# Patient Record
Sex: Male | Born: 1970 | Race: White | Hispanic: No | Marital: Married | Smoking: Former smoker
Health system: Southern US, Community
[De-identification: ages and names within clinical notes are randomized; demographics above are authoritative.]

## PROBLEM LIST (undated history)

## (undated) DIAGNOSIS — G4730 Sleep apnea, unspecified: Secondary | ICD-10-CM

## (undated) DIAGNOSIS — D689 Coagulation defect, unspecified: Secondary | ICD-10-CM

## (undated) DIAGNOSIS — Z8371 Family history of colonic polyps: Secondary | ICD-10-CM

## (undated) DIAGNOSIS — Z8619 Personal history of other infectious and parasitic diseases: Secondary | ICD-10-CM

## (undated) DIAGNOSIS — E669 Obesity, unspecified: Secondary | ICD-10-CM

## (undated) DIAGNOSIS — I82409 Acute embolism and thrombosis of unspecified deep veins of unspecified lower extremity: Secondary | ICD-10-CM

## (undated) DIAGNOSIS — G5600 Carpal tunnel syndrome, unspecified upper limb: Secondary | ICD-10-CM

## (undated) DIAGNOSIS — Z83719 Family history of colon polyps, unspecified: Secondary | ICD-10-CM

## (undated) DIAGNOSIS — K219 Gastro-esophageal reflux disease without esophagitis: Secondary | ICD-10-CM

## (undated) DIAGNOSIS — E785 Hyperlipidemia, unspecified: Secondary | ICD-10-CM

## (undated) DIAGNOSIS — K760 Fatty (change of) liver, not elsewhere classified: Secondary | ICD-10-CM

## (undated) HISTORY — DX: Sleep apnea, unspecified: G47.30

## (undated) HISTORY — DX: Family history of colon polyps, unspecified: Z83.719

## (undated) HISTORY — DX: Family history of colonic polyps: Z83.71

## (undated) HISTORY — PX: COLONOSCOPY: SHX174

## (undated) HISTORY — DX: Coagulation defect, unspecified: D68.9

## (undated) HISTORY — DX: Hyperlipidemia, unspecified: E78.5

## (undated) HISTORY — DX: Personal history of other infectious and parasitic diseases: Z86.19

## (undated) HISTORY — DX: Fatty (change of) liver, not elsewhere classified: K76.0

## (undated) HISTORY — PX: PILONIDAL CYST EXCISION: SHX744

## (undated) HISTORY — PX: LUMBAR DISC SURGERY: SHX700

## (undated) HISTORY — DX: Acute embolism and thrombosis of unspecified deep veins of unspecified lower extremity: I82.409

## (undated) HISTORY — DX: Obesity, unspecified: E66.9

## (undated) HISTORY — DX: Gastro-esophageal reflux disease without esophagitis: K21.9

## (undated) HISTORY — DX: Carpal tunnel syndrome, unspecified upper limb: G56.00

---

## 2005-02-06 ENCOUNTER — Ambulatory Visit: Payer: Self-pay | Admitting: Internal Medicine

## 2005-02-27 ENCOUNTER — Ambulatory Visit: Payer: Self-pay | Admitting: Internal Medicine

## 2006-01-23 ENCOUNTER — Encounter: Admission: RE | Admit: 2006-01-23 | Discharge: 2006-01-23 | Payer: Self-pay | Admitting: Family Medicine

## 2007-06-27 ENCOUNTER — Encounter: Admission: RE | Admit: 2007-06-27 | Discharge: 2007-06-27 | Payer: Self-pay | Admitting: Family Medicine

## 2008-03-11 IMAGING — CR DG LUMBAR SPINE COMPLETE 4+V
5 series · 5 of 5 positions shown · non-contrast
Comparison: None
COMPARISON: None

CLINICAL DATA: Mid to lower back pain for 2 months. No injury. Worse with
eating.

THORACIC SPINE - 4 VIEW
CLINICAL DATA: Same as above
LUMBAR SPINE - 5 VIEW

[t l-spine a.p.]
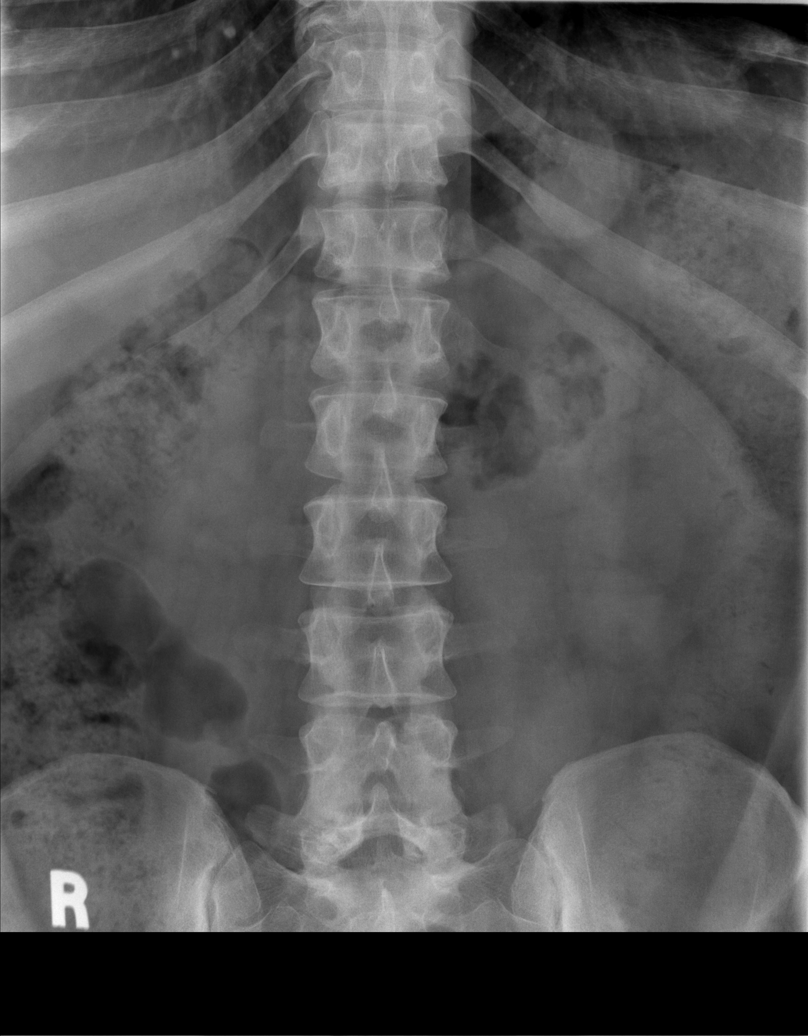

[t l-spine oblique exposure (1 of 2)]
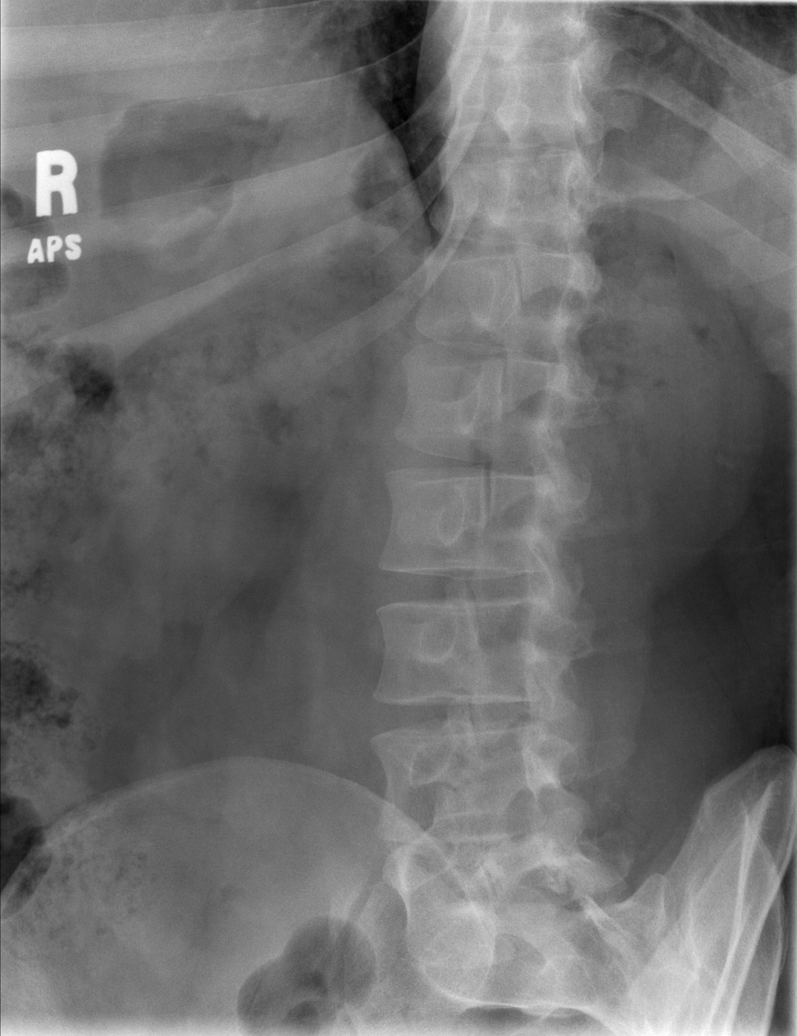

[t l-spine oblique exposure (2 of 2)]
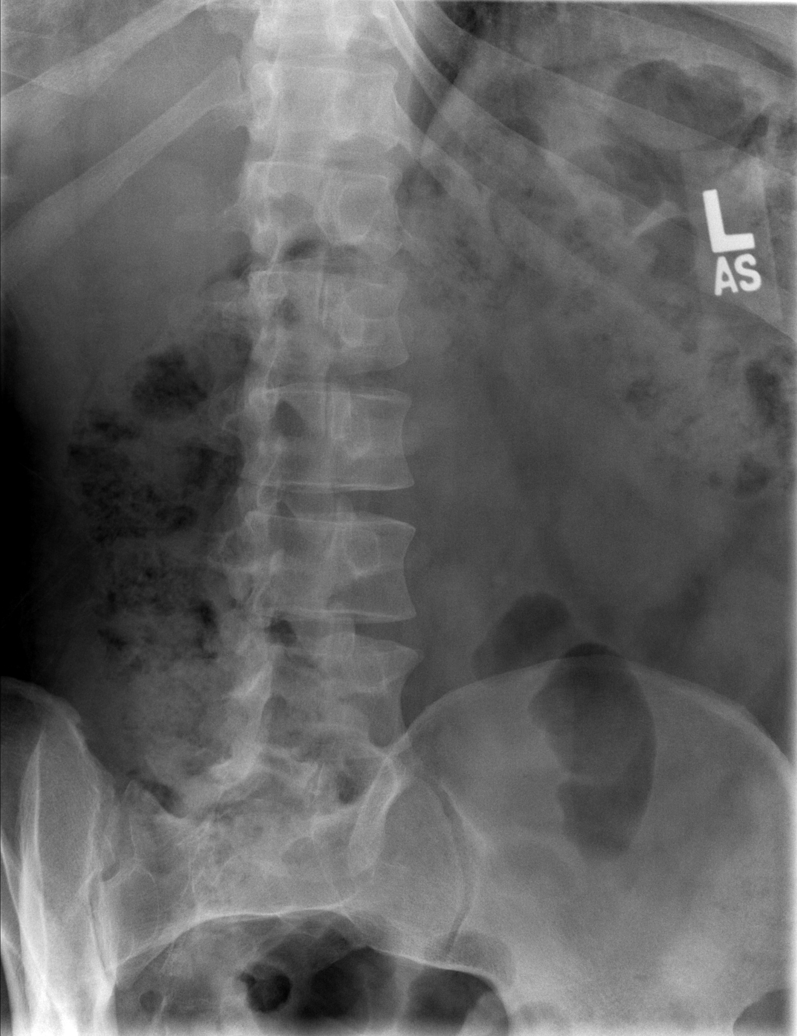

[t l-spine lat]
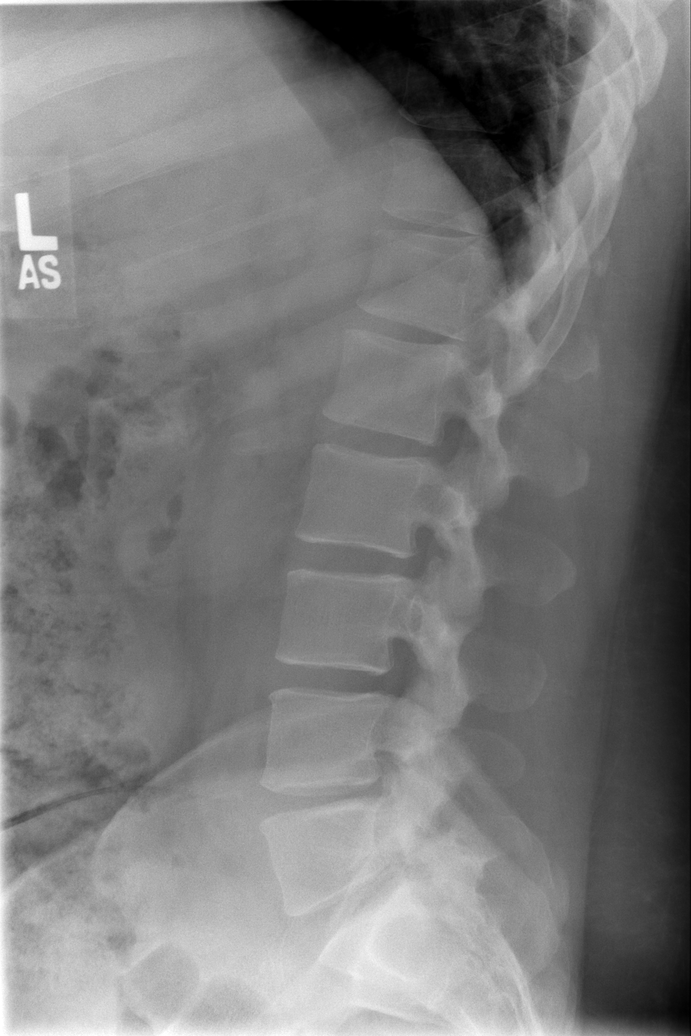

[t l-spine l5-s1 spot *]
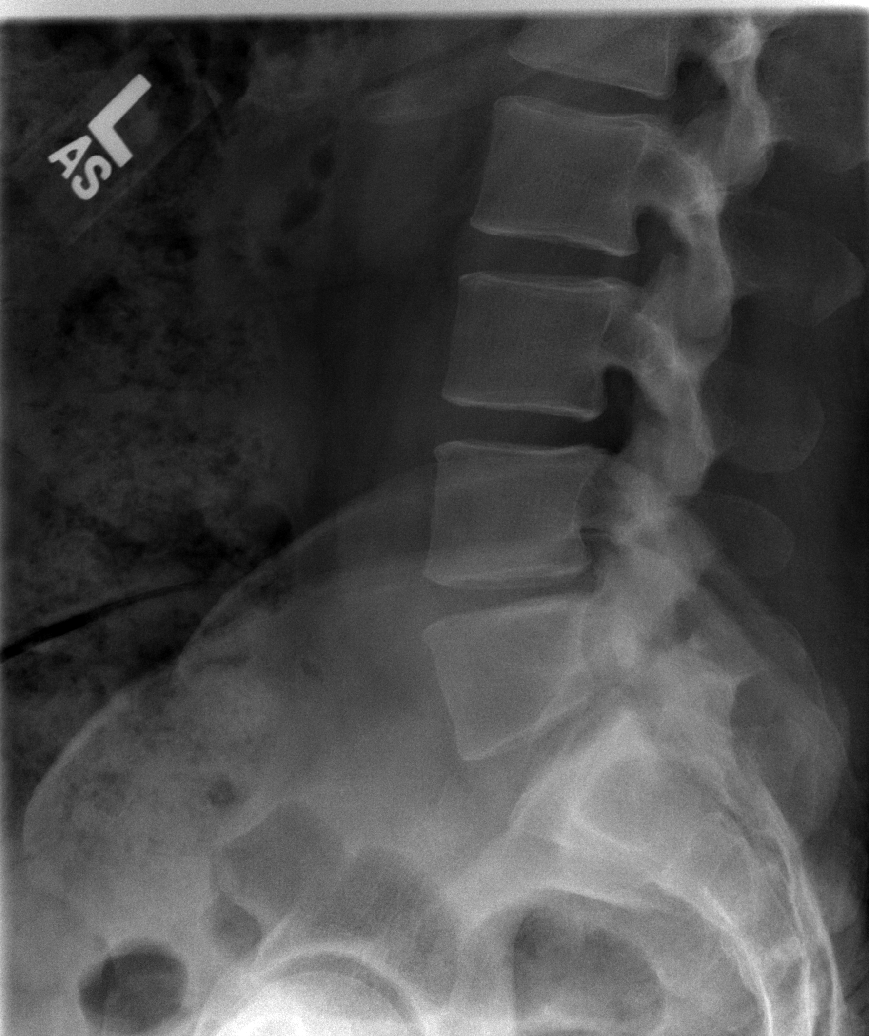

[5 of 5 positions shown; findings below may reference images not displayed]

FINDINGS: Minimal S-shaped spinal curvature. Paraspinous contours . prominent
costovertebral junctions at T9 and T10 on the right likely relate to
degenerative change.

Minimally limited evaluation of the upper thoracic spine due to patient size. No
gross compression deformity through approximately the T5 level. Maintenance of
vertebral body height from T5 through T12. Mild upper lumbar degenerative disc
disease.

IMPRESSION

Mild degenerative disc disease without acute finding. Please note this is part
of a multistudy report.  Please see remainder of report.
FINDINGS: Six nonrib-bearing lumbar type vertebral bodies. Labeled T12 through
L5 for purposes of this study. Grade 1 anterolisthesis of L5 and S1. Maintenance
of vertebral body height and alignment. Degenerative disc disease at both L4-L5
and L5-S1 is mild.

IMPRESSION

1. Degenerative disc disease and grade 1 L5-S1 anterolisthesis without acute
osseous finding.
Please note this is part of a multistudy report.  Please see remainder of
report.

## 2008-10-04 ENCOUNTER — Encounter: Admission: RE | Admit: 2008-10-04 | Discharge: 2008-10-04 | Payer: Self-pay | Admitting: Gastroenterology

## 2010-03-24 ENCOUNTER — Encounter (INDEPENDENT_AMBULATORY_CARE_PROVIDER_SITE_OTHER): Payer: Self-pay | Admitting: *Deleted

## 2010-07-12 ENCOUNTER — Encounter: Admission: RE | Admit: 2010-07-12 | Discharge: 2010-07-12 | Payer: Self-pay | Admitting: Family Medicine

## 2010-11-03 ENCOUNTER — Encounter (INDEPENDENT_AMBULATORY_CARE_PROVIDER_SITE_OTHER): Payer: Self-pay

## 2010-11-07 ENCOUNTER — Ambulatory Visit
Admission: RE | Admit: 2010-11-07 | Discharge: 2010-11-07 | Payer: Self-pay | Source: Home / Self Care | Attending: Internal Medicine | Admitting: Internal Medicine

## 2010-11-07 ENCOUNTER — Encounter (INDEPENDENT_AMBULATORY_CARE_PROVIDER_SITE_OTHER): Payer: Self-pay

## 2010-11-21 NOTE — Letter (Signed)
Summary: Colonoscopy Letter  [facility]  [address] [place], [place] [address]   Phone: [phone]  Fax: [phone]      March 24, 2010 MRN: [id number]   Jerry Eaton [address] [place], [place]  [address]   Dear Jerry Eaton,   According to your medical record, it is time for you to schedule a Colonoscopy. The American Cancer Society recommends this procedure as a method to detect early colon cancer. Patients with a family history of colon cancer, or a personal history of colon polyps or inflammatory bowel disease are at increased risk.  This letter has been generated based on the recommendations made at the time of your procedure. If you feel that in your particular situation this may no longer apply, please contact our office.  Please call our office at [phone] to schedule this appointment or to update your records at your earliest convenience.  Thank you for cooperating with [place] to provide you with the very best care possible.   Sincerely,  Wilhemina Bonito. Marina Goodell, M.D.  [facility] [phone]

## 2010-11-23 NOTE — Miscellaneous (Signed)
Summary: Lec previsit  Clinical Lists Changes  Medications: Added new medication of MOVIPREP 100 GM  SOLR (PEG-KCL-NACL-NASULF-NA ASC-C) As per prep instructions. - Signed Rx of MOVIPREP 100 GM  SOLR (PEG-KCL-NACL-NASULF-NA ASC-C) As per prep instructions.;  #1 x 0;  Signed;  Entered by: Ulis Rias RN;  Authorized by: Hilarie Fredrickson MD;  Method used: Electronically to [id number] [address] [place], [place], [place]  [address], Ph: [phone], Fax: [phone] Observations: Added new observation of NKA: T (11/07/2010 8:39)    Prescriptions: MOVIPREP 100 GM  SOLR (PEG-KCL-NACL-NASULF-NA ASC-C) As per prep instructions.  #1 x 0   Entered by:   Ulis Rias RN   Authorized by:   Hilarie Fredrickson MD   Signed by:   Ulis Rias RN on 11/07/2010   Method used:   Electronically to        [facility] [phone]* (retail)       [address] [place], [place]  [address]       Ph: [phone]       Fax: [phone]   RxID:   [id number]

## 2010-11-23 NOTE — Letter (Signed)
Summary: Diabetic Instructions  [facility]  [address] [place], [place] [address]   Phone: [phone]  Fax: [phone]    Jerry Eaton [birth year] MRN: [id number]   _  _   ORAL DIABETIC MEDICATION INSTRUCTIONS  The day before your procedure:   Take your diabetic pill as you do normally  The day of your procedure:   Do not take your diabetic pill    We will check your blood sugar levels during the admission process and again in Recovery before discharging you home  ________________________________________________________________________  _  _   INSULIN (LONG ACTING) MEDICATION INSTRUCTIONS (Lantus, NPH, 70/30, Humulin, Novolin-N)   The day before your procedure:   Take  your regular evening dose    The day of your procedure:   Do not take your morning dose    _  _   INSULIN (SHORT ACTING) MEDICATION INSTRUCTIONS (Regular, Humulog, Novolog)   The day before your procedure:   Do not take your evening dose   The day of your procedure:   Do not take your morning dose   _  _   INSULIN PUMP MEDICATION INSTRUCTIONS  We will contact the physician managing your diabetic care for written dosage instructions for the day before your procedure and the day of your procedure.  Once we have received the instructions, we will contact you.

## 2010-11-23 NOTE — Letter (Signed)
Summary: [facility] Instructions  [facility]  [address] [place], [place] [address]   Phone: [phone]  Fax: [phone]       Jerry Eaton    June 13, 1971    MRN: [id number]        Procedure Day Dorna Bloom:  Farrell Ours  11/24/10     Arrival Time:  9:00AM     Procedure Time:  10:00AM     [facility] of Procedure:                    [facility]                      PREPARATION FOR COLONOSCOPY WITH MOVIPREP   Starting 5 days prior to your procedure 11/19/10 do not eat nuts, seeds, popcorn, corn, beans, peas,  salads, or any raw vegetables.  Do not take any fiber supplements (e.g. Metamucil, Citrucel, and Benefiber).  THE DAY BEFORE YOUR PROCEDURE         DATE: 2/212  DAY: THURSDAY  1.  Drink clear liquids the entire day-NO SOLID FOOD  2.  Do not drink anything colored red or purple.  Avoid juices with pulp.  No orange juice.  3.  Drink at least 64 oz. (8 glasses) of fluid/clear liquids during the day to prevent dehydration and help the prep work efficiently.  CLEAR LIQUIDS INCLUDE: Water Jello Ice Popsicles Tea (sugar ok, no milk/cream) Powdered fruit flavored drinks Coffee (sugar ok, no milk/cream) Gatorade Juice: apple, white grape, white cranberry  Lemonade Clear bullion, consomm, broth Carbonated beverages (any kind) Strained chicken noodle soup Hard Candy                             4.  In the morning, mix first dose of MoviPrep solution:    Empty 1 Pouch A and 1 Pouch B into the disposable container    Add lukewarm drinking water to the top line of the container. Mix to dissolve    Refrigerate (mixed solution should be used within 24 hrs)  5.  Begin drinking the prep at 5:00 p.m. The MoviPrep container is divided by 4 marks.   Every 15 minutes drink the solution down to the next mark (approximately 8 oz) until the full liter is complete.   6.  Follow completed prep with 16 oz of clear liquid of your choice (Nothing red  or purple).  Continue to drink clear liquids until bedtime.  7.  Before going to bed, mix second dose of MoviPrep solution:    Empty 1 Pouch A and 1 Pouch B into the disposable container    Add lukewarm drinking water to the top line of the container. Mix to dissolve    Refrigerate  THE DAY OF YOUR PROCEDURE      DATE: 11/24/10   DAY: FRIDAY  Beginning at 5:00AM (5 hours before procedure):         1. Every 15 minutes, drink the solution down to the next mark (approx 8 oz) until the full liter is complete.  2. Follow completed prep with 16 oz. of clear liquid of your choice.    3. You may drink clear liquids until 8:00AM (2 HOURS BEFORE PROCEDURE).   MEDICATION INSTRUCTIONS  Unless otherwise instructed, you should take regular prescription medications with a small sip of water   as early as possible the morning of  your procedure.         OTHER INSTRUCTIONS  You will need a responsible adult at least 38 years of age to accompany you and drive you home.   This person must remain in the waiting room during your procedure.  Wear loose fitting clothing that is easily removed.  Leave jewelry and other valuables at home.  However, you may wish to bring a book to read or  an iPod/MP3 player to listen to music as you wait for your procedure to start.  Remove all body piercing jewelry and leave at home.  Total time from sign-in until discharge is approximately 2-3 hours.  You should go home directly after your procedure and rest.  You can resume normal activities the  day after your procedure.  The day of your procedure you should not:   Drive   Make legal decisions   Operate machinery   Drink alcohol   Return to work  You will receive specific instructions about eating, activities and medications before you leave.    The above instructions have been reviewed and explained to me by   Ulis Rias RN  November 07, 2010 8:57 AM     I fully understand and can  verbalize these instructions _____________________________ Date _________

## 2010-11-24 ENCOUNTER — Other Ambulatory Visit (AMBULATORY_SURGERY_CENTER): Payer: BC Managed Care – PPO | Admitting: Internal Medicine

## 2010-11-24 ENCOUNTER — Ambulatory Visit: Admit: 2010-11-24 | Payer: Self-pay | Admitting: Internal Medicine

## 2010-11-24 ENCOUNTER — Other Ambulatory Visit: Payer: Self-pay | Admitting: Internal Medicine

## 2010-11-24 DIAGNOSIS — Z86010 Personal history of colonic polyps: Secondary | ICD-10-CM

## 2010-11-24 DIAGNOSIS — D126 Benign neoplasm of colon, unspecified: Secondary | ICD-10-CM

## 2010-11-24 DIAGNOSIS — Z1211 Encounter for screening for malignant neoplasm of colon: Secondary | ICD-10-CM

## 2010-11-24 DIAGNOSIS — K648 Other hemorrhoids: Secondary | ICD-10-CM

## 2010-11-24 LAB — GLUCOSE, CAPILLARY: Glucose-Capillary: 164 mg/dL — ABNORMAL HIGH (ref 70–99)

## 2010-11-28 ENCOUNTER — Encounter: Payer: Self-pay | Admitting: Internal Medicine

## 2010-12-07 NOTE — Letter (Signed)
Summary: Patient Notice- Polyp Results  [facility]  [address] [place], [place] [address]   Phone: [phone]  Fax: [phone]        November 28, 2010 MRN: [id number]    Jerry Eaton [address] [place], [place]  [address]    Dear [name],  I am pleased to inform you that the colon polyp(s) removed during your recent colonoscopy was (were) found to be benign (no cancer detected) upon pathologic examination.  I recommend you have a repeat colonoscopy examination in 5 years to look for recurrent polyps, as having colon polyps increases your risk for having recurrent polyps or even colon cancer in the future.  Should you develop new or worsening symptoms of abdominal pain, bowel habit changes or bleeding from the rectum or bowels, please schedule an evaluation with either your primary care physician or with me.  Additional information/recommendations:  __ No further action with gastroenterology is needed at this time. Please      follow-up with your primary care physician for your other healthcare      needs.    Please call us if you are having persistent problems or have questions about your condition that have not been fully answered at this time.  Sincerely,  Hilarie Fredrickson MD  This letter has been electronically signed by your physician.  Appended Document: Patient Notice- Polyp Results LETTER MAILED

## 2010-12-07 NOTE — Procedures (Signed)
Summary: Colonoscopy   Colonoscopy  Procedure date:  11/24/2010  Findings:      Location:  [facility].   COLONOSCOPY PROCEDURE REPORT  PATIENT:  Jerry Eaton, Jerry Eaton  MR#:  [id number] BIRTHDATE:   1970-11-07, 39 yrs. old   GENDER:   male ENDOSCOPIST:   Wilhemina Bonito. Eda Keys, MD REF. BY: Surveillance Program Recall, PROCEDURE DATE:  11/24/2010 PROCEDURE:  Colonoscopy with snare polypectomy x 3 ASA CLASS:   Class II INDICATIONS: history of pre-cancerous (adenomatous) colon polyps, surveillance and high-risk screening ; INDEX EXAM 02-2005 W/ SMALL TA'S MEDICATIONS:    Fentanyl 75 mcg IV, Versed 9 mg IV, Benadryl 25 mg IV  DESCRIPTION OF PROCEDURE:   After the risks benefits and alternatives of the procedure were thoroughly explained, informed consent was obtained.  Digital rectal exam was performed and revealed no abnormalities.   The LB CF-[id number] endoscope was introduced through the anus and advanced to the cecum, which was identified by both the appendix and ileocecal valve, without limitations.Time to cecum = 2:33 min.  The quality of the prep was excellent, using MoviPrep.  The instrument was then slowly withdrawn (time = 12:17 min) as the colon was fully examined. <<PROCEDUREIMAGES>>          <<OLD IMAGES>>  FINDINGS:  Three polyps (2mm, 5mm, and 7mm) were found in the descending colon. Polyps were snared without cautery. Retrieval was successful.  Otherwise normal colonoscopy without other polyps, masses, vascular ectasias, or inflammatory changes.   Retroflexed views in the rectum revealed small internal hemorrhoids.    The scope was then withdrawn from the patient and the procedure completed.  COMPLICATIONS:   None ENDOSCOPIC IMPRESSION:  1) Three polyps in the descending colon - removed  2) Otherwise nl colonoscopy   3) Internal hemorrhoids RECOMMENDATIONS:  1) Follow up colonoscopy in 3 years if all polyps adenomatous; otherwise 5  years  _______________________________ Wilhemina Bonito. Eda Keys, MD  CC: The Patient; Carilyn Goodpasture, [facility]     Appended Document: Colonoscopy recall     Procedures Next Due Date:    Colonoscopy: 11/2015

## 2011-03-14 ENCOUNTER — Other Ambulatory Visit: Payer: Self-pay | Admitting: Family Medicine

## 2011-03-14 DIAGNOSIS — K760 Fatty (change of) liver, not elsewhere classified: Secondary | ICD-10-CM

## 2011-04-09 ENCOUNTER — Other Ambulatory Visit: Payer: BC Managed Care – PPO

## 2011-04-10 ENCOUNTER — Ambulatory Visit
Admission: RE | Admit: 2011-04-10 | Discharge: 2011-04-10 | Disposition: A | Discharge status: Home / Self Care | Payer: BC Managed Care – PPO | Source: Ambulatory Visit | Attending: Family Medicine | Admitting: Family Medicine

## 2011-04-10 DIAGNOSIS — K760 Fatty (change of) liver, not elsewhere classified: Secondary | ICD-10-CM

## 2011-05-07 ENCOUNTER — Other Ambulatory Visit: Payer: BC Managed Care – PPO

## 2011-10-08 ENCOUNTER — Encounter: Payer: Self-pay | Admitting: Family Medicine

## 2011-10-08 ENCOUNTER — Ambulatory Visit (INDEPENDENT_AMBULATORY_CARE_PROVIDER_SITE_OTHER): Payer: BC Managed Care – PPO | Admitting: Family Medicine

## 2011-10-08 VITALS — BP 116/88 | HR 80 | Temp 98.0°F | Ht 72.0 in | Wt 334.0 lb

## 2011-10-08 DIAGNOSIS — R739 Hyperglycemia, unspecified: Secondary | ICD-10-CM

## 2011-10-08 DIAGNOSIS — R7989 Other specified abnormal findings of blood chemistry: Secondary | ICD-10-CM

## 2011-10-08 DIAGNOSIS — E119 Type 2 diabetes mellitus without complications: Secondary | ICD-10-CM

## 2011-10-08 DIAGNOSIS — M79609 Pain in unspecified limb: Secondary | ICD-10-CM

## 2011-10-08 DIAGNOSIS — M79673 Pain in unspecified foot: Secondary | ICD-10-CM

## 2011-10-08 DIAGNOSIS — Z800 Family history of malignant neoplasm of digestive organs: Secondary | ICD-10-CM

## 2011-10-08 DIAGNOSIS — Z8042 Family history of malignant neoplasm of prostate: Secondary | ICD-10-CM

## 2011-10-08 DIAGNOSIS — R7309 Other abnormal glucose: Secondary | ICD-10-CM

## 2011-10-08 LAB — HEMOGLOBIN A1C: Hgb A1c MFr Bld: 9.7 % — ABNORMAL HIGH (ref 4.6–6.5)

## 2011-10-08 NOTE — Progress Notes (Signed)
New patient  DM2, requesting records, due for labs.  Intolerant of januvia but tolerates metformin.  +FH.    Tingling in feet, it may have a positional component, some worse when sitting.  Better with activity.  Prev with EMG neg.  Requesting records.    Back pain.  Occ ibuprofen (a few times a week) for back pain, worse if sedentary.  H/o improvement after lumbar disk surgery.    GERD, controlled, diet triggers known.  Zantac prn, a few times a week use.    H/o prev U/s for inc in LFTs.  Severe diffuse hepatic steatosis and/or hepatocellular disease. No focal hepatic parenchymal abnormalities.  No evidence of cholelithiasis or cholecystitis. No biliary ductal dilation.    PMH and SH reviewed  ROS: See HPI, otherwise noncontributory.  Meds, vitals, and allergies reviewed.   GEN: nad, alert and oriented, overweight HEENT: mucous membranes moist NECK: supple w/o LA CV: rrr.  PULM: ctab, no inc wob ABD: soft, +bs EXT: no edema SKIN: no acute rash Normal sensation on feet, no weakness in BLE, subjective pain/burning on toes #1-4 B, loss of transverse arch on standing B

## 2011-10-08 NOTE — Patient Instructions (Addendum)
I would get some soft, inexpensive arch support inserts and see if that helps.   We'll request your records.  You can get your results through our phone system.  Follow the instructions on the blue card. I would look at the american diabetes association website about diet and exercise.   Plan on a OV in 3 months.   Call with concerns in the meantime.

## 2011-10-09 MED ORDER — METFORMIN HCL 500 MG PO TABS: 500.0000 mg | ORAL_TABLET | Freq: Two times a day (BID) | ORAL | Status: DC

## 2011-10-10 ENCOUNTER — Encounter: Payer: Self-pay | Admitting: Family Medicine

## 2011-10-10 DIAGNOSIS — M79673 Pain in unspecified foot: Secondary | ICD-10-CM | POA: Insufficient documentation

## 2011-10-10 DIAGNOSIS — R7989 Other specified abnormal findings of blood chemistry: Secondary | ICD-10-CM | POA: Insufficient documentation

## 2011-10-10 DIAGNOSIS — Z8042 Family history of malignant neoplasm of prostate: Secondary | ICD-10-CM | POA: Insufficient documentation

## 2011-10-10 DIAGNOSIS — E1140 Type 2 diabetes mellitus with diabetic neuropathy, unspecified: Secondary | ICD-10-CM | POA: Insufficient documentation

## 2011-10-10 DIAGNOSIS — Z800 Family history of malignant neoplasm of digestive organs: Secondary | ICD-10-CM | POA: Insufficient documentation

## 2011-10-10 NOTE — Assessment & Plan Note (Signed)
Requesting records.  Try inserts in meantime given loss of transverse arch.

## 2011-10-10 NOTE — Assessment & Plan Note (Signed)
Requesting records.  D/w pt about losing weight and fatty liver.

## 2011-10-10 NOTE — Assessment & Plan Note (Signed)
Requesting records.  S/p colonoscopy.

## 2011-10-10 NOTE — Assessment & Plan Note (Signed)
No indication for screening at this age.

## 2011-10-10 NOTE — Assessment & Plan Note (Signed)
Requesting records.  See notes on labs.   

## 2011-12-21 ENCOUNTER — Ambulatory Visit (INDEPENDENT_AMBULATORY_CARE_PROVIDER_SITE_OTHER): Payer: BC Managed Care – PPO | Admitting: Family Medicine

## 2011-12-21 ENCOUNTER — Encounter: Payer: Self-pay | Admitting: Family Medicine

## 2011-12-21 DIAGNOSIS — G629 Polyneuropathy, unspecified: Secondary | ICD-10-CM

## 2011-12-21 DIAGNOSIS — E1140 Type 2 diabetes mellitus with diabetic neuropathy, unspecified: Secondary | ICD-10-CM

## 2011-12-21 DIAGNOSIS — E1142 Type 2 diabetes mellitus with diabetic polyneuropathy: Secondary | ICD-10-CM

## 2011-12-21 DIAGNOSIS — E119 Type 2 diabetes mellitus without complications: Secondary | ICD-10-CM

## 2011-12-21 DIAGNOSIS — E1149 Type 2 diabetes mellitus with other diabetic neurological complication: Secondary | ICD-10-CM

## 2011-12-21 DIAGNOSIS — G589 Mononeuropathy, unspecified: Secondary | ICD-10-CM

## 2011-12-21 LAB — COMPREHENSIVE METABOLIC PANEL
ALT: 94 U/L — ABNORMAL HIGH (ref 0–53)
AST: 72 U/L — ABNORMAL HIGH (ref 0–37)
Alkaline Phosphatase: 60 U/L (ref 39–117)
Creat: 0.68 mg/dL (ref 0.50–1.35)
Sodium: 139 mEq/L (ref 135–145)
Total Bilirubin: 0.4 mg/dL (ref 0.3–1.2)
Total Protein: 7 g/dL (ref 6.0–8.3)

## 2011-12-21 LAB — HEMOGLOBIN A1C: Hgb A1c MFr Bld: 8.2 % — ABNORMAL HIGH (ref <5.7)

## 2011-12-21 LAB — VITAMIN B12: Vitamin B-12: 569 pg/mL (ref 211–911)

## 2011-12-21 MED ORDER — GABAPENTIN 300 MG PO CAPS: ORAL_CAPSULE | ORAL | Status: DC

## 2011-12-21 NOTE — Progress Notes (Signed)
DM2.  Down 9 lbs. Back in the gym recently but leg pain has increased recently.  Can be in the feet, calves, and occ upper legs.  It can feel like they are on fire or cramping.  He usually has about the same severity of pain in each leg at the same time though the R can be worse than the L.  He now has tingling in fingers, 1st and 2nd tips B.  Sugar has been lower than prev, 149 this AM.  On metformin 2 a day.  No trauma to trigger the pain.    Was seen at [facility], given unknown med/abx injection, had a rash, see instructions.    PMH and SH reviewed  ROS: See HPI, otherwise noncontributory.  Meds, vitals, and allergies reviewed.    nad ncat Overweight Mmm rrr ctab abd soft  Normal sensation in the hands.  Normal DP and radial pulses.  Diabetic foot exam: Normal inspection No skin breakdown No calluses  Normal DP pulses Dec sensation to light touch and monofilament on the plantar side of R foot, normal on L foot Nails normal

## 2011-12-21 NOTE — Patient Instructions (Addendum)
Call the urgent care and find out what antibiotic you were given.  See if they gave you lidocaine with the injection.   Start with 1 gabapentin at night and then gradually increase up to 2 and then 3 a day.  See if that helps the pain.   You can get your results through our phone system.  Follow the instructions on the blue card.

## 2011-12-23 ENCOUNTER — Encounter: Payer: Self-pay | Admitting: Family Medicine

## 2011-12-23 NOTE — Assessment & Plan Note (Signed)
A1c improved, continue weight loss and diet for now.  Recheck A1c in 3 months with OV a few days later.  Refer to neuro for neuropathy eval and nerve conductions.  Other labs unremarkable except for LFTS c/w fatty liver. Try gabapentin for pain in meantime.  >25 min spent with face to face with patient, >50% counseling and/or coordinating care

## 2011-12-23 NOTE — Progress Notes (Signed)
Addended by: Lars Mage on: 12/23/2011 11:10 PM   Modules accepted: Orders

## 2011-12-27 ENCOUNTER — Encounter: Payer: Self-pay | Admitting: Neurology

## 2012-01-07 ENCOUNTER — Ambulatory Visit: Payer: BC Managed Care – PPO | Admitting: Family Medicine

## 2012-01-14 ENCOUNTER — Other Ambulatory Visit: Payer: BC Managed Care – PPO

## 2012-01-21 ENCOUNTER — Encounter: Payer: BC Managed Care – PPO | Admitting: Family Medicine

## 2012-02-15 ENCOUNTER — Encounter: Payer: Self-pay | Admitting: Neurology

## 2012-02-15 ENCOUNTER — Ambulatory Visit (INDEPENDENT_AMBULATORY_CARE_PROVIDER_SITE_OTHER): Payer: BC Managed Care – PPO | Admitting: Neurology

## 2012-02-15 ENCOUNTER — Other Ambulatory Visit (INDEPENDENT_AMBULATORY_CARE_PROVIDER_SITE_OTHER): Payer: BC Managed Care – PPO

## 2012-02-15 VITALS — BP 122/80 | HR 76 | Wt 321.0 lb

## 2012-02-15 DIAGNOSIS — R209 Unspecified disturbances of skin sensation: Secondary | ICD-10-CM

## 2012-02-15 DIAGNOSIS — R200 Anesthesia of skin: Secondary | ICD-10-CM

## 2012-02-15 DIAGNOSIS — R202 Paresthesia of skin: Secondary | ICD-10-CM

## 2012-02-15 LAB — SEDIMENTATION RATE: Sed Rate: 18 mm/hr (ref 0–22)

## 2012-02-15 NOTE — Patient Instructions (Signed)
Go to the basement to have your labs drawn today.  Your appointment for the nerve conduction studies is scheduled for June 3rd at 8:45am at [facility] [address] [place], [place] [phone].

## 2012-02-15 NOTE — Progress Notes (Signed)
Dear Dr. Para March,  Thank you for having me see Jerry Eaton in consultation today at [facility] for his problem with neuropathy.  As you may recall, he is a 41 y.o. year old male with a history of diabetes type II, with a recent HbA1c of 8, who has been complaining of pain in the feet for last two years.  Tingling has gotten better in the feet.  Feels stiff in the legs.  Feels pain is recently less.  Stopped alcohol recently.  Used be tingling in the legs.  Interestingly started in the lower legs and not the feet.  NCS was normal - about 1 year ago.  Tingling in the finger tips.  No workingwith heavy metals.  Developed diabetes 2-3 years ago.  Gabapentin helps a bit, but makes him sleepy.    No change appetite.  No change in urination.  No lightheadedness.    Past Medical History  Diagnosis Date  . Diabetes mellitus   . GERD (gastroesophageal reflux disease)   . Hyperlipidemia   . History of chicken pox   . Family history of polyps in the colon   . Fatty liver     Past Surgical History  Procedure Date  . Lumbar disc surgery     L4/L5  . Colonoscopy     2012  - lumbar disk surgery, 2003, shooting pain down the left leg. Surgery helped.  Done in [place].    History   Social History  . Marital Status: Single    Spouse Name: N/A    Number of Children: N/A  . Years of Education: N/A   Social History Main Topics  . Smoking status: Current Everyday Smoker -- 1.0 packs/day for 20 years    Types: Cigarettes  . Smokeless tobacco: Never Used  . Alcohol Use: Yes     20 drinks per week as of 2012, as of 4/13 drinks rarely  . Drug Use: No  . Sexually Active: None   Other Topics Concern  . None   Social History Narrative   Single[employer] grad[employer] Merchandiser, retail with [employer], travels for work    Family History  Problem Relation Age of Onset  . Heart disease Mother   . Hypertension Mother   . Prostate cancer Mother   . Diabetes Father   . Hyperlipidemia Brother     . Colon cancer Paternal Uncle   - no clear neuropathy in the family, other than possible diabetic neuropathy.  Current Outpatient Prescriptions on File Prior to Visit  Medication Sig Dispense Refill  . cyanocobalamin 1000 MCG tablet Take 100 mcg by mouth daily.        . metFORMIN (GLUCOPHAGE) 500 MG tablet Take 1 tablet (500 mg total) by mouth 2 (two) times daily with a meal.  180 tablet  3  . Multiple Vitamin (MULTIVITAMIN) tablet Take 1 tablet by mouth daily.        . ranitidine (ZANTAC) 150 MG tablet Take 150 mg by mouth as needed.        . gabapentin (NEURONTIN) 300 MG capsule Take 1 up to 3 times a day  90 capsule  3  . ibuprofen (ADVIL,MOTRIN) 800 MG tablet Take 800 mg by mouth every 8 (eight) hours as needed.          Allergies  Allergen Reactions  . Chantix (Varenicline Tartrate)     irritable  . Crestor (Rosuvastatin Calcium)     myalgia  . Januvia (Sitagliptin Phosphate)  Inc in LFTs. Occurred with janumet, but prev tolerated metformin alone  . Lipitor (Atorvastatin Calcium)     myalgia  . Lidocaine Rash    Questionable      ROS:  13 systems were reviewed and are notable for chronic back pain.  All other review of systems are unremarkable.   Examination:  Filed Vitals:   02/15/12 0827  BP: 122/80  Pulse: 76  Weight: 321 lb (145.605 kg)     In general, obese young man.  Cardiovascular: The patient has a regular rate and rhythm and no carotid bruits.  Fundoscopy:  Disks are flat. Vessel caliber within normal limits.  Mental status:   The patient is oriented to person, place and time. Recent and remote memory are intact. Attention span and concentration are normal. Language including repetition, naming, following commands are intact. Fund of knowledge of current and historical events, as well as vocabulary are normal.  Cranial Nerves: Pupils are equally round and reactive to light. Visual fields full to confrontation. Extraocular movements are intact  without nystagmus. Facial sensation and muscles of mastication are intact. Muscles of facial expression are symmetric. Hearing intact to bilateral finger rub. Tongue protrusion, uvula, palate midline.  Shoulder shrug intact  Motor:  The patient has normal bulk and tone, no pronator drift.  There are no adventitious movements.  5/5 muscle strength bilaterally.  Reflexes:   Biceps  Triceps Brachioradialis Knee Ankle  Right 2+  2+  2+   1+ 2+  Left  2+  2+  2+   1+ 2+  Toes down  Coordination:  Normal finger to nose.  No dysdiadokinesia.  Sensation is decreased to temperature to wrist/mid forearm, upper calf bilaterally.  Vibration and position relatively intact.  Gait and Station are normal.  Tandem gait is intact.  Romberg is negative  Extremities:  trophic changes in his feet.  Impression/Recs: 1.  Likely neuropathy - Predominance of small fiber involvement.  I will get a NCS of his RUE and RLE given how quickly the symptoms started.  I have sent off extended PN labs as well.  However, I expect that this is related to his diabetes.  If pain becomes an issue then he will call me and we can try Elavil and then Lyrica.   We will see the patient back in 3 months.  Thank you for having [place] see Jerry Eaton in consultation.  Feel free to contact me with any questions.  Lupita Raider Modesto Charon, MD [facility], [place] [address] [place], [place] [address] Phone: [phone] Fax: [phone].

## 2012-02-16 LAB — C-REACTIVE PROTEIN: CRP: 1.25 mg/dL — ABNORMAL HIGH (ref <0.60)

## 2012-02-18 LAB — COPPER, SERUM: Copper: 93 ug/dL (ref 70–175)

## 2012-02-18 LAB — ANA: Anti Nuclear Antibody(ANA): NEGATIVE

## 2012-02-19 LAB — PROTEIN ELECTROPHORESIS, SERUM
Alpha-1-Globulin: 3.6 % (ref 2.9–4.9)
Alpha-2-Globulin: 10.8 % (ref 7.1–11.8)
Gamma Globulin: 16.5 % (ref 11.1–18.8)
Total Protein, Serum Electrophoresis: 7.2 g/dL (ref 6.0–8.3)

## 2012-02-20 LAB — VITAMIN E: Vitamin E (Alpha Tocopherol): 17.8 mg/L (ref 5.7–19.9)

## 2012-02-20 LAB — VITAMIN B6: Vitamin B6: 38.3 ng/mL — ABNORMAL HIGH (ref 2.1–21.7)

## 2012-02-23 LAB — VITAMIN B1: Vitamin B1 (Thiamine): 11 nmol/L (ref 8–30)

## 2012-02-27 ENCOUNTER — Ambulatory Visit: Payer: BC Managed Care – PPO | Admitting: Family Medicine

## 2012-02-27 DIAGNOSIS — Z0289 Encounter for other administrative examinations: Secondary | ICD-10-CM

## 2012-03-03 ENCOUNTER — Other Ambulatory Visit: Payer: BC Managed Care – PPO

## 2012-03-03 ENCOUNTER — Other Ambulatory Visit: Payer: Self-pay

## 2012-03-03 DIAGNOSIS — G609 Hereditary and idiopathic neuropathy, unspecified: Secondary | ICD-10-CM

## 2012-03-04 ENCOUNTER — Telehealth: Payer: Self-pay | Admitting: Neurology

## 2012-03-04 NOTE — Telephone Encounter (Signed)
Spoke with the patient. Information given re: lab work normal. No additional questions or concerns voiced at this time.

## 2012-03-04 NOTE — Telephone Encounter (Signed)
Message copied by Benay Spice on Tue Mar 04, 2012  1:53 PM ------      Message from: Milas Gain      Created: Mon Mar 03, 2012 11:28 AM       tiffany -- Let Mr. Gracie know that he lab work was ok.  No obvious other cause for her neuropathy other than his diabetes.

## 2012-03-06 LAB — HEAVY METALS SCREEN, URINE
Arsenic, 24H Ur: 24 mcg/L (ref <81)
Mercury 24 Hr Urine: <2 mcg/L (ref <21)

## 2012-03-10 ENCOUNTER — Telehealth: Payer: Self-pay | Admitting: Neurology

## 2012-03-10 NOTE — Telephone Encounter (Signed)
Message copied by Benay Spice on Mon Mar 10, 2012 10:45 AM ------      Message from: Milas Gain      Created: Fri Mar 07, 2012 11:05 AM       Let Jerry Eaton know his screening test for heavy metals that may contribute to a neuropathy was negative.

## 2012-03-10 NOTE — Telephone Encounter (Signed)
Called and spoke with the patient. Informed all lab work was normal.

## 2012-03-24 ENCOUNTER — Other Ambulatory Visit: Payer: BC Managed Care – PPO

## 2012-03-25 ENCOUNTER — Other Ambulatory Visit: Payer: BC Managed Care – PPO

## 2012-03-28 ENCOUNTER — Telehealth: Payer: Self-pay | Admitting: Family Medicine

## 2012-03-28 ENCOUNTER — Encounter: Payer: BC Managed Care – PPO | Admitting: Family Medicine

## 2012-03-28 ENCOUNTER — Other Ambulatory Visit (INDEPENDENT_AMBULATORY_CARE_PROVIDER_SITE_OTHER): Payer: BC Managed Care – PPO

## 2012-03-28 DIAGNOSIS — E119 Type 2 diabetes mellitus without complications: Secondary | ICD-10-CM

## 2012-03-28 LAB — HEMOGLOBIN A1C: Hgb A1c MFr Bld: 7.9 % — ABNORMAL HIGH (ref 4.6–6.5)

## 2012-03-28 MED ORDER — METFORMIN HCL 500 MG PO TABS: 500.0000 mg | ORAL_TABLET | Freq: Two times a day (BID) | ORAL | Status: DC

## 2012-03-28 NOTE — Telephone Encounter (Signed)
Pt is currently on Meformin and is needing an rx for some more meds. He uses [facility] on [address].

## 2012-03-28 NOTE — Telephone Encounter (Signed)
Sent!

## 2012-03-31 ENCOUNTER — Encounter: Payer: Self-pay | Admitting: *Deleted

## 2012-03-31 ENCOUNTER — Encounter: Payer: BC Managed Care – PPO | Admitting: Family Medicine

## 2012-05-12 ENCOUNTER — Other Ambulatory Visit: Payer: Self-pay | Admitting: Family Medicine

## 2012-05-12 DIAGNOSIS — E119 Type 2 diabetes mellitus without complications: Secondary | ICD-10-CM

## 2012-05-16 ENCOUNTER — Other Ambulatory Visit (INDEPENDENT_AMBULATORY_CARE_PROVIDER_SITE_OTHER): Payer: BC Managed Care – PPO

## 2012-05-16 DIAGNOSIS — E119 Type 2 diabetes mellitus without complications: Secondary | ICD-10-CM

## 2012-05-16 LAB — COMPREHENSIVE METABOLIC PANEL
Albumin: 3.9 g/dL (ref 3.5–5.2)
CO2: 26 mEq/L (ref 19–32)
Calcium: 9.1 mg/dL (ref 8.4–10.5)
Chloride: 104 mEq/L (ref 96–112)
GFR: 143.12 mL/min (ref >60.00)
Glucose, Bld: 162 mg/dL — ABNORMAL HIGH (ref 70–99)
Potassium: 4.6 mEq/L (ref 3.5–5.1)
Sodium: 137 mEq/L (ref 135–145)
Total Bilirubin: 0.8 mg/dL (ref 0.3–1.2)
Total Protein: 7.4 g/dL (ref 6.0–8.3)

## 2012-05-16 LAB — HEMOGLOBIN A1C: Hgb A1c MFr Bld: 7.2 % — ABNORMAL HIGH (ref 4.6–6.5)

## 2012-05-16 LAB — LIPID PANEL: VLDL: 17.2 mg/dL (ref 0.0–40.0)

## 2012-05-23 ENCOUNTER — Encounter: Payer: Self-pay | Admitting: Family Medicine

## 2012-05-23 ENCOUNTER — Ambulatory Visit (INDEPENDENT_AMBULATORY_CARE_PROVIDER_SITE_OTHER): Payer: BC Managed Care – PPO | Admitting: Family Medicine

## 2012-05-23 VITALS — BP 140/92 | HR 78 | Temp 98.8°F | Ht 72.0 in | Wt 317.8 lb

## 2012-05-23 DIAGNOSIS — M722 Plantar fascial fibromatosis: Secondary | ICD-10-CM

## 2012-05-23 DIAGNOSIS — E1140 Type 2 diabetes mellitus with diabetic neuropathy, unspecified: Secondary | ICD-10-CM

## 2012-05-23 DIAGNOSIS — Z23 Encounter for immunization: Secondary | ICD-10-CM

## 2012-05-23 DIAGNOSIS — F17200 Nicotine dependence, unspecified, uncomplicated: Secondary | ICD-10-CM

## 2012-05-23 DIAGNOSIS — Z0000 Encounter for general adult medical examination without abnormal findings: Secondary | ICD-10-CM

## 2012-05-23 DIAGNOSIS — E785 Hyperlipidemia, unspecified: Secondary | ICD-10-CM

## 2012-05-23 DIAGNOSIS — E119 Type 2 diabetes mellitus without complications: Secondary | ICD-10-CM

## 2012-05-23 MED ORDER — BUPROPION HCL ER (SR) 150 MG PO TB12: 150.0000 mg | ORAL_TABLET | Freq: Two times a day (BID) | ORAL | Status: DC

## 2012-05-23 NOTE — Patient Instructions (Addendum)
I would get a flu shot each fall.   Call back 1 month before the trip to [place].   Check on the typhoid vaccine date.   Try the wellbutrin, start with 1 a day for 3 days and then increase to 1 twice a day.  If your mood changes, then stop the medicine and notify me.  See Shirlee Limerick about your referral before you leave today. Recheck A1c in 3 months and then come see me after that.

## 2012-05-23 NOTE — Progress Notes (Signed)
CPE- See plan.  Routine anticipatory guidance given to patient.  See health maintenance. Tetanus shot.  Due.   Flu. Done yearly.  Living will.  Discussed with patient.  He doesn't have one.   Encouraged.   Travelling to [place] in late fall. Prev took malarone.  He's had yellow fever, HAV and HBV vaccines.  He'll call back before the trip.    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   Labs d/w pt.  A1c 7.2.    B plantar fascia pain, pain with 1st step in AM or after sitting.  Better with walking.  No trauma.    Smoking.  Interested in wellbutrin.  Has quit prev.  No h/o SZ.  D/w pt about mood changes that are possible.  Would be reasonable to try.    HLD.  Statin intolerant.  Labs d/w pt along with weight.  Obese.  Exercise limited by foot pain.   PMH and SH reviewed  Meds, vitals, and allergies reviewed.   ROS: See HPI.  Otherwise negative.    GEN: nad, alert and oriented HEENT: mucous membranes moist NECK: supple w/o LA CV: rrr. PULM: ctab, no inc wob ABD: soft, +bs EXT: no edema SKIN: no acute rash  Diabetic foot exam: Normal inspection No skin breakdown No calluses  Normal DP pulses Normal sensation to light touch and monofilament Nails normal Plantar fascia ttp B

## 2012-05-25 DIAGNOSIS — M722 Plantar fascial fibromatosis: Secondary | ICD-10-CM | POA: Insufficient documentation

## 2012-05-25 DIAGNOSIS — Z0000 Encounter for general adult medical examination without abnormal findings: Secondary | ICD-10-CM | POA: Insufficient documentation

## 2012-05-25 DIAGNOSIS — F17200 Nicotine dependence, unspecified, uncomplicated: Secondary | ICD-10-CM | POA: Insufficient documentation

## 2012-05-25 DIAGNOSIS — E785 Hyperlipidemia, unspecified: Secondary | ICD-10-CM | POA: Insufficient documentation

## 2012-05-25 NOTE — Assessment & Plan Note (Signed)
Reasonable to try wellbutrin with mood cautions and typical instructions given, he understood. Call back as needed.

## 2012-05-25 NOTE — Assessment & Plan Note (Signed)
Statin intolerant, needs to lose weight, discussed.

## 2012-05-25 NOTE — Assessment & Plan Note (Signed)
D/w pt about weight loss, anatomy, and stretching.  He understood.

## 2012-05-25 NOTE — Assessment & Plan Note (Signed)
Exam noted as above, some improvement from prev.  A1c overall improved, needs to lose weight.  He understands.  Recheck later as scheduled.

## 2012-06-09 NOTE — Progress Notes (Signed)
EMG/NCS revealed mild bilateral median neuropathies at wrist, consistent with carpal tunnel syndrome.  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  No evidence of a generalized peripheral neuropathy.

## 2012-06-12 ENCOUNTER — Encounter: Payer: Self-pay | Admitting: *Deleted

## 2012-06-12 ENCOUNTER — Encounter: Payer: BC Managed Care – PPO | Attending: Family Medicine | Admitting: *Deleted

## 2012-06-12 VITALS — Ht 72.0 in | Wt 313.8 lb

## 2012-06-12 DIAGNOSIS — E1140 Type 2 diabetes mellitus with diabetic neuropathy, unspecified: Secondary | ICD-10-CM

## 2012-06-12 DIAGNOSIS — E119 Type 2 diabetes mellitus without complications: Secondary | ICD-10-CM | POA: Insufficient documentation

## 2012-06-12 DIAGNOSIS — Z713 Dietary counseling and surveillance: Secondary | ICD-10-CM | POA: Insufficient documentation

## 2012-06-12 NOTE — Progress Notes (Signed)
  Medical Nutrition Therapy:  Appt start time: 1645 end time:  1745.  Assessment:  Primary concerns today: patient here for diabetes education. He states history of DM2 for about 3 years. He lives alone, travels extensively with his job and tests his BG on occasion. He also complains of burning and pain in his feet for the past couple of years.   MEDICATIONS: see list. Diabetes medication includes Metformin    DIETARY INTAKE:  Usual eating pattern includes 3 meals and no snacks per day.  Everyday foods include easily purchased foods.  Avoided foods include juice, regular soda.    24-hr recall:  B ( AM): biscuit with sausage or bacon and hash browns, OR eggs, meat, fresh fruit, milk Snk ( AM): none  L ( PM): restaurant or skips; sandwich or 6" sub with chips, diet soda Snk ( PM): none D ( PM): eats out usually, burgers, chinese, etc Snk ( PM): none Beverages: milk, diet soda, water, beer  Usual physical activity: not now, travels a lot but has gym membership  Estimated energy needs: 2000 calories 225 g carbohydrates 150 g protein 56 g fat  Progress Towards Goal(s):  In progress.   Nutritional Diagnosis:  NB-1.6 Limited adherence to nutrition-related recommendations As related to diabetes management.  As evidenced by A1c of 7.2%.    Intervention:  Nutrition counseling and diabetes education initiated. Discussed basic physiology of diabetes, SMBG and rationale of checking BG at alternate times of day, A1c, Carb Counting and reading food labels, and benefits of increased activity. Plan: Aim for 5 Carb Choices (75 grams) per meal +/- 1 either way Aim for 2-3 Carbs per snack if hungry Consider using Calorie [employer] APP for more nutrition information Consider options to increase activity level even when traveling Read food labels for total carbohydrate of foods Continue checking BG as directed by MD  Handouts given during visit include:  Living Well with Diabetes Carb Counting and  Food Label handouts Meal Plan Card  Monitoring/Evaluation:  Dietary intake, exercise, reading food labels, and body weight in 8 week(s).

## 2012-06-12 NOTE — Patient Instructions (Addendum)
Plan: Aim for 5 Carb Choices (75 grams) per meal +/- 1 either way Aim for 2-3 Carbs per snack if hungry Consider using Calorie [employer] APP for more nutrition information Consider options to increase activity level even when traveling Read food labels for total carbohydrate of foods Continue checking BG as directed by MD

## 2012-06-16 ENCOUNTER — Encounter: Payer: Self-pay | Admitting: Neurology

## 2012-08-11 ENCOUNTER — Telehealth: Payer: Self-pay

## 2012-08-11 MED ORDER — ATOVAQUONE-PROGUANIL HCL 250-100 MG PO TABS: 1.0000 | ORAL_TABLET | Freq: Every day | ORAL | Status: DC

## 2012-08-11 NOTE — Telephone Encounter (Signed)
Will need to take malarone for 1-2 days before the trip, during the trip and then for 7 more days.

## 2012-08-11 NOTE — Telephone Encounter (Signed)
LMOVM

## 2012-08-11 NOTE — Telephone Encounter (Signed)
rx sent for malarone.  See if he's had typhoid vaccine.  If not, I'll send it to pharmacy.

## 2012-08-11 NOTE — Telephone Encounter (Signed)
Pt has discussed rx for malarone to [facility] for upcoming [place] trip from 08/25/12 thru 08/30/12.Please advise.

## 2012-08-13 NOTE — Telephone Encounter (Signed)
Pt called for status of Malarone; was not at [facility]. I spoke with Brasha at [facility] and gave verbal order as instructed. Pt notified done and pt said he has had typhoid vaccination.

## 2012-08-18 ENCOUNTER — Ambulatory Visit: Payer: BC Managed Care – PPO | Admitting: *Deleted

## 2012-09-15 ENCOUNTER — Ambulatory Visit (INDEPENDENT_AMBULATORY_CARE_PROVIDER_SITE_OTHER): Payer: BC Managed Care – PPO | Admitting: Family Medicine

## 2012-09-15 ENCOUNTER — Encounter: Payer: Self-pay | Admitting: Family Medicine

## 2012-09-15 VITALS — BP 130/84 | HR 92 | Temp 98.2°F | Wt 318.8 lb

## 2012-09-15 DIAGNOSIS — R197 Diarrhea, unspecified: Secondary | ICD-10-CM

## 2012-09-15 DIAGNOSIS — Z113 Encounter for screening for infections with a predominantly sexual mode of transmission: Secondary | ICD-10-CM

## 2012-09-15 MED ORDER — CIPROFLOXACIN HCL 500 MG PO TABS: 500.0000 mg | ORAL_TABLET | Freq: Every day | ORAL | Status: DC

## 2012-09-15 NOTE — Patient Instructions (Addendum)
If the diarrhea returns, start the cipro.  Call back as needed.   Go to the lab on the way out.  We'll contact you with your lab report.

## 2012-09-15 NOTE — Assessment & Plan Note (Signed)
See notes on labs. 

## 2012-09-15 NOTE — Progress Notes (Signed)
Diarrhea started in [place] 08/30/12, last day in [place]. Then continued in [place].  Resolved after about 4 days, with imodium.  Returned 09/13/12, resolved as of yesterday with imodium. No blood in diarrhea.  No one else was sick to his knowledge.  He was careful about water intake, from safe sources. No abd pain now, but some abd pain before the diarrhea.  Gurgling sensation in stomach prev.  Was on malarone for malaria proph.  No vomiting.  No fevers.    Would like STD screening.  No symptoms.  No h/o STD.   Meds, vitals, and allergies reviewed.   ROS: See HPI.  Otherwise, noncontributory.  nad ncat Mmm rrr ctab Abd soft, not ttp, normal BS Ext w/o edema

## 2012-09-15 NOTE — Assessment & Plan Note (Signed)
Presumed traveller's, but improved now.  Would hold cipro and start if return of sx.  He agrees.

## 2012-09-16 LAB — RPR

## 2013-03-02 ENCOUNTER — Encounter: Payer: Self-pay | Admitting: Family Medicine

## 2013-03-02 ENCOUNTER — Ambulatory Visit (INDEPENDENT_AMBULATORY_CARE_PROVIDER_SITE_OTHER): Payer: BC Managed Care – PPO | Admitting: Family Medicine

## 2013-03-02 VITALS — BP 146/92 | HR 92 | Temp 97.7°F | Wt 320.2 lb

## 2013-03-02 DIAGNOSIS — F17200 Nicotine dependence, unspecified, uncomplicated: Secondary | ICD-10-CM

## 2013-03-02 DIAGNOSIS — E1149 Type 2 diabetes mellitus with other diabetic neurological complication: Secondary | ICD-10-CM

## 2013-03-02 DIAGNOSIS — H6983 Other specified disorders of Eustachian tube, bilateral: Secondary | ICD-10-CM

## 2013-03-02 DIAGNOSIS — E1142 Type 2 diabetes mellitus with diabetic polyneuropathy: Secondary | ICD-10-CM

## 2013-03-02 DIAGNOSIS — Z202 Contact with and (suspected) exposure to infections with a predominantly sexual mode of transmission: Secondary | ICD-10-CM

## 2013-03-02 DIAGNOSIS — H6990 Unspecified Eustachian tube disorder, unspecified ear: Secondary | ICD-10-CM

## 2013-03-02 DIAGNOSIS — E1140 Type 2 diabetes mellitus with diabetic neuropathy, unspecified: Secondary | ICD-10-CM

## 2013-03-02 DIAGNOSIS — M549 Dorsalgia, unspecified: Secondary | ICD-10-CM

## 2013-03-02 DIAGNOSIS — E119 Type 2 diabetes mellitus without complications: Secondary | ICD-10-CM

## 2013-03-02 DIAGNOSIS — Z113 Encounter for screening for infections with a predominantly sexual mode of transmission: Secondary | ICD-10-CM

## 2013-03-02 DIAGNOSIS — H6980 Other specified disorders of Eustachian tube, unspecified ear: Secondary | ICD-10-CM

## 2013-03-02 DIAGNOSIS — Z9189 Other specified personal risk factors, not elsewhere classified: Secondary | ICD-10-CM

## 2013-03-02 NOTE — Patient Instructions (Addendum)
Go to the lab on the way out.  We'll contact you with your lab report.  I would stretch your back daily.  Use nasal saline and gently try to pop your ears. Take care.  Keep working on losing weight.   Schedule a physical for late summer (labs ahead of time).

## 2013-03-02 NOTE — Assessment & Plan Note (Signed)
Should improve with repeated gentle valsalva.  F/u prn.

## 2013-03-02 NOTE — Assessment & Plan Note (Signed)
Likely benign MSK source, needs to lose weight.  D/w pt about stretching.

## 2013-03-02 NOTE — Assessment & Plan Note (Signed)
Encouraged to continue working on cessation.

## 2013-03-02 NOTE — Assessment & Plan Note (Signed)
See notes on labs when resulted.  Discussed need to lose weight.

## 2013-03-02 NOTE — Assessment & Plan Note (Signed)
See notes on labs when resulted.   

## 2013-03-02 NOTE — Progress Notes (Signed)
He's cutting back smoking and using the patch with some effect.    Ears have been buzzing and "I feel like I need to pop them."  H/o cerumen impaction prev.   Back pain. Mid to lower back.  Radiates to both sides.  Intermittent, worse some days than others.  Some days worse if prolonged standing.  No radicular leg pain.    DM2.  When he went off metformin, the leg pain improved.  No weakness.  He'll have vibrational sensation in the R lateral thigh.  Hasn't checked sugar consistently.  Has been >200 on a recent check.  Due for A1c.   He's been travelling [place].  He's in a new relationship and wanted STD testing. No sx per patient.   Meds, vitals, and allergies reviewed.   ROS: See HPI.  Otherwise, noncontributory.  GEN: nad, alert and oriented HEENT: mucous membranes moist, TM wnl but ETD noted on valsalva, OP wnl, nasal exam stuffy NECK: supple w/o LA CV: rrr.  PULM: ctab, no inc wob ABD: soft, +bs EXT: no edema SKIN: no acute rash Back not ttp, no pain with forward flexion or facet loading.  Distally with gross motor wnl in the BLE

## 2013-03-03 LAB — HEMOGLOBIN A1C: Hgb A1c MFr Bld: 9.8 % — ABNORMAL HIGH (ref 4.6–6.5)

## 2013-03-03 LAB — RPR

## 2013-03-20 ENCOUNTER — Telehealth: Payer: Self-pay | Admitting: Family Medicine

## 2013-03-20 ENCOUNTER — Ambulatory Visit (INDEPENDENT_AMBULATORY_CARE_PROVIDER_SITE_OTHER): Payer: BC Managed Care – PPO | Admitting: Family Medicine

## 2013-03-20 VITALS — BP 135/89 | HR 89 | Temp 98.0°F | Resp 16 | Ht 72.5 in | Wt 318.0 lb

## 2013-03-20 DIAGNOSIS — L03319 Cellulitis of trunk, unspecified: Secondary | ICD-10-CM

## 2013-03-20 DIAGNOSIS — L02211 Cutaneous abscess of abdominal wall: Secondary | ICD-10-CM

## 2013-03-20 DIAGNOSIS — L02219 Cutaneous abscess of trunk, unspecified: Secondary | ICD-10-CM

## 2013-03-20 DIAGNOSIS — E119 Type 2 diabetes mellitus without complications: Secondary | ICD-10-CM

## 2013-03-20 MED ORDER — DOXYCYCLINE HYCLATE 100 MG PO TABS: 100.0000 mg | ORAL_TABLET | Freq: Two times a day (BID) | ORAL | Status: DC

## 2013-03-20 NOTE — Telephone Encounter (Signed)
Patient notified as instructed by telephone. No available appts in office this afternoon and pt will go to [facility].

## 2013-03-20 NOTE — Telephone Encounter (Signed)
If we have no open slots today please refer him to urgent care.  Thanks.

## 2013-03-20 NOTE — Progress Notes (Signed)
Subjective:    Patient ID: Jerry Eaton, male    DOB: [birth year], 42 y.o.   MRN: [id number]  HPI Jerry Eaton is a 42 y.o. male  Cyst/bump on lower abdomen - intially noticed about a week ago - initially small bump - more swollen/sore since yesterday, no fever.  Started to drain in office.   Hx of DM2 - last Aic 9.8 on 03/02/13.  Had been off metformin prior to that visit. Restarted metformin past few weeks. AiC in 7 range on metformin prior then.   Results for orders placed in visit on 03/02/13  HIV ANTIBODY (ROUTINE TESTING)      Result Value Range   HIV NON REACTIVE  NON REACTIVE  RPR      Result Value Range   RPR NON REAC  NON REAC  GC/CHLAMYDIA PROBE AMP, URINE      Result Value Range   Chlamydia, Swab/Urine, PCR NEGATIVE  NEGATIVE   GC Probe Amp, Urine NEGATIVE  NEGATIVE  HEMOGLOBIN A1C      Result Value Range   Hemoglobin A1C 9.8 (*) 4.6 - 6.5 %   Tx: neosporin    Past Medical History  Diagnosis Date  . Diabetes mellitus   . GERD (gastroesophageal reflux disease)   . Hyperlipidemia   . History of chicken pox   . Family history of polyps in the colon   . Fatty liver   . Carpal tunnel syndrome   . Sleep apnea   . Obesity    Past Surgical History  Procedure Laterality Date  . Lumbar disc surgery      L4/L5  . Colonoscopy      2012   Allergies  Allergen Reactions  . Chantix (Varenicline Tartrate)     irritable  . Crestor (Rosuvastatin Calcium)     myalgia  . Januvia (Sitagliptin Phosphate)     Inc in LFTs. Occurred with janumet, but prev tolerated metformin alone  . Lipitor (Atorvastatin Calcium)     myalgia  . Lidocaine Rash    Questionable   Prior to Admission medications   Medication Sig Start Date End Date Taking? Authorizing Provider  metFORMIN (GLUCOPHAGE) 500 MG tablet Take 1 tablet (500 mg total) by mouth 2 (two) times daily with a meal. 03/28/12 03/28/13 Yes Joaquim Nam, MD  polycarbophil (FIBERCON) 625 MG tablet Take 625 mg by mouth  daily.   Yes Historical Provider, MD  ranitidine (ZANTAC) 150 MG tablet Take 150 mg by mouth as needed.     Yes Historical Provider, MD   History   Social History  . Marital Status: Single    Spouse Name: N/A    Number of Children: N/A  . Years of Education: N/A   Occupational History  . Not on file.   Social History Main Topics  . Smoking status: Current Every Day Smoker -- 1.00 packs/day for 20 years    Types: Cigarettes  . Smokeless tobacco: Never Used  . Alcohol Use: Yes     Comment: 20 drinks per week as of 2012, as of 4/13 drinks rarely, as of 8/13: 15 beers a week, (6/weekend night)  . Drug Use: No  . Sexually Active: Not on file   Other Topics Concern  . 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, travels for work    Review of Systems  Constitutional: Negative for fever and chills.  Skin: Positive for  rash.       Objective:   Physical Exam  Vitals reviewed. Constitutional: He is oriented to person, place, and time. He appears well-developed and well-nourished.  Overweight/obese.   HENT:  Head: Normocephalic and atraumatic.  Pulmonary/Chest: Effort normal.  Neurological: He is alert and oriented to person, place, and time.  Skin: Skin is warm and dry.         Assessment & Plan:  Jerry Eaton is a 42 y.o. male Abscess of abdominal wall - Plan: Wound culture, doxycycline (VIBRA-TABS) 100 MG tablet,with hx of Diabetes mellitus, type 2, prior decreased control, but now back on meds. Small abcess lower abd wall - exudate expressed with pressure, but small incision/drainage to allow continued drainage with warm compresses. doxy100mg  BID, rtc precautions.  Will not need tofollow up if improving with above.   Meds ordered this encounter  . doxycycline (VIBRA-TABS) 100 MG tablet    Sig: Take 1 tablet (100 mg total) by mouth 2 (two) times daily.    Dispense:  20 tablet    Refill:  0   Patient  Instructions  Start the doxycycline tonight. This was initially sent to [facility], but instead fill the paper prescription. Apply warm compresses to the area 4-5 times per day with gentle pressure to express any pus.  Can follow up if needed in next 3-4 days if not improving on own. Return to the clinic or go to the nearest emergency room if any of your symptoms worsen or new symptoms occur. Abscess An abscess is an infected area that contains a collection of pus and debris.It can occur in almost any part of the body. An abscess is also known as a furuncle or boil. CAUSES  An abscess occurs when tissue gets infected. This can occur from blockage of oil or sweat glands, infection of hair follicles, or a minor injury to the skin. As the body tries to fight the infection, pus collects in the area and creates pressure under the skin. This pressure causes pain. People with weakened immune systems have difficulty fighting infections and get certain abscesses more often.  SYMPTOMS Usually an abscess develops on the skin and becomes a painful mass that is red, warm, and tender. If the abscess forms under the skin, you may feel a moveable soft area under the skin. Some abscesses break open (rupture) on their own, but most will continue to get worse without care. The infection can spread deeper into the body and eventually into the bloodstream, causing you to feel ill.  DIAGNOSIS  Your caregiver will take your medical history and perform a physical exam. A sample of fluid may also be taken from the abscess to determine what is causing your infection. TREATMENT  Your caregiver may prescribe antibiotic medicines to fight the infection. However, taking antibiotics alone usually does not cure an abscess. Your caregiver may need to make a small cut (incision) in the abscess to drain the pus. In some cases, gauze is packed into the abscess to reduce pain and to continue draining the area. HOME CARE INSTRUCTIONS   Only  take over-the-counter or prescription medicines for pain, discomfort, or fever as directed by your caregiver.  If you were prescribed antibiotics, take them as directed. Finish them even if you start to feel better.  If gauze is used, follow your caregiver's directions for changing the gauze.  To avoid spreading the infection:  Keep your draining abscess covered with a bandage.  Wash your hands well.  Do  not share personal care items, towels, or whirlpools with others.  Avoid skin contact with others.  Keep your skin and clothes clean around the abscess.  Keep all follow-up appointments as directed by your caregiver. SEEK MEDICAL CARE IF:   You have increased pain, swelling, redness, fluid drainage, or bleeding.  You have muscle aches, chills, or a general ill feeling.  You have a fever. MAKE SURE YOU:   Understand these instructions.  Will watch your condition.  Will get help right away if you are not doing well or get worse. Document Released: 07/18/2005 Document Revised: 04/08/2012 Document Reviewed: 12/21/2011 [facility] Patient Information 2014 [place], [place].

## 2013-03-20 NOTE — Progress Notes (Signed)
Procedure Note: Verbal consent obtained.  Local anesthesia with 1 cc 1% lidocaine.  Betadine prep.  Superficial incision with 11 blade.  Slight purulence expressed.  No packing used.  Cleansed and dressed.  Discussed wound care.  No need for follow up unless worsening or not improving.

## 2013-03-20 NOTE — Patient Instructions (Addendum)
Start the doxycycline tonight. This was initially sent to [facility], but instead fill the paper prescription. Apply warm compresses to the area 4-5 times per day with gentle pressure to express any pus.  Can follow up if needed in next 3-4 days if not improving on own. Return to the clinic or go to the nearest emergency room if any of your symptoms worsen or new symptoms occur. Abscess An abscess is an infected area that contains a collection of pus and debris.It can occur in almost any part of the body. An abscess is also known as a furuncle or boil. CAUSES  An abscess occurs when tissue gets infected. This can occur from blockage of oil or sweat glands, infection of hair follicles, or a minor injury to the skin. As the body tries to fight the infection, pus collects in the area and creates pressure under the skin. This pressure causes pain. People with weakened immune systems have difficulty fighting infections and get certain abscesses more often.  SYMPTOMS Usually an abscess develops on the skin and becomes a painful mass that is red, warm, and tender. If the abscess forms under the skin, you may feel a moveable soft area under the skin. Some abscesses break open (rupture) on their own, but most will continue to get worse without care. The infection can spread deeper into the body and eventually into the bloodstream, causing you to feel ill.  DIAGNOSIS  Your caregiver will take your medical history and perform a physical exam. A sample of fluid may also be taken from the abscess to determine what is causing your infection. TREATMENT  Your caregiver may prescribe antibiotic medicines to fight the infection. However, taking antibiotics alone usually does not cure an abscess. Your caregiver may need to make a small cut (incision) in the abscess to drain the pus. In some cases, gauze is packed into the abscess to reduce pain and to continue draining the area. HOME CARE INSTRUCTIONS   Only take  over-the-counter or prescription medicines for pain, discomfort, or fever as directed by your caregiver.  If you were prescribed antibiotics, take them as directed. Finish them even if you start to feel better.  If gauze is used, follow your caregiver's directions for changing the gauze.  To avoid spreading the infection:  Keep your draining abscess covered with a bandage.  Wash your hands well.  Do not share personal care items, towels, or whirlpools with others.  Avoid skin contact with others.  Keep your skin and clothes clean around the abscess.  Keep all follow-up appointments as directed by your caregiver. SEEK MEDICAL CARE IF:   You have increased pain, swelling, redness, fluid drainage, or bleeding.  You have muscle aches, chills, or a general ill feeling.  You have a fever. MAKE SURE YOU:   Understand these instructions.  Will watch your condition.  Will get help right away if you are not doing well or get worse. Document Released: 07/18/2005 Document Revised: 04/08/2012 Document Reviewed: 12/21/2011 [facility] Patient Information 2014 [place], [place].

## 2013-03-20 NOTE — Telephone Encounter (Signed)
Patient Information:  Caller Name: Marik  Phone: [phone]  Patient: Jerry Eaton, Jerry Eaton  Gender: Male  DOB: 1971-02-23  Age: 42 Years  PCP: Crawford Givens Clelia Croft) [facility])  Office Follow Up:  Does the office need to follow up with this patient?: Yes  Instructions For The Office: Appt are full.   Symptoms  Reason For Call & Symptoms: Pt called for an appt today for a red painful cyst on his lower pelvic area above the genitals and has had other cysts. 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 Pt states it is red and painful/the size of a dime. There is redness the size of a quarter around it.  No fever.  Reviewed Health History In EMR: Yes  Reviewed Medications In EMR: Yes  Reviewed Allergies In EMR: Yes  Reviewed Surgeries / Procedures: Yes  Date of Onset of Symptoms: 03/14/2013  Guideline(s) Used:  Skin Injury  Disposition Per Guideline:   See Today in Office  Reason For Disposition Reached:   Severe pain (e.g., excruciating)  Advice Given:  N/A  Patient Will Follow Care Advice:  YES

## 2013-03-23 LAB — WOUND CULTURE
Gram Stain: NONE SEEN
Gram Stain: NONE SEEN

## 2013-04-27 ENCOUNTER — Other Ambulatory Visit: Payer: Self-pay | Admitting: Family Medicine

## 2013-05-15 ENCOUNTER — Ambulatory Visit (INDEPENDENT_AMBULATORY_CARE_PROVIDER_SITE_OTHER): Payer: BC Managed Care – PPO | Admitting: Family Medicine

## 2013-05-15 ENCOUNTER — Encounter: Payer: Self-pay | Admitting: Family Medicine

## 2013-05-15 VITALS — BP 136/86 | HR 82 | Temp 98.0°F | Wt 318.5 lb

## 2013-05-15 DIAGNOSIS — R369 Urethral discharge, unspecified: Secondary | ICD-10-CM

## 2013-05-15 DIAGNOSIS — E1149 Type 2 diabetes mellitus with other diabetic neurological complication: Secondary | ICD-10-CM

## 2013-05-15 DIAGNOSIS — R300 Dysuria: Secondary | ICD-10-CM

## 2013-05-15 DIAGNOSIS — E1142 Type 2 diabetes mellitus with diabetic polyneuropathy: Secondary | ICD-10-CM

## 2013-05-15 DIAGNOSIS — E119 Type 2 diabetes mellitus without complications: Secondary | ICD-10-CM

## 2013-05-15 DIAGNOSIS — E1140 Type 2 diabetes mellitus with diabetic neuropathy, unspecified: Secondary | ICD-10-CM

## 2013-05-15 LAB — POCT URINALYSIS DIPSTICK
Glucose, UA: 250
Nitrite, UA: NEGATIVE
Urobilinogen, UA: 0.2

## 2013-05-15 LAB — HEMOGLOBIN A1C: Hgb A1c MFr Bld: 10.1 % — ABNORMAL HIGH (ref 4.6–6.5)

## 2013-05-15 MED ORDER — CEFTRIAXONE SODIUM 1 G IJ SOLR
250.0000 mg | Freq: Once | INTRAMUSCULAR | Status: AC
  Administered 2013-05-15: 125 mg via INTRAMUSCULAR

## 2013-05-15 MED ORDER — AZITHROMYCIN 500 MG PO TABS: 1000.0000 mg | ORAL_TABLET | Freq: Once | ORAL | Status: DC

## 2013-05-15 NOTE — Progress Notes (Signed)
Sugar this AM was 230.  Still on metformin.  Has been diffusely high.  He has f/u with the eye MD next week.  He "isn't good enough" on the diet yet, discussed.   "UTI of some sort."  Sexually active, with protection.  Started a few days ago, discomfort.  Discharge a few days ago, whitish.  Last urinated >1 hour ago.  Minimal pain.  No testicle pain.  No rash.  No FCNAVD.    Meds, vitals, and allergies reviewed.   ROS: See HPI.  Otherwise, noncontributory.  nad Testes bilaterally descended without nodularity, tenderness or masses. No scrotal masses or lesions. No penis lesions, scant white urethral discharge noted.

## 2013-05-15 NOTE — Patient Instructions (Addendum)
Take both of the azithromycin pills today.  This should gradually improve.  We'll contact you with your lab report. Take care.

## 2013-05-16 LAB — GC/CHLAMYDIA PROBE AMP, URINE: Chlamydia, Swab/Urine, PCR: NEGATIVE

## 2013-05-17 ENCOUNTER — Other Ambulatory Visit: Payer: Self-pay | Admitting: Family Medicine

## 2013-05-17 DIAGNOSIS — E119 Type 2 diabetes mellitus without complications: Secondary | ICD-10-CM

## 2013-05-17 DIAGNOSIS — R369 Urethral discharge, unspecified: Secondary | ICD-10-CM | POA: Insufficient documentation

## 2013-05-17 LAB — URINE CULTURE
Colony Count: NO GROWTH
Organism ID, Bacteria: NO GROWTH

## 2013-05-17 NOTE — Assessment & Plan Note (Signed)
See notes on labs d/w pt about insulin start depending on A1c.

## 2013-05-17 NOTE — Assessment & Plan Note (Signed)
Labs wnl, given rocephin and azithro. F/u prn.  D/w pt about safety.

## 2013-05-17 NOTE — Telephone Encounter (Signed)
Call pt.  All labs are fine except for A1c.  Would start insulin.   rx listed, please send if he consents.  Would sent to DM2 teaching for insulin start.  Call back with update on sugars about 1-2 weeks after insulin start.  Recheck A1c in 3 months before an OV.  Thanks.

## 2013-05-18 ENCOUNTER — Other Ambulatory Visit: Payer: Self-pay | Admitting: *Deleted

## 2013-05-18 MED ORDER — INSULIN PEN NEEDLE 31G X 5 MM MISC: Status: DC

## 2013-05-18 MED ORDER — INSULIN GLARGINE 100 UNIT/ML SOLOSTAR PEN: PEN_INJECTOR | SUBCUTANEOUS | Status: DC

## 2013-05-18 NOTE — Telephone Encounter (Signed)
Patient advised.   Medication phoned to pharmacy. Phone number to [facility] given to patient to see if he can schedule DM/Insulin teaching.  Patient wishes to call back later for 3 month lab/OV appt.

## 2013-05-19 ENCOUNTER — Telehealth: Payer: Self-pay | Admitting: *Deleted

## 2013-05-19 ENCOUNTER — Encounter: Payer: Self-pay | Admitting: *Deleted

## 2013-05-19 NOTE — Telephone Encounter (Signed)
Letter mailed

## 2013-05-19 NOTE — Telephone Encounter (Signed)
Message copied by Annamarie Major on Tue May 19, 2013 10:34 AM ------      Message from: Joaquim Nam      Created: Mon May 18, 2013  9:51 PM      Regarding: RE: Insulin / DM teaching       Yeah- I meant that to be a send out for DM2 teaching, either through [facility] or DM2 ed out of clinic.  Please send him a letter encouraging him to follow through on all of this.  Thanks.              Clelia Croft            ----- Message -----         From: Annamarie Major, CMA         Sent: 05/18/2013   6:33 PM           To: Joaquim Nam, MD      Subject: Insulin / DM teaching                                    Carlena Sax tells me that with the [facility] and meetings / filling in for the [facility], she is not able to commit to DM / insulin teaching.  She suggested that we use [facility].  I gave this patient their phone number but he seemed pretty blown away by the whole thing and I'm not sure he's going to follow through.  He wouldn't let me make his 3 months lab/OV, saying that he'd call back later for it.       ------

## 2013-05-26 ENCOUNTER — Telehealth: Payer: Self-pay | Admitting: Family Medicine

## 2013-05-26 NOTE — Telephone Encounter (Signed)
Patient says he is in [place] now and will go to another state next week before returning back here.  He says it could be 3 weeks before he can come back in.  Please advise.

## 2013-05-26 NOTE — Telephone Encounter (Signed)
Pt on abx for UTI (had OV 05/15/13 ) but symptoms are not subsiding, states that he is still having discomfort and irritation as well as discharge that is "more clear, not as clear as it was".

## 2013-05-26 NOTE — Telephone Encounter (Signed)
We need to recheck him, esp if he is still having symptoms.  Thanks.

## 2013-05-26 NOTE — Telephone Encounter (Signed)
If he is still having symptoms, then I would get checked while traveling, ie at [facility].  He should be able to get his labs if needed via mychart.  Thanks.

## 2013-05-27 NOTE — Telephone Encounter (Signed)
Patient notified as instructed by telephone. 

## 2013-06-15 ENCOUNTER — Ambulatory Visit (INDEPENDENT_AMBULATORY_CARE_PROVIDER_SITE_OTHER): Payer: BC Managed Care – PPO | Admitting: Family Medicine

## 2013-06-15 ENCOUNTER — Encounter: Payer: Self-pay | Admitting: Family Medicine

## 2013-06-15 VITALS — BP 124/86 | HR 76 | Temp 97.6°F | Wt 316.5 lb

## 2013-06-15 DIAGNOSIS — R300 Dysuria: Secondary | ICD-10-CM

## 2013-06-15 DIAGNOSIS — E119 Type 2 diabetes mellitus without complications: Secondary | ICD-10-CM

## 2013-06-15 DIAGNOSIS — R369 Urethral discharge, unspecified: Secondary | ICD-10-CM

## 2013-06-15 LAB — POCT URINALYSIS DIPSTICK
Bilirubin, UA: NEGATIVE
Blood, UA: NEGATIVE
Ketones, UA: NEGATIVE
Spec Grav, UA: >=1.03
pH, UA: 6

## 2013-06-15 MED ORDER — INSULIN GLARGINE 100 UNIT/ML SOLOSTAR PEN: PEN_INJECTOR | SUBCUTANEOUS | Status: DC

## 2013-06-15 NOTE — Assessment & Plan Note (Signed)
Improved but with dysuria and unremarkable u/a. Dysuria changed around the time of inc caffeine intake.  Would taper caffeine and soda and then go from there.  Prev with neg testing. He agrees with plan.

## 2013-06-15 NOTE — Progress Notes (Signed)
Travelling a lot.  Now back in town.  The whitish penile discharge is resolved.  He still has penile pressure and pressure in lower abd.  No burning with urination but slight discomfort along the shaft of the penis.  No FCNAVD.  No testicle pain.  Head of penis feels raw, externally and internally but w/o visible change. No new partners. Prev STD testing wnl.  Prev with no trich on micro per MD exam of urine.  He has been drinking more coffee more than normal.  One soda a day.    DM2.  Sugar has slowly come down.  Now around 170 in AM.  Still going on up his insulin dose.   Meds, vitals, and allergies reviewed.   ROS: See HPI.  Otherwise, noncontributory.  Nad ncat No cva pain rrr ctab abd soft, suprapubic area minimally ttp Testes bilaterally descended without nodularity, tenderness or masses. No scrotal masses or lesions. No penis lesions or urethral discharge.

## 2013-06-15 NOTE — Patient Instructions (Addendum)
Recheck A1c in about 2 months before a visit.  Cut back on caffeine and coffee/soda.  Those may irritate your bladder.  Take care.

## 2013-07-28 ENCOUNTER — Other Ambulatory Visit: Payer: Self-pay | Admitting: Family Medicine

## 2013-08-18 ENCOUNTER — Encounter: Payer: Self-pay | Admitting: Family Medicine

## 2013-08-18 ENCOUNTER — Ambulatory Visit (INDEPENDENT_AMBULATORY_CARE_PROVIDER_SITE_OTHER): Payer: BC Managed Care – PPO | Admitting: Family Medicine

## 2013-08-18 VITALS — BP 132/90 | HR 92 | Temp 98.0°F | Wt 321.5 lb

## 2013-08-18 DIAGNOSIS — R300 Dysuria: Secondary | ICD-10-CM

## 2013-08-18 DIAGNOSIS — E1140 Type 2 diabetes mellitus with diabetic neuropathy, unspecified: Secondary | ICD-10-CM

## 2013-08-18 DIAGNOSIS — R109 Unspecified abdominal pain: Secondary | ICD-10-CM

## 2013-08-18 DIAGNOSIS — E1149 Type 2 diabetes mellitus with other diabetic neurological complication: Secondary | ICD-10-CM

## 2013-08-18 DIAGNOSIS — E1142 Type 2 diabetes mellitus with diabetic polyneuropathy: Secondary | ICD-10-CM

## 2013-08-18 LAB — POCT URINALYSIS DIPSTICK
Bilirubin, UA: NEGATIVE
Blood, UA: NEGATIVE
Glucose, UA: 1000
Ketones, UA: NEGATIVE
Nitrite, UA: NEGATIVE
Spec Grav, UA: 1.015
pH, UA: 6.5

## 2013-08-18 MED ORDER — INSULIN GLARGINE 100 UNIT/ML SOLOSTAR PEN: PEN_INJECTOR | SUBCUTANEOUS | Status: DC

## 2013-08-18 NOTE — Patient Instructions (Signed)
Go to the lab on the way out.  We'll contact you with your lab report. Go see Shirlee Limerick about your CT.  We'll go from there.  Take care.

## 2013-08-18 NOTE — Progress Notes (Addendum)
Sugar is improved recently, <150 on AM checks with 50 units of lantus.  Legs and feet feel better in the meantime.    No penile discharge now.  Prev white discharge resolved.  Still with sharp L lower abd pain, unclear if related to meals. Still with distal penile irritation. Bilateral  pressure sensation in the lower abd. Possible discoloration on the tip of the penis noted by patient. Not burning but some discomfort with urination. He has had some B testicular soreness.  Meds, vitals, and allergies reviewed.   ROS: See HPI.  Otherwise, noncontributory.  nad ncat Mmm rrr ctab abd soft, ttp diffusely in the lower abd w/o rebound, suprapubic area ttp. Testes bilaterally descended without nodularity or masses. No scrotal masses or lesions. No penis lesions or urethral discharge.

## 2013-08-19 ENCOUNTER — Ambulatory Visit (INDEPENDENT_AMBULATORY_CARE_PROVIDER_SITE_OTHER)
Admission: RE | Admit: 2013-08-19 | Discharge: 2013-08-19 | Disposition: A | Discharge status: Home / Self Care | Payer: BC Managed Care – PPO | Source: Ambulatory Visit | Attending: Family Medicine | Admitting: Family Medicine

## 2013-08-19 DIAGNOSIS — R109 Unspecified abdominal pain: Secondary | ICD-10-CM

## 2013-08-19 LAB — BASIC METABOLIC PANEL
BUN: 18 mg/dL (ref 6–23)
CO2: 25 mEq/L (ref 19–32)
Chloride: 100 mEq/L (ref 96–112)
Creatinine, Ser: 0.8 mg/dL (ref 0.4–1.5)
Glucose, Bld: 188 mg/dL — ABNORMAL HIGH (ref 70–99)
Potassium: 4.5 mEq/L (ref 3.5–5.1)

## 2013-08-19 MED ORDER — IOHEXOL 300 MG/ML  SOLN
100.0000 mL | Freq: Once | INTRAMUSCULAR | Status: AC | PRN
  Administered 2013-08-19: 100 mL via INTRAVENOUS

## 2013-08-19 NOTE — Assessment & Plan Note (Signed)
Improved some with home checks of sugar.  Continue as is with insulin. Recheck A1c later as scheduled.

## 2013-08-19 NOTE — Assessment & Plan Note (Signed)
Persistent, no clear cause, with no sig findings other than sugar on Ua.  Would check CT abd/pelvis at this point.  I don't know if patient is having lower abd pain from a GI source with referred pain. Dw pt.  Check CBC and BMET today.  He agrees with plan. >25 min spent with face to face with patient, >50% counseling and/or coordinating care.

## 2013-08-20 ENCOUNTER — Other Ambulatory Visit: Payer: Self-pay | Admitting: Family Medicine

## 2013-08-20 DIAGNOSIS — R109 Unspecified abdominal pain: Secondary | ICD-10-CM

## 2013-08-24 ENCOUNTER — Encounter: Payer: Self-pay | Admitting: Radiology

## 2013-08-25 ENCOUNTER — Encounter: Payer: Self-pay | Admitting: *Deleted

## 2013-08-31 ENCOUNTER — Ambulatory Visit: Payer: BC Managed Care – PPO | Admitting: *Deleted

## 2013-09-04 LAB — CBC WITH DIFFERENTIAL/PLATELET
Basophils Absolute: 0.1 10*3/uL (ref 0.0–0.1)
Basophils Relative: 0.7 % (ref 0.0–3.0)
Eosinophils Absolute: 0.2 10*3/uL (ref 0.0–0.7)
HCT: 44.2 % (ref 39.0–52.0)
Hemoglobin: 14.9 g/dL (ref 13.0–17.0)
Lymphocytes Relative: 24.2 % (ref 12.0–46.0)
Lymphs Abs: 2 10*3/uL (ref 0.7–4.0)
MCHC: 33.7 g/dL (ref 30.0–36.0)
MCV: 83.9 fl (ref 78.0–100.0)
Neutro Abs: 5.3 10*3/uL (ref 1.4–7.7)
Platelets: 332 10*3/uL (ref 150.0–400.0)
RBC: 5.27 Mil/uL (ref 4.22–5.81)

## 2013-10-19 ENCOUNTER — Encounter: Payer: BC Managed Care – PPO | Attending: Family Medicine | Admitting: Dietician

## 2013-10-19 ENCOUNTER — Encounter: Payer: Self-pay | Admitting: Dietician

## 2013-10-19 VITALS — Ht 72.0 in | Wt 323.7 lb

## 2013-10-19 DIAGNOSIS — E1149 Type 2 diabetes mellitus with other diabetic neurological complication: Secondary | ICD-10-CM | POA: Insufficient documentation

## 2013-10-19 DIAGNOSIS — E1140 Type 2 diabetes mellitus with diabetic neuropathy, unspecified: Secondary | ICD-10-CM

## 2013-10-19 DIAGNOSIS — Z713 Dietary counseling and surveillance: Secondary | ICD-10-CM | POA: Insufficient documentation

## 2013-10-19 DIAGNOSIS — E1142 Type 2 diabetes mellitus with diabetic polyneuropathy: Secondary | ICD-10-CM | POA: Insufficient documentation

## 2013-10-19 NOTE — Progress Notes (Signed)
Appt start time: 0945 end time:  1100.   Assessment:  Patient was seen on  10/19/13 for individual diabetes education. Fynn started on insulin (Lantus) in July after Hgb A1c went up. Previously was on Metformin. 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   Cut out sweetened beverages several years ago. States that he "eats too many carbs" though has tried to eat better in the past. States he is not hungry during the day but will eat a lot at night. Most meals are from restaurants since he travels a lot.   Has gone to the gym in the past, though not currently exercising regularly.   Current HbA1c:10.1 on 7/25  Preferred Learning Style:   No preference indicated   Learning Readiness:   Contemplating  MEDICATIONS:Lantus  DIETARY INTAKE:  Everyday foods include: biscuits, fast food. Avoided foods include most vegetables  24-hr recall:  B ( AM): sausage biscuit with unsweet tea or diet coke or coffee with sweetener   Snk ( AM): none  L ( PM): fast food on the road with diet soda, skips 2 x week  Snk ( PM): none D ( PM): [place], [place], burger  Snk ( PM): none Beverages: diet soda, unsweet tea, coffe  Usual physical activity: none  Estimated energy needs: 2000 calories 225 g carbohydrates 150 g protein 56 g fat  Progress Towards Goal(s):  In progress.   Nutritional Diagnosis:  [place]-2.1 Inpaired nutrition utilization As related to glucose metabolism.  As evidenced by Hgb A1c of 10.1%.    Intervention:  Nutrition counseling provided.  Discussed diabetes disease process and treatment options.  Discussed physiology of diabetes and role of obesity on insulin resistance.  Encouraged moderate weight reduction to improve glucose levels.  Discussed role of medications and diet in glucose control  Provided education on macronutrients on glucose levels.  Provided education on carb counting, importance of regularly scheduled meals/snacks, and meal  planning  Discussed effects of physical activity on glucose levels and long-term glucose control.  Recommended 150 minutes of physical activity/week.  Reviewed patient medications.  Discussed role of medication on blood glucose and possible side effects  Discussed blood glucose monitoring and interpretation.  Discussed recommended target ranges and individual ranges.    Described short-term complications: hyper- and hypo-glycemia.  Discussed causes,symptoms, and treatment options.  Discussed prevention, detection, and treatment of long-term complications.  Discussed the role of prolonged elevated glucose levels on body systems.  Discussed role of stress on blood glucose levels and discussed strategies to manage psychosocial issues.  Discussed recommendations for long-term diabetes self-care.  Established checklist for medical, dental, and emotional self-care.  Teaching Method Utilized:  Visual Auditory  Handouts given during visit include:  Living Well with Diabetes  Blood Glucose Monitoring  Carbohydrate Counting  Diabetes Self Care  Yellow Card  Barriers to learning/adherence to lifestyle change: travels a lot for work and lives by himself  Diabetes self-care support plan:   [facility] support group  Demonstrated degree of understanding via:  Teach Back   Monitoring/Evaluation:  Dietary intake, exercise, blood sugar testing, and body weight in 6 month(s).

## 2013-10-19 NOTE — Patient Instructions (Signed)
Goals:  Follow Diabetes Meal Plan as instructed  Eat 3 meals and 2 snacks, every 3-5 hrs  Limit carbohydrate intake to 45-60 grams carbohydrate/meal  Limit carbohydrate intake to 0-15 grams carbohydrate/snack  Consider using Calorie King to check carbs at restaurants  Add lean protein foods to meals/snacks  Monitor glucose levels as instructed by your doctor (1 x day at different times)  Aim for 30 mins of physical activity daily  Bring food record and glucose log to your next nutrition visit  Overall, choose 1 manageable goal to work on for 1 month before moving on to a new goal.   Goal #1 exercise 2-3 x week

## 2013-11-19 ENCOUNTER — Encounter: Payer: Self-pay | Admitting: Family Medicine

## 2013-11-19 ENCOUNTER — Ambulatory Visit (INDEPENDENT_AMBULATORY_CARE_PROVIDER_SITE_OTHER): Payer: BC Managed Care – PPO | Admitting: Family Medicine

## 2013-11-19 VITALS — BP 126/80 | HR 96 | Temp 97.7°F | Wt 322.0 lb

## 2013-11-19 DIAGNOSIS — H0289 Other specified disorders of eyelid: Secondary | ICD-10-CM | POA: Insufficient documentation

## 2013-11-19 DIAGNOSIS — E119 Type 2 diabetes mellitus without complications: Secondary | ICD-10-CM

## 2013-11-19 NOTE — Assessment & Plan Note (Signed)
No FB, no vision loss. Not likely to be bacterial and not covered by current abx.  Possible viral vs irritant vs allergic process. Would use claritin and f/u prn.  Should resolve.  He is some better today per his report.

## 2013-11-19 NOTE — Patient Instructions (Addendum)
Go to the lab on the way out.  We'll contact you with your A1c report.  I would try taking claritin 10mg  a day for now in addition to the antibiotics.

## 2013-11-19 NOTE — Progress Notes (Signed)
Pre-visit discussion using our clinic review tool. No additional management support is needed unless otherwise documented below in the visit note.  [place]  R eyelid sx initially, upper eyelid was puffy and swollen.  Then L upper eyelid got red and puffy.  Lower lids are fine. On augmentin and erythromycin in the meantime.  Had eye discharge and crusting.  No vision changes/loss. The white of his eye wasn't red until minimally so today.  No contacts.  No chemical exposures to the eyes.  No URI sx o/w.  Eyelids are itching some today.  No sig allergy hx, not like this.    Meds, vitals, and allergies reviewed.   ROS: See HPI.  Otherwise, noncontributory.  nad ncat Tm wnl Pinna and canals wnl Op wnl Nasal exam wnl Fundus wnl B EOMI wnl B conjunctiva mildly injected, limbus sparing. B upper eyelids puffy but no fluctuant mass, lower lids wnl, no spreading erythema. No FBx2 Neck supple, no LA

## 2013-11-20 ENCOUNTER — Telehealth: Payer: Self-pay | Admitting: Family Medicine

## 2013-11-20 ENCOUNTER — Telehealth: Payer: Self-pay

## 2013-11-20 LAB — HEMOGLOBIN A1C: Hgb A1c MFr Bld: 8.6 % — ABNORMAL HIGH (ref 4.6–6.5)

## 2013-11-20 NOTE — Telephone Encounter (Signed)
Relevant patient education assigned to patient using Emmi. ° °

## 2013-11-24 ENCOUNTER — Encounter: Payer: Self-pay | Admitting: *Deleted

## 2013-11-30 ENCOUNTER — Ambulatory Visit (INDEPENDENT_AMBULATORY_CARE_PROVIDER_SITE_OTHER): Payer: BC Managed Care – PPO | Admitting: Family Medicine

## 2013-11-30 DIAGNOSIS — Z113 Encounter for screening for infections with a predominantly sexual mode of transmission: Secondary | ICD-10-CM

## 2013-11-30 DIAGNOSIS — H0289 Other specified disorders of eyelid: Secondary | ICD-10-CM

## 2013-11-30 DIAGNOSIS — R109 Unspecified abdominal pain: Secondary | ICD-10-CM

## 2013-11-30 DIAGNOSIS — R21 Rash and other nonspecific skin eruption: Secondary | ICD-10-CM

## 2013-11-30 DIAGNOSIS — E119 Type 2 diabetes mellitus without complications: Secondary | ICD-10-CM

## 2013-11-30 DIAGNOSIS — F17200 Nicotine dependence, unspecified, uncomplicated: Secondary | ICD-10-CM

## 2013-11-30 DIAGNOSIS — H029 Unspecified disorder of eyelid: Secondary | ICD-10-CM

## 2013-11-30 MED ORDER — METRONIDAZOLE 1 % EX GEL: Freq: Every day | CUTANEOUS | Status: DC

## 2013-11-30 MED ORDER — BUPROPION HCL ER (SR) 150 MG PO TB12: ORAL_TABLET | ORAL | Status: DC

## 2013-11-30 NOTE — Patient Instructions (Signed)
Go to the lab on the way out.  We'll contact you with your lab report. Rosaria Ferries will call about your referral. Use the metrogel on your rash and try the wellbutrin for smoking.  Take care.

## 2013-11-30 NOTE — Progress Notes (Signed)
Pre-visit discussion using our clinic review tool. No additional management support is needed unless otherwise documented below in the visit note.  He had more lower back and and groin pain in the last few days. He had seen uro recently with neg eval except for sugar in urine. He doesn't have burning with urination. He has hard BMs.  He wanted to get tested again for STDs.  Discussed.    R eyelids are back to normal but the L upper eyelid is still swollen. He is done with the abx from prev.  He hasn't seen eye doc recently.  L upper eyelid is sore to touch but not sore o/w. Scant discharge on the eye recently, improved.  No vision loss.  He needs referral to eye clinic.   He had some rosacea changes on the face recently.  He has had similar, milder sx prev.  He has sensitive skin at baseline.   He wanted to try wellbutrin again for smoking. He hadn't filled the rx prev. Routine cautions given.   Meds, vitals, and allergies reviewed.   ROS: See HPI.  Otherwise, noncontributory.  nad ncat except for puffy L upper eyelid, esp laterally.  Tm wn Nasal and OP exam wnl Perrl, eomi Conjunctiva wnl, no FB Underside of L upper eyelid irritated Neck supple, no LA rrr ctab Abd soft Back w/o CVA pain B facial rash at the cheeks/nose c/w rosacea

## 2013-12-01 DIAGNOSIS — R21 Rash and other nonspecific skin eruption: Secondary | ICD-10-CM | POA: Insufficient documentation

## 2013-12-01 LAB — GC/CHLAMYDIA PROBE AMP, URINE
Chlamydia, Swab/Urine, PCR: NEGATIVE
GC Probe Amp, Urine: NEGATIVE

## 2013-12-01 LAB — RPR

## 2013-12-01 LAB — HIV ANTIBODY (ROUTINE TESTING W REFLEX): HIV: NONREACTIVE

## 2013-12-01 NOTE — Assessment & Plan Note (Signed)
Persisting, refer to eye clinic.  Unclear if related to other sx.  dw pt.

## 2013-12-01 NOTE — Assessment & Plan Note (Signed)
Start metrogel, presumed rosacea.

## 2013-12-01 NOTE — Assessment & Plan Note (Signed)
Still not a firm dx, recheck STD screen today.

## 2013-12-01 NOTE — Assessment & Plan Note (Signed)
Routine caution given on wellbutrin, will try to stop smoking.

## 2013-12-21 LAB — HM DIABETES EYE EXAM

## 2014-01-05 ENCOUNTER — Encounter: Payer: Self-pay | Admitting: Family Medicine

## 2014-03-01 ENCOUNTER — Encounter: Payer: Self-pay | Admitting: Internal Medicine

## 2014-03-01 ENCOUNTER — Ambulatory Visit (INDEPENDENT_AMBULATORY_CARE_PROVIDER_SITE_OTHER): Payer: BC Managed Care – PPO | Admitting: Internal Medicine

## 2014-03-01 VITALS — BP 120/80 | HR 77 | Temp 97.7°F | Wt 328.0 lb

## 2014-03-01 DIAGNOSIS — J3489 Other specified disorders of nose and nasal sinuses: Secondary | ICD-10-CM

## 2014-03-01 NOTE — Progress Notes (Signed)
Pre visit review using our clinic review tool, if applicable. No additional management support is needed unless otherwise documented below in the visit note. 

## 2014-03-01 NOTE — Patient Instructions (Signed)
Please take the claritin-D first thing in the morning. Okay to continue the advil for pressure Try adding cetirizine 10mg  or fexofenadine 180mg  at bedtime. Let Dr Damita Dunnings know if you are not improving within a few weeks

## 2014-03-01 NOTE — Progress Notes (Signed)
Subjective:    Patient ID: Jerry Eaton, male    DOB: May 31, 1971, 43 y.o.   MRN: [id number]  HPI Had sty back in February Right eye--then to left Even saw eye doctor---has to have I&D on left Mostly better but still some lower lid mildly abnormal area  Now in past couple of weeks--has had some shooting pains in his eyes Now has pressure across frontal areas and back of heads Head is tingly--and arms 2 days ago--pain in posterior neck Some cough--seems like more than a smoker's cough. Not bad though. Just started the wellbutrin yesterday Some tinnitus  No fever Slight SOB 2 nights ago No sore throat--but he feels something when swallowing Not a lot of post nasal drip  Has had spring allergies in past---but generally mild Did try generic claritin D 5-6 days ago----seems to be helping a little  Current Outpatient Prescriptions on File Prior to Visit  Medication Sig Dispense Refill  . buPROPion (WELLBUTRIN SR) 150 MG 12 hr tablet Start with 1 a day for 3 days, then take it twice a day.  60 tablet  3  . ibuprofen (ADVIL,MOTRIN) 200 MG tablet Take 200 mg by mouth every 6 (six) hours as needed.      . Insulin Glargine 100 UNIT/ML SOPN Inject 50 Units into the skin.       . Insulin Pen Needle 31G X 5 MM MISC Use daily with insulin pen, dx diabetes  100 each  3  . ranitidine (ZANTAC) 150 MG tablet Take 150 mg by mouth as needed.         No current facility-administered medications on file prior to visit.    Allergies  Allergen Reactions  . Chantix [Varenicline Tartrate]     irritable  . Crestor [Rosuvastatin Calcium]     myalgia  . Januvia [Sitagliptin Murillo in LFTs. Occurred with janumet, but prev tolerated metformin alone  . Lipitor [Atorvastatin Calcium]     myalgia  . Lidocaine Rash    Questionable    Past Medical History  Diagnosis Date  . Diabetes mellitus   . GERD (gastroesophageal reflux disease)   . Hyperlipidemia   . History of chicken pox     . Family history of polyps in the colon   . Fatty liver   . Carpal tunnel syndrome   . Sleep apnea   . Obesity     Past Surgical History  Procedure Laterality Date  . Lumbar disc surgery      L4/L5  . Colonoscopy      2012    Family History  Problem Relation Age of Onset  . Diabetes Mother   . Diabetes Father   . Prostate cancer Father   . Hyperlipidemia Brother   . Colon cancer Paternal Uncle     History   Social History  . Marital Status: Single    Spouse Name: N/A    Number of Children: N/A  . Years of Education: N/A   Occupational History  . Not on file.   Social History Main Topics  . Smoking status: Current Every Day Smoker -- 1.00 packs/day for 20 years    Types: Cigarettes  . Smokeless tobacco: Never Used  . Alcohol Use: Yes     Comment: 20 drinks per week as of 2012, as of 4/13 drinks rarely, as of 8/13: 15 beers a week, (6/weekend night)  . Drug Use: No  . Sexual Activity: Not on file  Other Topics Concern  . 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 No distress.  HENT:  Mouth/Throat: Oropharynx is clear and moist. No oropharyngeal exudate.  No sinus tenderness Moderate nasal congestion TMs normal  Eyes:  Tiny hordeolum on mid lower left lid No conjunctival injection   Neck: Normal range of motion. Neck supple. No thyromegaly present.  Pain is over upper thoracic area--not cervical  Pulmonary/Chest: Effort normal and breath sounds normal. No respiratory distress. He has no wheezes. He has no rales.  Lymphadenopathy:    He has no cervical adenopathy.          Assessment & Plan:

## 2014-03-01 NOTE — Assessment & Plan Note (Signed)
Seems most like allergy related Some stress which might account for the neck and upper back pain Can't explain the tingling  No worrisome history or exam Will have him add additional antihistamine at night Consider reeval if symptoms progress

## 2014-04-30 ENCOUNTER — Telehealth: Payer: Self-pay | Admitting: *Deleted

## 2014-04-30 NOTE — Telephone Encounter (Signed)
**  DIABETIC BUNDLE**  Left voicemail letting pt know he needs a fasting lab appt to have lipid profile and a1c checked

## 2014-05-06 ENCOUNTER — Encounter: Payer: Self-pay | Admitting: *Deleted

## 2014-06-16 ENCOUNTER — Other Ambulatory Visit: Payer: Self-pay | Admitting: Family Medicine

## 2014-06-16 DIAGNOSIS — E119 Type 2 diabetes mellitus without complications: Secondary | ICD-10-CM

## 2014-06-16 NOTE — Telephone Encounter (Signed)
Received refill request electronically from pharmacy. Medication request does not match the medication sheet. Last office visit 03/01/14/acute. Is it okay to refill medication as requested from the pharmacy?

## 2014-06-16 NOTE — Telephone Encounter (Addendum)
Spoke to patient and was advised that he is using 50 per day. Patient advised that lab order is in for his CPE.

## 2014-06-16 NOTE — Telephone Encounter (Signed)
Sent, but please verify the dose with patient.  If at 50 per day, then no need to send back to me. Also FYI, I put in his labs for his CPE.  Thanks.

## 2014-06-18 ENCOUNTER — Other Ambulatory Visit (INDEPENDENT_AMBULATORY_CARE_PROVIDER_SITE_OTHER): Payer: BC Managed Care – PPO

## 2014-06-18 DIAGNOSIS — E1349 Other specified diabetes mellitus with other diabetic neurological complication: Secondary | ICD-10-CM

## 2014-06-18 DIAGNOSIS — E0840 Diabetes mellitus due to underlying condition with diabetic neuropathy, unspecified: Secondary | ICD-10-CM

## 2014-06-18 DIAGNOSIS — E119 Type 2 diabetes mellitus without complications: Secondary | ICD-10-CM

## 2014-06-18 DIAGNOSIS — E1142 Type 2 diabetes mellitus with diabetic polyneuropathy: Secondary | ICD-10-CM

## 2014-06-18 DIAGNOSIS — Z0000 Encounter for general adult medical examination without abnormal findings: Secondary | ICD-10-CM

## 2014-06-18 DIAGNOSIS — E785 Hyperlipidemia, unspecified: Secondary | ICD-10-CM

## 2014-06-18 DIAGNOSIS — R7989 Other specified abnormal findings of blood chemistry: Secondary | ICD-10-CM

## 2014-06-18 DIAGNOSIS — R945 Abnormal results of liver function studies: Secondary | ICD-10-CM

## 2014-06-18 LAB — COMPREHENSIVE METABOLIC PANEL
ALBUMIN: 3.7 g/dL (ref 3.5–5.2)
ALT: 27 U/L (ref 0–53)
AST: 22 U/L (ref 0–37)
Alkaline Phosphatase: 62 U/L (ref 39–117)
BUN: 13 mg/dL (ref 6–23)
CALCIUM: 9.3 mg/dL (ref 8.4–10.5)
CHLORIDE: 101 meq/L (ref 96–112)
CO2: 27 meq/L (ref 19–32)
Creatinine, Ser: 0.7 mg/dL (ref 0.4–1.5)
GFR: 126.04 mL/min (ref >60.00)
Glucose, Bld: 179 mg/dL — ABNORMAL HIGH (ref 70–99)
Potassium: 4.4 mEq/L (ref 3.5–5.1)
Sodium: 137 mEq/L (ref 135–145)
Total Bilirubin: 0.7 mg/dL (ref 0.2–1.2)
Total Protein: 7.7 g/dL (ref 6.0–8.3)

## 2014-06-18 LAB — LIPID PANEL
Cholesterol: 226 mg/dL — ABNORMAL HIGH (ref 0–200)
HDL: 31.4 mg/dL — ABNORMAL LOW (ref >39.00)
LDL Cholesterol: 159 mg/dL — ABNORMAL HIGH (ref 0–99)
NONHDL: 194.6
TRIGLYCERIDES: 176 mg/dL — AB (ref 0.0–149.0)
Total CHOL/HDL Ratio: 7
VLDL: 35.2 mg/dL (ref 0.0–40.0)

## 2014-06-18 LAB — HEMOGLOBIN A1C: Hgb A1c MFr Bld: 9.3 % — ABNORMAL HIGH (ref 4.6–6.5)

## 2014-06-23 ENCOUNTER — Ambulatory Visit: Payer: BC Managed Care – PPO | Admitting: Family Medicine

## 2014-07-26 ENCOUNTER — Encounter: Payer: Self-pay | Admitting: Family Medicine

## 2014-07-26 ENCOUNTER — Ambulatory Visit (INDEPENDENT_AMBULATORY_CARE_PROVIDER_SITE_OTHER): Payer: BC Managed Care – PPO | Admitting: Family Medicine

## 2014-07-26 VITALS — BP 136/90 | HR 86 | Temp 98.6°F | Ht 72.0 in | Wt 321.0 lb

## 2014-07-26 DIAGNOSIS — Z0000 Encounter for general adult medical examination without abnormal findings: Secondary | ICD-10-CM

## 2014-07-26 DIAGNOSIS — Z23 Encounter for immunization: Secondary | ICD-10-CM

## 2014-07-26 DIAGNOSIS — E1140 Type 2 diabetes mellitus with diabetic neuropathy, unspecified: Secondary | ICD-10-CM

## 2014-07-26 DIAGNOSIS — Z7189 Other specified counseling: Secondary | ICD-10-CM

## 2014-07-26 DIAGNOSIS — M25519 Pain in unspecified shoulder: Secondary | ICD-10-CM

## 2014-07-26 LAB — MICROALBUMIN / CREATININE URINE RATIO
Creatinine,U: 276 mg/dL
MICROALB/CREAT RATIO: 0.7 mg/g (ref 0.0–30.0)
Microalb, Ur: 1.8 mg/dL (ref 0.0–1.9)

## 2014-07-26 MED ORDER — METFORMIN HCL 500 MG PO TABS: 500.0000 mg | ORAL_TABLET | Freq: Two times a day (BID) | ORAL | Status: DC

## 2014-07-26 NOTE — Assessment & Plan Note (Signed)
Looks to have B cuff sx, d/w pt about DM2 and sugar control, inc risk of cuff pathology with DM2, and ROM in the meantime.  Anatomy d/w pt.

## 2014-07-26 NOTE — Patient Instructions (Addendum)
Check your records and see if you had a pneumonia shot.  If not, we can do that later.  Start back on metformin, once a day for about 1-2 weeks, then twice a day if tolerated.   Go to the lab on the way out.  We'll contact you with your lab report. Recheck A1c in about 3 months before a visit. Take care.

## 2014-07-26 NOTE — Assessment & Plan Note (Signed)
Check MALB today and likely add on ACE if elevate.  dw pt.   Needs work on diet and exercise to help with weight.  Add on metformin for now and recheck A1c in about 3 months, gradually inc dose of metformin.  dw pt, he agrees.   Labs d/w pt.

## 2014-07-26 NOTE — Progress Notes (Signed)
Pre visit review using our clinic review tool, if applicable. No additional management support is needed unless otherwise documented below in the visit note.  CPE- See plan.  Routine anticipatory guidance given to patient.  See health maintenance. Tetanus 2013 Shingles not due.   Flu shot today.   He'll check on prev PNA vaccine.  PSA and colonoscopy not due yet.  Dw pt.   Living will d/w pt.  Would have his sister or mother equally designated.   Diet and exercise.  Both "not well, I don't think."  Some exercise at work.  Travel complicates his schedule.  Encouraged both D&E.  He'll stick with his diet for a while and then it "goes kaput."   B shoulder pain, with internal rotation, no trauma. No bruising.  Equal pain L and R shoulders.  Diabetes:  Using medications without difficulties:yes Hypoglycemic episodes:rare sx, if prolonged fasting Hyperglycemic episodes:yes,occ Feet problems: some tingling, at baseline.  Some cramping in legs noted.   Blood Sugars averaging: ~200 usually in AM eye exam within last year: yes, this year, ~12/2013  PMH and SH reviewed  Meds, vitals, and allergies reviewed.   ROS: See HPI.  Otherwise negative.    GEN: nad, alert and oriented HEENT: mucous membranes moist NECK: supple w/o LA CV: rrr. PULM: ctab, no inc wob ABD: soft, +bs EXT: no edema  Shoulders with normal ROM but B pain on int rotation, improved with scap assist.  No pain on ext rotation. No arm drop.  AC not ttp SKIN: no acute rash  Diabetic foot exam: Normal inspection No skin breakdown No calluses  Normal DP pulses Normal sensation to light touch and monofilament- more pain than loss of sensation on exam today.  Nails normal

## 2014-07-26 NOTE — Assessment & Plan Note (Signed)
Routine anticipatory guidance given to patient.  See health maintenance. Tetanus 2013 Shingles not due.   Flu shot today.   He'll check on prev PNA vaccine.  PSA and colonoscopy not due yet.  Dw pt.   Living will d/w pt.  Would have his sister or mother equally designated.   Diet and exercise.  Both "not well, I don't think."  Some exercise at work.  Travel complicates his schedule.  Encouraged both D&E.  He'll stick with his diet for a while and then it "goes kaput."

## 2014-07-26 NOTE — Addendum Note (Signed)
Addended by: Emelia Salisbury C on: 07/26/2014 02:14 PM   Modules accepted: Orders

## 2014-07-27 ENCOUNTER — Telehealth: Payer: Self-pay | Admitting: Family Medicine

## 2014-07-27 NOTE — Telephone Encounter (Signed)
emmi emailed °

## 2014-08-24 ENCOUNTER — Ambulatory Visit (INDEPENDENT_AMBULATORY_CARE_PROVIDER_SITE_OTHER): Payer: BC Managed Care – PPO | Admitting: Family Medicine

## 2014-08-24 ENCOUNTER — Encounter: Payer: Self-pay | Admitting: Family Medicine

## 2014-08-24 VITALS — BP 128/80 | HR 87 | Temp 98.0°F | Wt 325.0 lb

## 2014-08-24 DIAGNOSIS — L03311 Cellulitis of abdominal wall: Secondary | ICD-10-CM

## 2014-08-24 MED ORDER — DOXYCYCLINE HYCLATE 100 MG PO TABS: 100.0000 mg | ORAL_TABLET | Freq: Two times a day (BID) | ORAL | Status: DC

## 2014-08-24 NOTE — Patient Instructions (Signed)
Start the doxycycline and use warm compresses.  If it gets bigger or more sore, then let me now.   Take care.

## 2014-08-24 NOTE — Progress Notes (Signed)
Pre visit review using our clinic review tool, if applicable. No additional management support is needed unless otherwise documented below in the visit note.  2 cm area on the R lower abd wall, near the belt line.  Sore and reddish.  H/o I&D for other lesions prev.  No fevers.  Less sore today than prev.  No drainage.   Meds, vitals, and allergies reviewed.   ROS: See HPI.  Otherwise, noncontributory.  nad ncat R lower abd wall with ~2cm reddish area with skin thickening but not very ttp, no pustular area noted.

## 2014-08-25 DIAGNOSIS — L0390 Cellulitis, unspecified: Secondary | ICD-10-CM | POA: Insufficient documentation

## 2014-08-25 NOTE — Assessment & Plan Note (Signed)
Doesn't appear to need I&D today.  D/w pt.  Some better today.  Would start doxy and use warm compresses.   Based on the way the lesion feels, I am concerned that if we do an I&D, there won't be a central pocket of pus and the I&D wouldn't be useful.  Dw pt.

## 2014-10-14 ENCOUNTER — Other Ambulatory Visit: Payer: Self-pay | Admitting: Family Medicine

## 2014-11-01 ENCOUNTER — Encounter: Payer: Self-pay | Admitting: Family Medicine

## 2014-11-01 ENCOUNTER — Ambulatory Visit (INDEPENDENT_AMBULATORY_CARE_PROVIDER_SITE_OTHER): Payer: 59 | Admitting: Family Medicine

## 2014-11-01 VITALS — BP 130/80 | HR 87 | Temp 97.7°F | Wt 329.2 lb

## 2014-11-01 DIAGNOSIS — M542 Cervicalgia: Secondary | ICD-10-CM

## 2014-11-01 DIAGNOSIS — E1140 Type 2 diabetes mellitus with diabetic neuropathy, unspecified: Secondary | ICD-10-CM

## 2014-11-01 DIAGNOSIS — L03319 Cellulitis of trunk, unspecified: Secondary | ICD-10-CM

## 2014-11-01 DIAGNOSIS — Z113 Encounter for screening for infections with a predominantly sexual mode of transmission: Secondary | ICD-10-CM

## 2014-11-01 DIAGNOSIS — E119 Type 2 diabetes mellitus without complications: Secondary | ICD-10-CM

## 2014-11-01 LAB — HEMOGLOBIN A1C: Hgb A1c MFr Bld: 9.7 % — ABNORMAL HIGH (ref 4.6–6.5)

## 2014-11-01 MED ORDER — DOXYCYCLINE HYCLATE 100 MG PO TABS: 100.0000 mg | ORAL_TABLET | Freq: Two times a day (BID) | ORAL | Status: DC

## 2014-11-01 MED ORDER — CYCLOBENZAPRINE HCL 10 MG PO TABS: 5.0000 mg | ORAL_TABLET | Freq: Every evening | ORAL | Status: DC | PRN

## 2014-11-01 NOTE — Progress Notes (Signed)
Pre visit review using our clinic review tool, if applicable. No additional management support is needed unless otherwise documented below in the visit note.  Neck pain.  He had had it prev over the years.  Restarted ~ thanksgiving 2015.  Noted when looking down and to the sides.  Posterior pain.  No arm sx.  Some L trap pain.  No trauma.  He thought he had slept wrong prev but it didn't resolve.  Episodically worse.  He doesn't take his own pillow with travel.  Home pillow is still in good shape.  He hadn't noted a clear change with travel.  Massage helped a little at the time.  No FCNAVD, no rash on neck.   DM2.  Intermittently compliant with metformin.  Compliant with insulin.  No recent checks.  Due for f/u A1c.  R scrotal pain.  No testicle pain.  No dysuria.  Local redness on R side of scrotum per patient report. New problem per patient.   Meds, vitals, and allergies reviewed.   ROS: See HPI.  Otherwise, noncontributory.  nad ncat Tm wnl OP wnl Neck supple, no midline pain but L>R lower trap ttp in the neck, no deficit on ROM but muscle tightness noted.  S/S wnl BUE.  Normal grip and radial pulses B No rash on neck or arms rrr ctab Local redness on R side of scrotum w/o fluctuant mass. No testicle tenderness. Normal penile shaft.

## 2014-11-01 NOTE — Patient Instructions (Signed)
Go to the lab on the way out.  We'll contact you with your lab report. Start back on doxycycline.  Notify us if not improved.   Use flexeril at night and see if pillow changes help your neck. Keep stretching your neck.

## 2014-11-02 DIAGNOSIS — M542 Cervicalgia: Secondary | ICD-10-CM | POA: Insufficient documentation

## 2014-11-02 LAB — GC/CHLAMYDIA PROBE AMP, URINE
Chlamydia, Swab/Urine, PCR: NEGATIVE
GC Probe Amp, Urine: NEGATIVE

## 2014-11-02 LAB — HIV ANTIBODY (ROUTINE TESTING W REFLEX): HIV 1&2 Ab, 4th Generation: NONREACTIVE

## 2014-11-02 LAB — RPR

## 2014-11-02 NOTE — Assessment & Plan Note (Signed)
Could be postural, ie with tension causing sx.  D/w pt about posture, pillow, stretching, prn flexeril with sedation caution.  No emergent sx.

## 2014-11-02 NOTE — Assessment & Plan Note (Addendum)
Start doxy, not likely to be STD but check basic labs, d/w pt.  Very limited distribution, ienot a fournier's gangrene

## 2014-11-02 NOTE — Assessment & Plan Note (Signed)
Uncontrolled, needs to be compliant with metformin, work on weight and diet and exercise, see notes on labs.

## 2015-01-27 ENCOUNTER — Other Ambulatory Visit: Payer: Self-pay | Admitting: Family Medicine

## 2015-01-27 DIAGNOSIS — E1340 Other specified diabetes mellitus with diabetic neuropathy, unspecified: Secondary | ICD-10-CM

## 2015-01-31 ENCOUNTER — Other Ambulatory Visit: Payer: 59

## 2015-02-04 ENCOUNTER — Other Ambulatory Visit: Payer: Self-pay

## 2015-02-04 ENCOUNTER — Other Ambulatory Visit (INDEPENDENT_AMBULATORY_CARE_PROVIDER_SITE_OTHER): Payer: 59

## 2015-02-04 DIAGNOSIS — E1340 Other specified diabetes mellitus with diabetic neuropathy, unspecified: Secondary | ICD-10-CM | POA: Diagnosis not present

## 2015-02-04 LAB — HEMOGLOBIN A1C: HEMOGLOBIN A1C: 9.3 % — AB (ref 4.6–6.5)

## 2015-02-04 NOTE — Telephone Encounter (Signed)
Pt left note requesting refill one touch verio IQ test strips; is not on hx or current med list; spoke with pt and he has not checked BS since end of 2015. Pt request instructions on how often to ck sugar daily. Pt said next week would be OK to get refilled.[employer].

## 2015-02-05 MED ORDER — GLUCOSE BLOOD VI STRP: ORAL_STRIP | Status: DC

## 2015-02-05 NOTE — Telephone Encounter (Signed)
Sent rx.  Needs to check sugar before and 2 hours after meals.  Bring pre/post sugars to OV, needs to be scheduled.  A1c still up.  Thanks.

## 2015-02-07 NOTE — Telephone Encounter (Signed)
Patient notified as instructed by telephone and verbalized understanding. Follow-up appointment scheduled. 

## 2015-02-07 NOTE — Telephone Encounter (Signed)
Left message on VM with instructions, advised pt to call back to schedule appt and if he has any questions.

## 2015-02-25 ENCOUNTER — Ambulatory Visit (INDEPENDENT_AMBULATORY_CARE_PROVIDER_SITE_OTHER): Payer: 59 | Admitting: Family Medicine

## 2015-02-25 ENCOUNTER — Encounter: Payer: Self-pay | Admitting: Family Medicine

## 2015-02-25 VITALS — BP 136/82 | HR 86 | Temp 98.0°F | Wt 322.8 lb

## 2015-02-25 DIAGNOSIS — E1140 Type 2 diabetes mellitus with diabetic neuropathy, unspecified: Secondary | ICD-10-CM

## 2015-02-25 DIAGNOSIS — F411 Generalized anxiety disorder: Secondary | ICD-10-CM

## 2015-02-25 MED ORDER — GABAPENTIN 300 MG PO CAPS: ORAL_CAPSULE | ORAL | Status: DC

## 2015-02-25 MED ORDER — SERTRALINE HCL 25 MG PO TABS: 25.0000 mg | ORAL_TABLET | Freq: Every day | ORAL | Status: DC

## 2015-02-25 NOTE — Progress Notes (Signed)
Pre visit review using our clinic review tool, if applicable. No additional management support is needed unless otherwise documented below in the visit note.  Diabetes:  Using medications without difficulties: occ misses doses of meds.   Hypoglycemic episodes: not unless prolonged fasting Hyperglycemic episodes: he has had sugars up to 300, 2 hours after meals.  Feet problems: more leg and foot aches as his sugar has been up.   Blood Sugars averaging: in AMs ~190 eye exam within last year: due, d/w pt.   He has fidgety/anxious disposition.  He tends to stress eat and not sleep well when worried.  He was asking about options, ie treatment for anxiety.  He isn't depressed, he has no SI/HI.  He was on zoloft years ago, with some relief of anxiety/panic sx.    Meds, vitals, and allergies reviewed.   ROS: See HPI.  Otherwise negative.    GEN: nad, alert and oriented HEENT: mucous membranes moist NECK: supple w/o LA CV: rrr. PULM: ctab, no inc wob ABD: soft, +bs EXT: no edema SKIN: no acute rash  Diabetic foot exam: Normal inspection No skin breakdown No calluses  Normal DP pulses Normal sensation to light touch and monofilament Nails normal

## 2015-02-25 NOTE — Patient Instructions (Addendum)
Call about an eye exam.  Recheck labs before a visit in about 3 months.  Increase your insulin by 1 unit a day until your AM sugar is <130.  If 90-130, no change.  If <90, cut back by 1 unit.   When 90-130, then fill out a grid with pre/post meal sugars.   Update me as needed about your mood.

## 2015-02-27 DIAGNOSIS — F411 Generalized anxiety disorder: Secondary | ICD-10-CM | POA: Insufficient documentation

## 2015-02-27 NOTE — Assessment & Plan Note (Signed)
Normal foot exam today.  He does have foot sx that correspond to higher sugar levels.  Can restart gabapentin.  Needs better sugar control, d/w pt.  He'll call about an eye exam.  Recheck labs before a visit in about 3 months.  Increase insulin by 1 unit a day until AM sugar is <130. If 90-130, no change. If <90, cut back by 1 unit.  When 90-130, then he'll fill out a grid with pre/post meal sugars and update me to consider for mealtime insulin.  Shouldn't start in the meantime since Am sugars still up.

## 2015-02-27 NOTE — Assessment & Plan Note (Signed)
He'll start zoloft back with 25mg  a day with routine cautions.  He'll update me as needed about his mood.  >25 minutes spent in face to face time with patient, >50% spent in counselling or coordination of care.

## 2015-03-03 ENCOUNTER — Encounter: Payer: Self-pay | Admitting: Internal Medicine

## 2015-04-13 ENCOUNTER — Telehealth: Payer: Self-pay

## 2015-04-13 NOTE — Telephone Encounter (Signed)
I contacted the pt and advised his A1C blood test is due next month.

## 2015-06-24 ENCOUNTER — Other Ambulatory Visit: Payer: Self-pay | Admitting: Family Medicine

## 2015-08-29 LAB — HM DIABETES EYE EXAM

## 2015-09-19 ENCOUNTER — Ambulatory Visit (INDEPENDENT_AMBULATORY_CARE_PROVIDER_SITE_OTHER): Payer: 59 | Admitting: Family Medicine

## 2015-09-19 ENCOUNTER — Encounter: Payer: Self-pay | Admitting: Family Medicine

## 2015-09-19 VITALS — BP 132/82 | HR 68 | Temp 97.8°F | Wt 314.0 lb

## 2015-09-19 DIAGNOSIS — Z794 Long term (current) use of insulin: Secondary | ICD-10-CM | POA: Diagnosis not present

## 2015-09-19 DIAGNOSIS — E119 Type 2 diabetes mellitus without complications: Secondary | ICD-10-CM | POA: Diagnosis not present

## 2015-09-19 DIAGNOSIS — E1140 Type 2 diabetes mellitus with diabetic neuropathy, unspecified: Secondary | ICD-10-CM | POA: Diagnosis not present

## 2015-09-19 DIAGNOSIS — J069 Acute upper respiratory infection, unspecified: Secondary | ICD-10-CM | POA: Diagnosis not present

## 2015-09-19 LAB — LIPID PANEL
CHOLESTEROL: 188 mg/dL (ref 0–200)
HDL: 30.6 mg/dL — ABNORMAL LOW (ref >39.00)
LDL Cholesterol: 140 mg/dL — ABNORMAL HIGH (ref 0–99)
NonHDL: 157.81
TRIGLYCERIDES: 90 mg/dL (ref 0.0–149.0)
Total CHOL/HDL Ratio: 6
VLDL: 18 mg/dL (ref 0.0–40.0)

## 2015-09-19 LAB — COMPREHENSIVE METABOLIC PANEL
ALBUMIN: 3.8 g/dL (ref 3.5–5.2)
ALT: 17 U/L (ref 0–53)
AST: 20 U/L (ref 0–37)
Alkaline Phosphatase: 56 U/L (ref 39–117)
BILIRUBIN TOTAL: 0.5 mg/dL (ref 0.2–1.2)
BUN: 19 mg/dL (ref 6–23)
CALCIUM: 9 mg/dL (ref 8.4–10.5)
CO2: 25 mEq/L (ref 19–32)
CREATININE: 0.76 mg/dL (ref 0.40–1.50)
Chloride: 106 mEq/L (ref 96–112)
GFR: 119.58 mL/min (ref >60.00)
Glucose, Bld: 125 mg/dL — ABNORMAL HIGH (ref 70–99)
Potassium: 3.8 mEq/L (ref 3.5–5.1)
SODIUM: 139 meq/L (ref 135–145)
TOTAL PROTEIN: 7.1 g/dL (ref 6.0–8.3)

## 2015-09-19 LAB — HEMOGLOBIN A1C: HEMOGLOBIN A1C: 8.3 % — AB (ref 4.6–6.5)

## 2015-09-19 LAB — MICROALBUMIN / CREATININE URINE RATIO
CREATININE, U: 304.6 mg/dL
MICROALB UR: 1.9 mg/dL (ref 0.0–1.9)
Microalb Creat Ratio: 0.6 mg/g (ref 0.0–30.0)

## 2015-09-19 MED ORDER — AMOXICILLIN-POT CLAVULANATE 875-125 MG PO TABS: 1.0000 | ORAL_TABLET | Freq: Two times a day (BID) | ORAL | Status: DC

## 2015-09-19 MED ORDER — INSULIN GLARGINE 100 UNIT/ML SOLOSTAR PEN: 60.0000 [IU] | PEN_INJECTOR | Freq: Every day | SUBCUTANEOUS | Status: DC

## 2015-09-19 NOTE — Assessment & Plan Note (Signed)
Continue work on weight, d/w pt.  Diet and exercise d/w pt.  See notes on labs.  No med change at OV.

## 2015-09-19 NOTE — Patient Instructions (Addendum)
Go to the lab on the way out.  We'll contact you with your lab report. Hold the antibiotics for now, start if you worsen again.  Ask the eye clinic about the spots you saw.  Plan on recheck in spring 2017.  Take care.  Keep working on your weight.

## 2015-09-19 NOTE — Progress Notes (Signed)
Pre visit review using our clinic review tool, if applicable. No additional management support is needed unless otherwise documented below in the visit note.  Diabetes:  Weight down intentionally.   Using medications without difficulties:yes Hypoglycemic episodes:no Hyperglycemic episodes:no Feet problems: still with pain at baseline. Possibly less help from gabapentin recently.   Blood Sugars averaging: 100-140 eye exam within last year: done 2 weeks ago.  No retinopathy per pt report.  Due for labs.    He had a "spot" in his vision about 6 months ago in his B eyes.  It self resolved.  No full vision loss.  Recently with normal eye exam.  No clear cause for the episode.  I encouraged him to mention this to his eye MD.  He didn't have wedge of vision loss in either eye.    Cough and cold sx.  Going on for about 110 days.  Sneezing, had a temp ~99.9, no other fevers.  Chest cold, cough, intermittent coughing fits.  Not much sputum.  Smoking.  Precontemplative re: cessation.  Sinus congestion.  Facial pain, improved with pseudophed.    He is getting some better recently.  Taking mucinex in meantime.    Meds, vitals, and allergies reviewed.   ROS: See HPI.  Otherwise negative.    GEN: nad, alert and oriented HEENT: mucous membranes moist, tm w/o erythema, nasal exam w/o erythema, clear discharge noted,  OP with cobblestoning, max sinuses slightly ttp NECK: supple w/o LA CV: rrr.   PULM: ctab, no inc wob EXT: no edema SKIN: no acute rash  Diabetic foot exam: Normal inspection No skin breakdown No calluses  Normal DP pulses Normal sensation to light touch and monofilament (tingling noted occ in the toes but normal monofilament exam today) Nails normal

## 2015-09-19 NOTE — Addendum Note (Signed)
Addended by: Marchia Bond on: 09/19/2015 04:18 PM   Modules accepted: Miquel Dunn

## 2015-09-19 NOTE — Addendum Note (Signed)
Addended by: Marchia Bond on: 09/19/2015 09:51 AM   Modules accepted: Orders, SmartSet

## 2015-09-19 NOTE — Assessment & Plan Note (Signed)
Hold rx for augmentin, since some better recently.  See AVS.  Start is worsening again.  He agrees.  Nontoxic.

## 2015-09-22 ENCOUNTER — Encounter: Payer: Self-pay | Admitting: Family Medicine

## 2015-12-12 ENCOUNTER — Other Ambulatory Visit: Payer: Self-pay | Admitting: Family Medicine

## 2015-12-27 ENCOUNTER — Encounter: Payer: Self-pay | Admitting: Internal Medicine

## 2016-04-02 ENCOUNTER — Ambulatory Visit (INDEPENDENT_AMBULATORY_CARE_PROVIDER_SITE_OTHER): Payer: 59 | Admitting: Family Medicine

## 2016-04-02 ENCOUNTER — Encounter: Payer: Self-pay | Admitting: Family Medicine

## 2016-04-02 VITALS — BP 136/80 | HR 82 | Temp 97.9°F | Wt 316.8 lb

## 2016-04-02 DIAGNOSIS — Z794 Long term (current) use of insulin: Secondary | ICD-10-CM

## 2016-04-02 DIAGNOSIS — G4700 Insomnia, unspecified: Secondary | ICD-10-CM | POA: Insufficient documentation

## 2016-04-02 DIAGNOSIS — G4733 Obstructive sleep apnea (adult) (pediatric): Secondary | ICD-10-CM | POA: Insufficient documentation

## 2016-04-02 DIAGNOSIS — E119 Type 2 diabetes mellitus without complications: Secondary | ICD-10-CM

## 2016-04-02 DIAGNOSIS — E1140 Type 2 diabetes mellitus with diabetic neuropathy, unspecified: Secondary | ICD-10-CM | POA: Diagnosis not present

## 2016-04-02 LAB — HEMOGLOBIN A1C: Hgb A1c MFr Bld: 9.2 % — ABNORMAL HIGH (ref 4.6–6.5)

## 2016-04-02 MED ORDER — TRAZODONE HCL 50 MG PO TABS
25.0000 mg | ORAL_TABLET | Freq: Every evening | ORAL | Status: DC | PRN
Start: 2016-04-02 — End: 2016-05-28

## 2016-04-02 NOTE — Patient Instructions (Addendum)
Call about a colonoscopy.  Go to the lab on the way out.  We'll contact you with your lab report. Check to see if you can get another mask that fits better or a mouthpiece in the meantime.   Insomnia: Try melatonin 3-5mg  at night as needed.  Then try benadryl 25-50mg  at night as needed.  Then try trazodone 25-50mg  at night as needed.   Take care.  Glad to see you.  Schedule a physical with labs ahead of time when possible.

## 2016-04-02 NOTE — Assessment & Plan Note (Signed)
He'll check to see if you can get another mask that fits better or a mouthpiece in the meantime.

## 2016-04-02 NOTE — Progress Notes (Signed)
Pre visit review using our clinic review tool, if applicable. No additional management support is needed unless otherwise documented below in the visit note.  DM2.  Due for A1c.  Sugar has been ~200s.  Not checking often.  Weight is not down, d/w pt.  Diet adherence is inconsistent at best.  Travel makes that worse.  He is going to hopefully be travelling less in the near future.  No lows. Still with pain in the B feet.  Taking gabapentin prn with some relief, no ADE on med.   Sleep troubles.  "I've been a bad sleeper forever."  Worse in the last 6-12 months.  He gets to sleep, but then wakes after about 4-5 hours.  Not worried but doesn't sleep well.  Still tired the next day.  Some snoring, he hates using his CPAP.  D/w pt about mouth/mask fit.  He isn't waking from snoring.    Meds, vitals, and allergies reviewed.   ROS: Per HPI unless specifically indicated in ROS section   GEN: nad, alert and oriented HEENT: mucous membranes moist NECK: supple w/o LA CV: rrr.  no murmur PULM: ctab, no inc wob ABD: soft, +bs EXT: trace BLE edema

## 2016-04-02 NOTE — Assessment & Plan Note (Signed)
  Try melatonin 3-5mg  at night as needed.  Then try benadryl 25-50mg  at night as needed.  Then try trazodone 25-50mg  at night as needed.

## 2016-04-02 NOTE — Assessment & Plan Note (Signed)
Needs weight loss and diet, d/w pt.  See notes on labs.  >25 minutes spent in face to face time with patient, >50% spent in counselling or coordination of care.

## 2016-04-16 ENCOUNTER — Encounter: Payer: Self-pay | Admitting: Internal Medicine

## 2016-04-16 ENCOUNTER — Ambulatory Visit (INDEPENDENT_AMBULATORY_CARE_PROVIDER_SITE_OTHER): Payer: 59 | Admitting: Family Medicine

## 2016-04-16 VITALS — BP 126/92 | HR 85 | Temp 98.3°F | Ht 72.0 in | Wt 310.2 lb

## 2016-04-16 DIAGNOSIS — K5909 Other constipation: Secondary | ICD-10-CM

## 2016-04-16 DIAGNOSIS — R1084 Generalized abdominal pain: Secondary | ICD-10-CM | POA: Diagnosis not present

## 2016-04-16 DIAGNOSIS — N50819 Testicular pain, unspecified: Secondary | ICD-10-CM | POA: Diagnosis not present

## 2016-04-16 DIAGNOSIS — R0789 Other chest pain: Secondary | ICD-10-CM

## 2016-04-16 LAB — H. PYLORI ANTIBODY, IGG: H PYLORI IGG: NEGATIVE

## 2016-04-16 LAB — BASIC METABOLIC PANEL
BUN: 11 mg/dL (ref 6–23)
CO2: 27 meq/L (ref 19–32)
Calcium: 9.3 mg/dL (ref 8.4–10.5)
Chloride: 102 mEq/L (ref 96–112)
Creatinine, Ser: 0.78 mg/dL (ref 0.40–1.50)
GFR: 115.73 mL/min (ref >60.00)
GLUCOSE: 198 mg/dL — AB (ref 70–99)
POTASSIUM: 4.4 meq/L (ref 3.5–5.1)
SODIUM: 135 meq/L (ref 135–145)

## 2016-04-16 LAB — LIPASE: Lipase: 19 U/L (ref 11.0–59.0)

## 2016-04-16 LAB — HEPATIC FUNCTION PANEL
ALT: 28 U/L (ref 0–53)
AST: 21 U/L (ref 0–37)
Albumin: 4.2 g/dL (ref 3.5–5.2)
Alkaline Phosphatase: 58 U/L (ref 39–117)
BILIRUBIN DIRECT: 0.1 mg/dL (ref 0.0–0.3)
BILIRUBIN TOTAL: 0.6 mg/dL (ref 0.2–1.2)
Total Protein: 7.7 g/dL (ref 6.0–8.3)

## 2016-04-16 NOTE — Progress Notes (Signed)
Pre visit review using our clinic review tool, if applicable. No additional management support is needed unless otherwise documented below in the visit note. 

## 2016-04-16 NOTE — Patient Instructions (Addendum)
CONSTIPATION Difficult, uncomfortable, infrequent BM  1. Warm prune juice, hot water, tea, coffee, apple juice 2. Prevention: drink 8 glasses water daily, FIBER (raw fruit, veggies, bran cereal, whole grains), regular exercise 3. Bulk formers like Metamucil (psyllium), Citrucel (methylcellulose) usually help 4. Stool softeners (Docusate) occaisionally OK 5. Occaisional over the counter Miralax usually safe 6. Overuse of stimulant  laxatives (Ex-Lax, Mag Citrate, Dulcolax, etc.) can be habit forming  

## 2016-04-16 NOTE — Progress Notes (Signed)
Dr. Frederico Hamman T. Zaquan Duffner, MD, [facility] [place], [address] Phone: [phone] Fax: [phone]  04/16/2016  Patient: Jerry Eaton, MRN: [id number], DOB: [birth year], 45 y.o.  Primary Physician:  Elsie Stain, MD   Chief Complaint  Patient presents with  . Abdominal Pain  . Constipation  . Back Pain  . Testicle Pain  . Chest Tenderness   Subjective:   Jerry Eaton is a 44 y.o. very pleasant male patient who presents with the following:  Urgent evaluation: he has a variety and multitude of complaints.  Abdominal pain. This past weekend, very constipated.  Some pain in the front and iintermittent tenderness  And the epigastric region, right upper quadrant, and right lower quadrant without specific pain in one particular region.  Sharp shooting pain in the testicle. This is completely gone now, and he had this only for very brief time like a flash.  He has no known STD exposure, no promiscuity at all in greater than 1 year. STD testing was fully negative one year ago. Lifted some instruments last week.   Thursday - BM was more stringy and thin. No diarrhea and constricted off. Then stopped on Saturday. Minimal BM on Sat, last night had some mag citrate.  Pain going across sides.   Lab Results  Component Value Date   HGBA1C 9.2* 04/02/2016    Recall colon is due.  L testicle - pain over Saturday.   Past Medical History, Surgical History, Social History, Family History, Problem List, Medications, and Allergies have been reviewed and updated if relevant.  Patient Active Problem List   Diagnosis Date Noted  . Insomnia 04/02/2016  . OSA (obstructive sleep apnea) 04/02/2016  . Anxiety state 02/27/2015  . Neck pain 11/02/2014  . Advance care planning 07/26/2014  . Pain in joint, shoulder region 07/26/2014  . Back pain 03/02/2013  . Plantar fasciitis 05/25/2012  . Routine general medical examination at  a health care facility 05/25/2012  . HLD (hyperlipidemia) 05/25/2012  . Smoking 05/25/2012  . Diabetes mellitus with neuropathy ([place]) 10/10/2011  . LFT elevation 10/10/2011  . Foot pain 10/10/2011  . FH: colon cancer 10/10/2011  . FH: prostate cancer 10/10/2011    Past Medical History  Diagnosis Date  . Diabetes mellitus   . GERD (gastroesophageal reflux disease)   . Hyperlipidemia   . History of chicken pox   . Family history of polyps in the colon   . Fatty liver   . Carpal tunnel syndrome   . Sleep apnea   . Obesity     Past Surgical History  Procedure Laterality Date  . Lumbar disc surgery      L4/L5  . Colonoscopy      2012  . Pilonidal cyst excision      2001    Social History   Social History  . Marital Status: Single    Spouse Name: N/A  . Number of Children: N/A  . Years of Education: N/A   Occupational History  . Not on file.   Social History Main Topics  . Smoking status: Current Every Day Smoker -- 1.00 packs/day for 20 years    Types: Cigarettes  . Smokeless tobacco: Never Used  . Alcohol Use: Yes     Comment: 12-20/week, usually on the weekends  . Drug Use: No  . Sexual Activity: Not on file   Other Topics Concern  . 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 Diabetes Mother   . Diabetes Father   . Prostate cancer Father     dx'd in his early 89s  . Hyperlipidemia Brother   . Colon cancer Paternal Uncle     dx'd near age 2    Allergies  Allergen Reactions  . Chantix [Varenicline Tartrate]     irritable  . Crestor [Rosuvastatin Calcium]     myalgia  . Januvia [Sitagliptin Pinnacle in LFTs. Occurred with janumet, but prev tolerated metformin alone  . Lipitor [Atorvastatin Calcium]     myalgia  . Lidocaine Rash    Questionable    Medication list reviewed and updated in full in [place].   GEN: No acute illnesses, no fevers, chills. GI: No n/v/d, eating normally Pulm: No SOB Interactive and getting along well at home.  Otherwise, ROS is as per the HPI.  Objective:   BP 126/92 mmHg  Pulse 85  Temp(Src) 98.3 F (36.8 C) (Oral)  Ht 6' (1.829 m)  Wt 310 lb 4 oz (140.728 kg)  BMI 42.07 kg/m2  GEN: WDWN, NAD, Non-toxic, A & O x 3 HEENT: Atraumatic, Normocephalic. Neck supple. No masses, No LAD. Ears and Nose: No external deformity. CV: RRR, No M/G/R. No JVD. No thrill. No extra heart sounds. Chest wall is nontender on exam. PULM: CTA B, no wheezes, crackles, rhonchi. No retractions. No resp. distress. No accessory muscle use. ABD: S, tender in the right upper quadrant, epigastric region, and to a lesser extent in the right lower quadrant, ND, + BS, No rebound, No HSM  EXTR: No c/c/e NEURO Normal gait.  PSYCH: anxious appearing GU: mmale external  Exam is normal.  Normal penis.  Testicles are nontender.  The patient has no hernia.  Laboratory and Imaging Data: Results for orders placed or performed in visit on [id number]  Basic metabolic panel  Result Value Ref Range   Sodium 135 135 - 145 mEq/L   Potassium 4.4 3.5 - 5.1 mEq/L   Chloride 102 96 - 112 mEq/L   CO2 27 19 - 32 mEq/L   Glucose, Bld 198 (H) 70 - 99 mg/dL   BUN 11 6 - 23 mg/dL   Creatinine, Ser 0.78 0.40 - 1.50 mg/dL   Calcium 9.3 8.4 - 10.5 mg/dL   GFR 115.73 >60.00 mL/min  Hepatic function panel  Result Value Ref Range   Total Bilirubin 0.6 0.2 - 1.2 mg/dL   Bilirubin, Direct 0.1 0.0 - 0.3 mg/dL   Alkaline Phosphatase 58 39 - 117 U/L   AST 21 0 - 37 U/L   ALT 28 0 - 53 U/L   Total Protein 7.7 6.0 - 8.3 g/dL   Albumin 4.2 3.5 - 5.2 g/dL  Lipase  Result Value Ref Range   Lipase 19.0 11.0 - 59.0 U/L  H. pylori antibody, IgG  Result Value Ref Range   H Pylori IgG Negative Negative     Assessment and Plan:   Abdominal pain, generalized - Plan: Basic metabolic panel, Hepatic  function panel, Lipase, H. pylori antibody, IgG, CT ABDOMEN PELVIS W CONTRAST  Other constipation - Plan: Basic metabolic panel, Hepatic function panel, Lipase, H. pylori antibody, IgG  Testicular pain  Chest wall pain   A variety of complaints with no obvious source.  Labs  are very reassuring.   Blood sugar elevation in a diabetic is not surprising.  Nonspecific abdominal pain with nausea including multiple quadrants including the right upper quadrant, epigastric region and right lower quadrant.  Obtain a  CT of the abdomen and pelvis to evaluate for structural abnormalities including diverticulitis, bowel obstruction,  Volvulus, or other internal derangement.  At age 53, he is actually overdue for his colonoscopy, so I strongly recommended this.  He has his recall paper at home and will call within the next 2 or 3 days.  Chest pain, chest wall, resolved.  Testicular pain, quite brief, completely resolved at this point.  At least mildly anxious in appearance.  Follow-up: if indicated based on further testing.  Orders Placed This Encounter  Procedures  . CT ABDOMEN PELVIS W CONTRAST  . Basic metabolic panel  . Hepatic function panel  . Lipase  . H. pylori antibody, IgG    Signed,  Linkin Vizzini T. Emmanuelle Hibbitts, MD   Patient's Medications  New Prescriptions   No medications on file  Previous Medications   B-D UF III MINI PEN NEEDLES 31G X 5 MM MISC    USE DAILY WITH INSULIN PEN, DX DIABETES   GABAPENTIN (NEURONTIN) 300 MG CAPSULE    Take 1 up to 3 times a day for foot pain   GLUCOSE BLOOD TEST STRIP    Check sugar before and 2 hours after meals. Okay to check >3 times per day- insulin treated, uncontrolled DM2.   IBUPROFEN (ADVIL,MOTRIN) 200 MG TABLET    Take 200 mg by mouth every 6 (six) hours as needed.   INSULIN GLARGINE (LANTUS SOLOSTAR) 100 UNIT/ML SOLOSTAR PEN    Inject 60 Units into the skin daily. Dx: E11.40   MELATONIN 5 MG TABS    Take 1 tablet by mouth at bedtime and may  repeat dose one time if needed.   METFORMIN (GLUCOPHAGE) 500 MG TABLET    Take 1 tablet (500 mg total) by mouth 2 (two) times daily with a meal.   RANITIDINE (ZANTAC) 150 MG TABLET    Take 150 mg by mouth as needed.     TRAZODONE (DESYREL) 50 MG TABLET    Take 0.5-1 tablets (25-50 mg total) by mouth at bedtime as needed for sleep.  Modified Medications   No medications on file  Discontinued Medications   No medications on file

## 2016-04-17 ENCOUNTER — Ambulatory Visit (INDEPENDENT_AMBULATORY_CARE_PROVIDER_SITE_OTHER)
Admission: RE | Admit: 2016-04-17 | Discharge: 2016-04-17 | Disposition: A | Discharge status: Home / Self Care | Payer: 59 | Source: Ambulatory Visit | Attending: Family Medicine | Admitting: Family Medicine

## 2016-04-17 ENCOUNTER — Encounter: Payer: Self-pay | Admitting: Family Medicine

## 2016-04-17 DIAGNOSIS — R1084 Generalized abdominal pain: Secondary | ICD-10-CM

## 2016-04-17 MED ORDER — IOPAMIDOL (ISOVUE-300) INJECTION 61%
100.0000 mL | Freq: Once | INTRAVENOUS | Status: AC | PRN
  Administered 2016-04-17: 100 mL via INTRAVENOUS

## 2016-04-18 ENCOUNTER — Ambulatory Visit: Payer: 59 | Admitting: Family Medicine

## 2016-04-18 ENCOUNTER — Telehealth: Payer: Self-pay

## 2016-04-18 MED ORDER — LEVOFLOXACIN 500 MG PO TABS: 500.0000 mg | ORAL_TABLET | Freq: Every day | ORAL | Status: DC

## 2016-04-18 NOTE — Telephone Encounter (Signed)
Levaquin sent into [facility].  Eric notified.  Appointment cancelled with Dr. Damita Dunnings.

## 2016-04-18 NOTE — Telephone Encounter (Signed)
Pt was seen 04/16/16; today pt having pain in urethra; no fever, urgency or frequency. Pt is going out of country on 04/20/16 and wants to know if abx can be sent to [facility]. Pt has scheduled appt with Dr Damita Dunnings 04/18/16 at 3:15 but pt is 1 hour from [facility] and if can get abx sent to pharmacy pt request to cancel 04/18/16 appt. Pt request cb. Pt will have to leave by 2 or 2:15 to make appt if necessary.Please advise.

## 2016-04-18 NOTE — Telephone Encounter (Signed)
Levaquin 500 mg, 1 po daily x 7 days, #7, 0 ref  F/u with Dr. Damita Dunnings if not improved after antibiotics.

## 2016-04-19 ENCOUNTER — Other Ambulatory Visit: Payer: 59

## 2016-05-28 ENCOUNTER — Ambulatory Visit (AMBULATORY_SURGERY_CENTER): Payer: Self-pay | Admitting: *Deleted

## 2016-05-28 VITALS — Ht 72.0 in | Wt 315.6 lb

## 2016-05-28 DIAGNOSIS — Z86010 Personal history of colonic polyps: Secondary | ICD-10-CM

## 2016-05-28 MED ORDER — NA SULFATE-K SULFATE-MG SULF 17.5-3.13-1.6 GM/177ML PO SOLN: ORAL | 0 refills | Status: DC

## 2016-05-28 NOTE — Progress Notes (Signed)
No egg or soy allergy  No anesthesia or intubation problems per pt  No diet medications taken   

## 2016-05-29 ENCOUNTER — Encounter: Payer: Self-pay | Admitting: Internal Medicine

## 2016-06-01 ENCOUNTER — Ambulatory Visit (INDEPENDENT_AMBULATORY_CARE_PROVIDER_SITE_OTHER): Payer: 59 | Admitting: Family Medicine

## 2016-06-01 ENCOUNTER — Encounter: Payer: Self-pay | Admitting: Family Medicine

## 2016-06-01 VITALS — BP 130/90 | HR 94 | Wt 313.0 lb

## 2016-06-01 DIAGNOSIS — G4700 Insomnia, unspecified: Secondary | ICD-10-CM

## 2016-06-01 DIAGNOSIS — R300 Dysuria: Secondary | ICD-10-CM

## 2016-06-01 DIAGNOSIS — Z794 Long term (current) use of insulin: Secondary | ICD-10-CM

## 2016-06-01 DIAGNOSIS — R1032 Left lower quadrant pain: Secondary | ICD-10-CM | POA: Diagnosis not present

## 2016-06-01 DIAGNOSIS — E1140 Type 2 diabetes mellitus with diabetic neuropathy, unspecified: Secondary | ICD-10-CM | POA: Diagnosis not present

## 2016-06-01 LAB — POC URINALSYSI DIPSTICK (AUTOMATED)
Blood, UA: NEGATIVE
Ketones, UA: NEGATIVE
LEUKOCYTES UA: NEGATIVE
NITRITE UA: NEGATIVE
PH UA: 6
Spec Grav, UA: >=1.03
Urobilinogen, UA: NEGATIVE

## 2016-06-01 MED ORDER — METFORMIN HCL 500 MG PO TABS: 500.0000 mg | ORAL_TABLET | Freq: Two times a day (BID) | ORAL | 3 refills | Status: DC

## 2016-06-01 MED ORDER — POLYETHYLENE GLYCOL 3350 17 GM/SCOOP PO POWD: 17.0000 g | Freq: Every day | ORAL | Status: DC | PRN

## 2016-06-01 NOTE — Patient Instructions (Signed)
Urine testing today.  We'll be in touch.  You could have a small hernia on the left side.  Update me if you notice more symptoms in the meantime.  Miralax if needed for constipation.  Take care.  Glad to see you.

## 2016-06-01 NOTE — Progress Notes (Signed)
DM2.  Needed refill on metformin.  Sent at [facility].  D/w pt.  Sugar higher off metformin.  He'll update me after getting back on metformin, d/w pt.  Needs weight loss.    Insomnia. Melatonin helped some with sleep.   D/w pt at OV.   L groin pain.  Prev seen by Dr. Lorelei Pont.  Treated with levaquin in the meantime, after last OV.  Still with some discomfort along the urethra.  Unclear if sugar related.  He has been having to strain occ with constipation.  No FCNAVD.  No blood in urine, no blood in stool.  No testicle pain but some groin discomfort.  No masses noted.  No pain with urination.  No discharge.    Prev CT with chronic L4-5 disc extrusion with moderate to severe multifactorial spinal stenosis at this level.  He doesn't have pain on standing.   PMH and SH reviewed  ROS: Per HPI unless specifically indicated in ROS section   Meds, vitals, and allergies reviewed.   GEN: nad, alert and oriented HEENT: mucous membranes moist NECK: supple w/o LA CV: rrr.  no murmur PULM: ctab, no inc wob ABD: soft, +bs EXT: no edema SKIN: no acute rash Testes bilaterally descended.  No scrotal masses or lesions. No penis lesions or urethral discharge. Possible small LIH noted on exam, no RIH noted.  No visible hernia.

## 2016-06-03 ENCOUNTER — Encounter: Payer: Self-pay | Admitting: Family Medicine

## 2016-06-03 DIAGNOSIS — R1030 Lower abdominal pain, unspecified: Secondary | ICD-10-CM | POA: Insufficient documentation

## 2016-06-03 NOTE — Assessment & Plan Note (Signed)
Sugar in urine, likely contributing to his urethral symptoms. Minimal amount of protein in urine, could be a false positive, very likely not related to his sx. If sx continue after getting his sugar controlled, then I want him to notify me.   Patient notified.  D/w pt about meds at OV.

## 2016-06-03 NOTE — Assessment & Plan Note (Signed)
Melatonin helped some with sleep.   D/w pt at OV. Continue as is, prn.

## 2016-06-03 NOTE — Assessment & Plan Note (Signed)
Unlikely to be from his back with no pain on standing, ie not typical for spinal stenosis sx.  D/w pt.  Could have LIH, but at this point would just observe since if present it would be small.  We can refer if needed.  Could have small groin strain.  ddx d/w pt. He'll update me if more symptoms in the meantime.  Also with constipation noted, see AVS re: miralax.  Benign abd exam.  No cva pain.

## 2016-06-08 ENCOUNTER — Telehealth: Payer: Self-pay | Admitting: Family Medicine

## 2016-06-08 NOTE — Telephone Encounter (Signed)
Patient Name: Jerry Eaton  DOB: January 16, 1971    Initial Comment Caller states he is having testicular pressure and pain on and off since last week, flaring up again today   Nurse Assessment  Nurse: Mallie Mussel, RN, Alveta Heimlich Date/Time Eilene Ghazi Time): 06/08/2016 1:40:11 PM  Confirm and document reason for call. If symptomatic, describe symptoms. You must click the next button to save text entered. ---Caller states that he has had testicular pressure/pain which began last week. He is having a flare up today. His pain is mostly in his left testicle. He rates his pain as 2-3 on 0-10 scale. This morning, it was more like a 5. His pain has been constant, but it varies in its intensity.  Has the patient traveled out of the country within the last 30 days? ---No  Does the patient have any new or worsening symptoms? ---Yes  Will a triage be completed? ---Yes  Related visit to physician within the last 2 weeks? ---No  Does the PT have any chronic conditions? (i.e. diabetes, asthma, etc.) ---Yes  List chronic conditions. ---Diabetes, Hypercholesterolemia  Is this a behavioral health or substance abuse call? ---No     Guidelines    Guideline Title Affirmed Question Affirmed Notes  Scrotal Pain [1] Constant pain in scrotum or testicle AND [2] present > 1 hour    Final Disposition User   Go to ED Now Mallie Mussel, RN, Alveta Heimlich    Comments  Caller declines to go to ER as recommended by triage. He does admit that the pain has been constant since it began, but sometimes its worse than others. He states that he only wants to see if the doctor thinks it would be okay for him to have a colonoscopy on Monday. Advised him that I will forward this to the office and someone will be calling him back.   Referrals  GO TO FACILITY REFUSED   Disagree/Comply: Disagree  Disagree/Comply Reason: Disagree with instructions

## 2016-06-08 NOTE — Telephone Encounter (Signed)
I think it may still be potentially okay to have the colonoscopy, but it isn't up to me- it would be up to the doc doing the colonoscopy.  He should notify them when he goes in to get their determination.   If the pain continues, then we'll need to address this at [facility] or otherwise.   Thanks.

## 2016-06-08 NOTE — Telephone Encounter (Signed)
Patient advised.

## 2016-06-11 ENCOUNTER — Encounter: Payer: Self-pay | Admitting: Internal Medicine

## 2016-06-11 ENCOUNTER — Ambulatory Visit (AMBULATORY_SURGERY_CENTER): Payer: 59 | Admitting: Internal Medicine

## 2016-06-11 VITALS — BP 117/69 | HR 59 | Temp 97.3°F | Resp 12 | Ht 72.0 in | Wt 315.0 lb

## 2016-06-11 DIAGNOSIS — D123 Benign neoplasm of transverse colon: Secondary | ICD-10-CM

## 2016-06-11 DIAGNOSIS — D125 Benign neoplasm of sigmoid colon: Secondary | ICD-10-CM

## 2016-06-11 DIAGNOSIS — D128 Benign neoplasm of rectum: Secondary | ICD-10-CM

## 2016-06-11 DIAGNOSIS — K621 Rectal polyp: Secondary | ICD-10-CM

## 2016-06-11 DIAGNOSIS — K635 Polyp of colon: Secondary | ICD-10-CM | POA: Diagnosis not present

## 2016-06-11 DIAGNOSIS — Z86010 Personal history of colonic polyps: Secondary | ICD-10-CM | POA: Diagnosis not present

## 2016-06-11 DIAGNOSIS — D122 Benign neoplasm of ascending colon: Secondary | ICD-10-CM

## 2016-06-11 DIAGNOSIS — D129 Benign neoplasm of anus and anal canal: Secondary | ICD-10-CM

## 2016-06-11 LAB — GLUCOSE, CAPILLARY
GLUCOSE-CAPILLARY: 125 mg/dL — AB (ref 65–99)
GLUCOSE-CAPILLARY: 109 mg/dL — AB (ref 65–99)

## 2016-06-11 MED ORDER — SODIUM CHLORIDE 0.9 % IV SOLN: 500.0000 mL | INTRAVENOUS | Status: DC

## 2016-06-11 NOTE — Progress Notes (Signed)
A/ox3 pleased with MAC, report to Sheila RN 

## 2016-06-11 NOTE — Progress Notes (Signed)
Called to room to assist during endoscopic procedure.  Patient ID and intended procedure confirmed with present staff. Received instructions for my participation in the procedure from the performing physician.  

## 2016-06-11 NOTE — Op Note (Signed)
Jerry Eaton Patient Name: Jerry Eaton Procedure Date: 06/11/2016 7:57 AM MRN: [id number] Endoscopist: Docia Chuck. Jerry Eaton , MD Age: 45 Referring MD:  Date of Birth: 1971/04/16 Gender: Male Account #: [id number] Procedure:                Colonoscopy with cold snare polypectomy x 5 Indications:              Surveillance: Personal history of adenomatous                            polyps on last colonoscopy 5 years ago, High risk                            colon cancer surveillance: Personal history of                            multiple (3 or more) adenomas. Prior exams 2006 and                            2012 with small TA's Medicines:                Monitored Anesthesia Care Procedure:                Pre-Anesthesia Assessment:                           - Prior to the procedure, a History and Physical                            was performed, and patient medications and                            allergies were reviewed. The patient's tolerance of                            previous anesthesia was also reviewed. The risks                            and benefits of the procedure and the sedation                            options and risks were discussed with the patient.                            All questions were answered, and informed consent                            was obtained. Prior Anticoagulants: The patient has                            taken no previous anticoagulant or antiplatelet                            agents. ASA Grade Assessment: II - A patient with  mild systemic disease. After reviewing the risks                            and benefits, the patient was deemed in                            satisfactory condition to undergo the procedure.                           After obtaining informed consent, the colonoscope                            was passed under direct vision. Throughout the                            procedure, the  patient's blood pressure, pulse, and                            oxygen saturations were monitored continuously. The                            Model CF-HQ190L [id number]) scope was introduced                            through the anus and advanced to the the cecum,                            identified by appendiceal orifice and ileocecal                            valve. The ileocecal valve, appendiceal orifice,                            and rectum were photographed. The quality of the                            bowel preparation was excellent. The colonoscopy                            was performed without difficulty. The patient                            tolerated the procedure well. The bowel preparation                            used was SUPREP. Scope In: 8:10:58 AM Scope Out: 8:28:05 AM Scope Withdrawal Time: 0 hours 14 minutes 34 seconds  Total Procedure Duration: 0 hours 17 minutes 7 seconds  Findings:                 Four polyps were found in the rectum, sigmoid colon                            (2), transverse colon and ascending colon. The  polyps were 3 to 4 mm in size. These polyps were                            removed with a cold snare. Resection and retrieval                            were complete.                           Internal hemorrhoids were found during retroflexion.                           The exam was otherwise without abnormality on                            direct and retroflexion views. Complications:            No immediate complications. Estimated blood loss:                            None. Estimated Blood Loss:     Estimated blood loss: none. Impression:               - Four 3 to 4 mm polyps in the rectum, in the                            sigmoid colon, in the transverse colon and in the                            ascending colon, removed with a cold snare.                            Resected and retrieved.                            - Internal hemorrhoids.                           - The examination was otherwise normal on direct                            and retroflexion views. Recommendation:           - Repeat colonoscopy in 3 years for surveillance.                           - Patient has a contact number available for                            emergencies. The signs and symptoms of potential                            delayed complications were discussed with the                            patient. Return to normal  activities tomorrow.                            Written discharge instructions were provided to the                            patient.                           - Resume previous diet.                           - Continue present medications.                           - Await pathology results. Docia Chuck. Jerry Pastor, MD 06/11/2016 8:34:35 AM This report has been signed electronically.

## 2016-06-11 NOTE — Patient Instructions (Signed)
YOU HAD AN ENDOSCOPIC PROCEDURE TODAY AT THE [facility]:   Refer to the procedure report that was given to you for any specific questions about what was found during the examination.  If the procedure report does not answer your questions, please call your gastroenterologist to clarify.  If you requested that your care partner not be given the details of your procedure findings, then the procedure report has been included in a sealed envelope for you to review at your convenience later.  YOU SHOULD EXPECT: Some feelings of bloating in the abdomen. Passage of more gas than usual.  Walking can help get rid of the air that was put into your GI tract during the procedure and reduce the bloating. If you had a lower endoscopy (such as a colonoscopy or flexible sigmoidoscopy) you may notice spotting of blood in your stool or on the toilet paper. If you underwent a bowel prep for your procedure, you may not have a normal bowel movement for a few days.  Please Note:  You might notice some irritation and congestion in your nose or some drainage.  This is from the oxygen used during your procedure.  There is no need for concern and it should clear up in a day or so.  SYMPTOMS TO REPORT IMMEDIATELY:   Following lower endoscopy (colonoscopy or flexible sigmoidoscopy):  Excessive amounts of blood in the stool  Significant tenderness or worsening of abdominal pains  Swelling of the abdomen that is new, acute  Fever of 100F or higher   For urgent or emergent issues, a gastroenterologist can be reached at any hour by calling [phone].   DIET:  We do recommend a small meal at first, but then you may proceed to your regular diet.  Drink plenty of fluids but you should avoid alcoholic beverages for 24 hours.  ACTIVITY:  You should plan to take it easy for the rest of today and you should NOT DRIVE or use heavy machinery until tomorrow (because of the sedation medicines used during the test).     FOLLOW UP: Our staff will call the number listed on your records the next business day following your procedure to check on you and address any questions or concerns that you may have regarding the information given to you following your procedure. If we do not reach you, we will leave a message.  However, if you are feeling well and you are not experiencing any problems, there is no need to return our call.  We will assume that you have returned to your regular daily activities without incident.  If any biopsies were taken you will be contacted by phone or by letter within the next 1-3 weeks.  Please call us at [phone] if you have not heard about the biopsies in 3 weeks.    SIGNATURES/CONFIDENTIALITY: You and/or your care partner have signed paperwork which will be entered into your electronic medical record.  These signatures attest to the fact that that the information above on your After Visit Summary has been reviewed and is understood.  Full responsibility of the confidentiality of this discharge information lies with you and/or your care-partner.   Resume medications. Information given on polyps,hemorrhoids and high fiber diet. 

## 2016-06-12 ENCOUNTER — Telehealth: Payer: Self-pay | Admitting: *Deleted

## 2016-06-12 NOTE — Telephone Encounter (Signed)
  Follow up Call-  Call back number 06/11/2016  Post procedure Call Back phone  # [phone]  Permission to leave phone message Yes  Some recent data might be hidden     Patient questions:  Do you have a fever, pain , or abdominal swelling? No. Pain Score  0 *  Have you tolerated food without any problems?yes  Have you been able to return to your normal activities? Yes.    Do you have any questions about your discharge instructions: Diet   No. Medications  No. Follow up visit  No.  Do you have questions or concerns about your Care? No.  Actions: * If pain score is 4 or above: No action needed, pain <4.

## 2016-06-13 ENCOUNTER — Encounter: Payer: Self-pay | Admitting: Family Medicine

## 2016-06-13 ENCOUNTER — Ambulatory Visit (INDEPENDENT_AMBULATORY_CARE_PROVIDER_SITE_OTHER): Payer: 59 | Admitting: Family Medicine

## 2016-06-13 VITALS — BP 120/82 | HR 92 | Temp 98.3°F | Wt 310.0 lb

## 2016-06-13 DIAGNOSIS — R1032 Left lower quadrant pain: Secondary | ICD-10-CM | POA: Diagnosis not present

## 2016-06-13 DIAGNOSIS — Z113 Encounter for screening for infections with a predominantly sexual mode of transmission: Secondary | ICD-10-CM

## 2016-06-13 LAB — HIV ANTIBODY (ROUTINE TESTING W REFLEX): HIV: NONREACTIVE

## 2016-06-13 NOTE — Patient Instructions (Signed)
Go to the lab on the way out.  We'll contact you with your lab report. We'll go from there.  We may need to get you to urology if the symptoms aren't explained by your sugar or labs.  Take care.  Glad to see you.

## 2016-06-13 NOTE — Progress Notes (Signed)
Less pain today.  See prev phone notes.  "It almost seems like I was passing something." no known passed stone.  He has tingling in the scrotum.  This past Friday AM he had L testicle pain.   D/w pt about STD screening.  Prev neg, in 2016.  No discharge.  No rash, no ulcer.  No fevers. Still with some intermittent burning in the urethra, but no sx today.  He is clearly better today than prev.  His sugar has been better controlled recently and unclear if that is related to the dec in symptoms.  D/w pt about prev CT:  1. No acute abnormalities involving the abdomen or pelvis. 2. 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 3. 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  He did get colonoscopy done.  He has h/o constipation.  He has some occ groin pain.    Meds, vitals, and allergies reviewed.   ROS: Per HPI unless specifically indicated in ROS section   GEN: nad, alert and oriented HEENT: mucous membranes moist NECK: supple w/o LA CV: rrr. PULM: ctab, no inc wob ABD: soft, +bs EXT: no edema Testes bilaterally descended.  No scrotal masses or lesions. No penis lesions or urethral discharge. I don't feel inguinal hernia on exam.  No visible hernia.

## 2016-06-13 NOTE — Progress Notes (Signed)
Pre visit review using our clinic review tool, if applicable. No additional management support is needed unless otherwise documented below in the visit note. 

## 2016-06-14 LAB — GC/CHLAMYDIA PROBE AMP
CT Probe RNA: NOT DETECTED
GC Probe RNA: NOT DETECTED

## 2016-06-14 LAB — RPR

## 2016-06-14 NOTE — Assessment & Plan Note (Signed)
Differential diagnosis discussed patient. Some of this could be coming from his back. It is unclear if some of his dysuria is related to hyperglycemia. Check labs for routine STD testing today. I don't feel a hernia on exam but he still have a small occult inguinal hernia. It is STD labs are normal and if his symptoms do not correlate with sugar and improve with better sugar control, then we can refer to urology. He agrees. He'll update me as needed.

## 2016-06-18 ENCOUNTER — Encounter: Payer: Self-pay | Admitting: Internal Medicine

## 2016-06-21 ENCOUNTER — Other Ambulatory Visit: Payer: Self-pay | Admitting: Family Medicine

## 2016-07-06 ENCOUNTER — Other Ambulatory Visit: Payer: Self-pay | Admitting: Family Medicine

## 2016-07-06 ENCOUNTER — Other Ambulatory Visit (INDEPENDENT_AMBULATORY_CARE_PROVIDER_SITE_OTHER): Payer: 59

## 2016-07-06 DIAGNOSIS — Z794 Long term (current) use of insulin: Secondary | ICD-10-CM

## 2016-07-06 DIAGNOSIS — E1140 Type 2 diabetes mellitus with diabetic neuropathy, unspecified: Secondary | ICD-10-CM

## 2016-07-06 LAB — HEMOGLOBIN A1C: Hgb A1c MFr Bld: 9 % — ABNORMAL HIGH (ref 4.6–6.5)

## 2016-07-06 LAB — COMPREHENSIVE METABOLIC PANEL
ALT: 24 U/L (ref 0–53)
AST: 20 U/L (ref 0–37)
Albumin: 3.9 g/dL (ref 3.5–5.2)
Alkaline Phosphatase: 62 U/L (ref 39–117)
BUN: 16 mg/dL (ref 6–23)
CALCIUM: 9.3 mg/dL (ref 8.4–10.5)
CHLORIDE: 103 meq/L (ref 96–112)
CO2: 28 meq/L (ref 19–32)
Creatinine, Ser: 0.84 mg/dL (ref 0.40–1.50)
GFR: 106.13 mL/min (ref >60.00)
GLUCOSE: 156 mg/dL — AB (ref 70–99)
POTASSIUM: 4.3 meq/L (ref 3.5–5.1)
Sodium: 138 mEq/L (ref 135–145)
Total Bilirubin: 0.4 mg/dL (ref 0.2–1.2)
Total Protein: 7.4 g/dL (ref 6.0–8.3)

## 2016-07-06 LAB — LIPID PANEL
CHOL/HDL RATIO: 6
Cholesterol: 200 mg/dL (ref 0–200)
HDL: 31 mg/dL — AB (ref >39.00)
LDL Cholesterol: 148 mg/dL — ABNORMAL HIGH (ref 0–99)
NONHDL: 168.99
Triglycerides: 106 mg/dL (ref 0.0–149.0)
VLDL: 21.2 mg/dL (ref 0.0–40.0)

## 2016-07-09 ENCOUNTER — Other Ambulatory Visit: Payer: 59

## 2016-07-16 ENCOUNTER — Encounter: Payer: Self-pay | Admitting: Family Medicine

## 2016-07-16 ENCOUNTER — Ambulatory Visit (INDEPENDENT_AMBULATORY_CARE_PROVIDER_SITE_OTHER): Payer: 59 | Admitting: Family Medicine

## 2016-07-16 VITALS — BP 132/80 | HR 88 | Temp 98.7°F | Ht 72.0 in | Wt 315.5 lb

## 2016-07-16 DIAGNOSIS — Z794 Long term (current) use of insulin: Secondary | ICD-10-CM

## 2016-07-16 DIAGNOSIS — M7712 Lateral epicondylitis, left elbow: Secondary | ICD-10-CM

## 2016-07-16 DIAGNOSIS — Z0000 Encounter for general adult medical examination without abnormal findings: Secondary | ICD-10-CM

## 2016-07-16 DIAGNOSIS — M7710 Lateral epicondylitis, unspecified elbow: Secondary | ICD-10-CM | POA: Insufficient documentation

## 2016-07-16 DIAGNOSIS — Z23 Encounter for immunization: Secondary | ICD-10-CM

## 2016-07-16 DIAGNOSIS — E1140 Type 2 diabetes mellitus with diabetic neuropathy, unspecified: Secondary | ICD-10-CM

## 2016-07-16 DIAGNOSIS — R1032 Left lower quadrant pain: Secondary | ICD-10-CM

## 2016-07-16 MED ORDER — GLUCOSE BLOOD VI STRP: ORAL_STRIP | 12 refills | Status: DC

## 2016-07-16 MED ORDER — INSULIN PEN NEEDLE 31G X 5 MM MISC: 3 refills | Status: DC

## 2016-07-16 NOTE — Assessment & Plan Note (Signed)
We talked about options, up to and including med changes, endocrine and nutrition referral.  He didn't want extra meds.  He has seen nutrition prev.  Seeing endo wouldn't change rec for diet and exercise, weight loss, and gradual inc insulin until AM sugars controlled.  Recheck in about 3 months.  He agrees.

## 2016-07-16 NOTE — Progress Notes (Signed)
Pre visit review using our clinic review tool, if applicable. No additional management support is needed unless otherwise documented below in the visit note. 

## 2016-07-16 NOTE — Patient Instructions (Addendum)
Use a tennis elbow strap and ice as needed.  Update me as needed.   Diet and exercise in the meantime.  Gradually increase your insulin as long as your AM sugar is >120. Recheck A1c in about 3 months.  Take care.  Glad to see you.

## 2016-07-16 NOTE — Progress Notes (Signed)
CPE- See plan.  Routine anticipatory guidance given to patient.  See health maintenance. Tetanus 2013 Flu today.  PNA today.  Shingles not due Colonoscopy 2017.  PSA not due.  Living will d/w pt. Would have his sister or mother equally designated.    Some L groin pain, worse with prolonged sitting, but better from prev.  He attributed some of the sx to constipation; better in the meantime.    Now with some L elbow pain.  Likely tennis elbow. D/w pt.  Some better recently.   Diabetes:  Using medications without difficulties:yes Hypoglycemic episodes:no Hyperglycemic episodes:no Feet problems: at baseline, no recent need for gabapentin for pain.  Blood Sugars averaging: ~150s on last few episodic checks eye exam within last year:yes, f/u pending.   A1c still up, d/w pt.   Diet and exercise d/w pt.  Working on diet, he is episodically adherent.  No exercise.   He is hoping work schedule changes will be beneficial, d/w pt.   PMH and SH reviewed  Meds, vitals, and allergies reviewed.   ROS: Per HPI.  Unless specifically indicated otherwise in HPI, the patient denies:  General: fever. Eyes: acute vision changes ENT: sore throat Cardiovascular: chest pain Respiratory: SOB GI: vomiting GU: dysuria Musculoskeletal: acute back pain Derm: acute rash Neuro: acute motor dysfunction Psych: worsening mood Endocrine: polydipsia Heme: bleeding Allergy: hayfever  GEN: nad, alert and oriented HEENT: mucous membranes moist NECK: supple w/o LA CV: rrr. PULM: ctab, no inc wob ABD: soft, +bs EXT: no edema SKIN: no acute rash  L tennis elbow on testing, at the lateral epicondyle, better with compression of proximal forearm.  Grip wnl o/w.  No elbow redness or bruising.    Diabetic foot exam: Normal inspection No skin breakdown No calluses  Normal DP pulses Normal sensation to light touch and monofilament Nails normal

## 2016-07-16 NOTE — Assessment & Plan Note (Signed)
D/w pt about icing and getting a tennis elbow brace.  F/u prn.  Likely related to driving, positional changes.  D/w pt.  He agrees.

## 2016-07-16 NOTE — Assessment & Plan Note (Signed)
Tetanus 2013 Flu today.  PNA today.  Shingles not due Colonoscopy 2017.  PSA not due.  Living will d/w pt. Would have his sister or mother equally designated.

## 2016-07-16 NOTE — Assessment & Plan Note (Signed)
Some better, appears to be positional, with prolonged sitting.  Would follow clinically, since some better in the meantime.  He agrees.

## 2016-10-01 ENCOUNTER — Ambulatory Visit (INDEPENDENT_AMBULATORY_CARE_PROVIDER_SITE_OTHER): Payer: 59 | Admitting: Family Medicine

## 2016-10-01 ENCOUNTER — Ambulatory Visit (INDEPENDENT_AMBULATORY_CARE_PROVIDER_SITE_OTHER)
Admission: RE | Admit: 2016-10-01 | Discharge: 2016-10-01 | Disposition: A | Discharge status: Home / Self Care | Payer: 59 | Source: Ambulatory Visit | Attending: Family Medicine | Admitting: Family Medicine

## 2016-10-01 ENCOUNTER — Encounter: Payer: Self-pay | Admitting: Family Medicine

## 2016-10-01 VITALS — BP 132/90 | HR 91 | Temp 98.6°F | Wt 317.5 lb

## 2016-10-01 DIAGNOSIS — M79601 Pain in right arm: Secondary | ICD-10-CM

## 2016-10-01 DIAGNOSIS — M79602 Pain in left arm: Secondary | ICD-10-CM

## 2016-10-01 DIAGNOSIS — M79641 Pain in right hand: Secondary | ICD-10-CM

## 2016-10-01 DIAGNOSIS — Z794 Long term (current) use of insulin: Secondary | ICD-10-CM

## 2016-10-01 DIAGNOSIS — E1140 Type 2 diabetes mellitus with diabetic neuropathy, unspecified: Secondary | ICD-10-CM | POA: Diagnosis not present

## 2016-10-01 MED ORDER — INSULIN GLARGINE 100 UNIT/ML SOLOSTAR PEN: 65.0000 [IU] | PEN_INJECTOR | Freq: Every day | SUBCUTANEOUS | 3 refills | Status: DC

## 2016-10-01 NOTE — Progress Notes (Signed)
DM2.  Getting 1 metformin everyday, occ missing second dose later in the day.  Still on insulin- 65 units a day.  Still with sugars up to ~200.  D/w pt about setting alarm re: metformin dose.   L arm pain at the L elbow.  Tried tennis elbow strap with some relief, but not a lot of relief.  Pain with lifting and  grabbing.  Now with similar pain the R elbow.  Also with pain with extremes of ROM for grip in the B hands, at B 2nd fingers and R 3rd finger also.    Some neck pain, got a massage last week with some relief.    PMH and SH reviewed  ROS: Per HPI unless specifically indicated in ROS section   Meds, vitals, and allergies reviewed.   nad ncat rrr ctab Neck supple, no LA, no midline pain.  S/S grossly wnl BUE, normal monofilament testing.  L>R pain with tennis elbow testing, improved on L side with tennis elbow strap use.   Normal radial pulse B Normal cap refill in the hands.  No tendon deficit on ROM of hand/fingers but pain with extremes of ROM for grip in the B hands, at B 2nd fingers and R 3rd finger also.   Tinel neg at the wrists B

## 2016-10-01 NOTE — Patient Instructions (Addendum)
2-3 advil up to 2-3 times per day with food.  Go to the lab on the way out.  We'll contact you with your xray report. We'll go from there.   Take care.  Glad to see you.

## 2016-10-01 NOTE — Progress Notes (Signed)
Pre visit review using our clinic review tool, if applicable. No additional management support is needed unless otherwise documented below in the visit note. 

## 2016-10-02 ENCOUNTER — Encounter: Payer: Self-pay | Admitting: Family Medicine

## 2016-10-02 DIAGNOSIS — M79602 Pain in left arm: Principal | ICD-10-CM

## 2016-10-02 DIAGNOSIS — M79601 Pain in right arm: Secondary | ICD-10-CM | POA: Insufficient documentation

## 2016-10-02 DIAGNOSIS — M79641 Pain in right hand: Secondary | ICD-10-CM | POA: Insufficient documentation

## 2016-10-02 NOTE — Assessment & Plan Note (Signed)
See discussion of arm pain.

## 2016-10-02 NOTE — Assessment & Plan Note (Signed)
He'll add on second dose of metformin and we can recheck later on. He agrees.

## 2016-10-02 NOTE — Assessment & Plan Note (Addendum)
New/worsening issue. Needs workup. He does not have symptoms that I can clearly attributed to neuropathy at this point, though that is still possible with his h/o DM2. Normal monofilament testing on the bilateral hands. He does not have joint erythema. Tinel testing at the wrist is negative bilaterally. He likely has a combination of issues. He may have bilateral, left greater than right, tennis elbow. Discussed with patient about that. He could have tendinitis in his hands. He also could have a contribution from his neck, potentially radiating down into the arms. Reasonable to check plain films of the neck today and also images right hand just to make sure there is no significant underlying arthropathy in either location. He agrees. See notes on imaging. Okay to use ibuprofen. See after visit summary.

## 2016-10-03 LAB — HM DIABETES EYE EXAM

## 2016-10-09 ENCOUNTER — Encounter: Payer: Self-pay | Admitting: Family Medicine

## 2016-12-08 ENCOUNTER — Other Ambulatory Visit: Payer: Self-pay | Admitting: Family Medicine

## 2017-01-08 DIAGNOSIS — R319 Hematuria, unspecified: Secondary | ICD-10-CM | POA: Diagnosis not present

## 2017-01-08 DIAGNOSIS — N390 Urinary tract infection, site not specified: Secondary | ICD-10-CM | POA: Diagnosis not present

## 2017-01-15 ENCOUNTER — Ambulatory Visit (INDEPENDENT_AMBULATORY_CARE_PROVIDER_SITE_OTHER): Payer: 59 | Admitting: Internal Medicine

## 2017-01-15 ENCOUNTER — Encounter: Payer: Self-pay | Admitting: Internal Medicine

## 2017-01-15 VITALS — BP 130/84 | HR 88 | Temp 97.9°F | Wt 309.0 lb

## 2017-01-15 DIAGNOSIS — N3001 Acute cystitis with hematuria: Secondary | ICD-10-CM | POA: Diagnosis not present

## 2017-01-15 DIAGNOSIS — R300 Dysuria: Secondary | ICD-10-CM | POA: Diagnosis not present

## 2017-01-15 DIAGNOSIS — R319 Hematuria, unspecified: Secondary | ICD-10-CM | POA: Diagnosis not present

## 2017-01-15 LAB — POC URINALSYSI DIPSTICK (AUTOMATED)
BILIRUBIN UA: NEGATIVE
Glucose, UA: NEGATIVE
LEUKOCYTES UA: NEGATIVE
Nitrite, UA: NEGATIVE
PH UA: 6 (ref 5.0–8.0)
SPEC GRAV UA: 1.03 (ref 1.030–1.035)
Urobilinogen, UA: NEGATIVE (ref ?–2.0)

## 2017-01-15 NOTE — Patient Instructions (Signed)
Urinary Tract Infection, Adult °A urinary tract infection (UTI) is an infection of any part of the urinary tract. The urinary tract includes the: °· Kidneys. °· Ureters. °· Bladder. °· Urethra. °These organs make, store, and get rid of pee (urine) in the body. °Follow these instructions at home: °· Take over-the-counter and prescription medicines only as told by your doctor. °· If you were prescribed an antibiotic medicine, take it as told by your doctor. Do not stop taking the antibiotic even if you start to feel better. °· Avoid the following drinks: °¨ Alcohol. °¨ Caffeine. °¨ Tea. °¨ Carbonated drinks. °· Drink enough fluid to keep your pee clear or pale yellow. °· Keep all follow-up visits as told by your doctor. This is important. °· Make sure to: °¨ Empty your bladder often and completely. Do not to hold pee for long periods of time. °¨ Empty your bladder before and after sex. °¨ Wipe from front to back after a bowel movement if you are male. Use each tissue one time when you wipe. °Contact a doctor if: °· You have back pain. °· You have a fever. °· You feel sick to your stomach (nauseous). °· You throw up (vomit). °· Your symptoms do not get better after 3 days. °· Your symptoms go away and then come back. °Get help right away if: °· You have very bad back pain. °· You have very bad lower belly (abdominal) pain. °· You are throwing up and cannot keep down any medicines or water. °This information is not intended to replace advice given to you by your health care provider. Make sure you discuss any questions you have with your health care provider. °Document Released: 03/26/2008 Document Revised: 03/15/2016 Document Reviewed: 08/29/2015 °Elsevier Interactive Patient Education © 2017 [employer] ° °

## 2017-01-15 NOTE — Progress Notes (Signed)
HPI  Pt presents to the clinic today with c/o dysuria and hematuria. He reports this started 1 week ago. He has associated pain in his lower abdomen. He was seen at [facility]. He was diagnosed with a UTI and prescribed Cefdinir. He reports he still has 3 days left. He reports he feels like his symptoms have improved, but he is nervous because he still saw blood in his urine this am. He denies fever, chills or body aches. He has been seen in the past for similar issues, no STD's or kidney stones. He has seen urology in the past 2 years ago, his workup at that time was negative.   Review of Systems  Past Medical History:  Diagnosis Date  . Carpal tunnel syndrome   . Diabetes mellitus   . Family history of polyps in the colon   . Fatty liver   . GERD (gastroesophageal reflux disease)   . History of chicken pox   . Hyperlipidemia    no meds now 05-28-16  . Obesity   . Sleep apnea    not using CPAP at this time 05-28-16    Family History  Problem Relation Age of Onset  . Diabetes Mother   . Diabetes Father   . Prostate cancer Father     dx'd in his early 24s  . Hyperlipidemia Brother   . Colon cancer Paternal Uncle     dx'd near age 31  . Diabetes Maternal Uncle   . Esophageal cancer Neg Hx   . Stomach cancer Neg Hx   . Rectal cancer Neg Hx     Social History   Social History  . Marital status: Single    Spouse name: N/A  . Number of children: N/A  . Years of education: N/A   Occupational History  . Not on file.   Social History Main Topics  . Smoking status: Current Every Day Smoker    Packs/day: 1.00    Years: 20.00    Types: Cigarettes  . Smokeless tobacco: Never Used  . Alcohol use Yes     Comment: 10-15 per week, usually on the weekends  . Drug use: No  . Sexual activity: Not on file   Other Topics Concern  . 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 Other Anaphylaxis    Bee stings  . Chantix [Varenicline Tartrate]     irritable  . Crestor [Rosuvastatin Calcium]     myalgia  . Januvia [Sitagliptin [place] in LFTs. Occurred with janumet, but prev tolerated metformin alone  . Lipitor [Atorvastatin Calcium]     myalgia  . Lidocaine Rash    Questionable     Constitutional: Denies fever, malaise, fatigue, headache or abrupt weight changes.   GU: Pt reports dysuria and blood in her urine. Denies urgency, frequency, burning sensation, odor or discharge.   No other specific complaints in a complete review of systems (except as listed in HPI above).    Objective:   Physical Exam  BP 130/84   Pulse 88   Temp 97.9 F (36.6 C) (Oral)   Wt (!) 309 lb (140.2 kg)   SpO2 97%   BMI 41.91 kg/m  Wt Readings from Last 3 Encounters:  01/15/17 (!) 309 lb (140.2 kg)  10/01/16 (!) 317 lb 8 oz (144  kg)  07/16/16 (!) 315 lb 8 oz (143.1 kg)    General: Appears his stated age, obese in NAD. Abdomen: Soft and nontender. Normal bowel sounds. No distention or masses noted.  No CVA tenderness. Rectal: Prostate not enlarged or tender. Normal rectal tone.      Assessment & Plan:   Hematuria and Dysuria secondary to UTI:  Urinalysis: trace blood Will send urine culture Continue Cefdinir for now Drink plenty of fluids If culture normal, we may have you follow up with urology  RTC as needed or if symptoms persist. Webb Silversmith, NP

## 2017-01-16 LAB — URINE CULTURE: Organism ID, Bacteria: NO GROWTH

## 2017-01-29 DIAGNOSIS — R1084 Generalized abdominal pain: Secondary | ICD-10-CM | POA: Diagnosis not present

## 2017-01-29 DIAGNOSIS — R3129 Other microscopic hematuria: Secondary | ICD-10-CM | POA: Diagnosis not present

## 2017-02-27 ENCOUNTER — Ambulatory Visit (INDEPENDENT_AMBULATORY_CARE_PROVIDER_SITE_OTHER): Payer: 59 | Admitting: Family Medicine

## 2017-02-27 ENCOUNTER — Encounter: Payer: Self-pay | Admitting: Family Medicine

## 2017-02-27 DIAGNOSIS — J069 Acute upper respiratory infection, unspecified: Secondary | ICD-10-CM

## 2017-02-27 DIAGNOSIS — E1140 Type 2 diabetes mellitus with diabetic neuropathy, unspecified: Secondary | ICD-10-CM

## 2017-02-27 DIAGNOSIS — Z794 Long term (current) use of insulin: Secondary | ICD-10-CM | POA: Diagnosis not present

## 2017-02-27 MED ORDER — DOXYCYCLINE HYCLATE 100 MG PO TABS: 100.0000 mg | ORAL_TABLET | Freq: Two times a day (BID) | ORAL | 0 refills | Status: DC

## 2017-02-27 MED ORDER — INSULIN GLARGINE 100 UNIT/ML SOLOSTAR PEN: PEN_INJECTOR | SUBCUTANEOUS | 3 refills | Status: DC

## 2017-02-27 MED ORDER — HYDROCODONE-HOMATROPINE 5-1.5 MG/5ML PO SYRP: 5.0000 mL | ORAL_SOLUTION | Freq: Three times a day (TID) | ORAL | 0 refills | Status: DC | PRN

## 2017-02-27 MED ORDER — BENZONATATE 200 MG PO CAPS: 200.0000 mg | ORAL_CAPSULE | Freq: Three times a day (TID) | ORAL | 0 refills | Status: DC | PRN

## 2017-02-27 NOTE — Progress Notes (Signed)
duration of symptoms: about 1 week rhinorrhea:yes congestion:yes ear pain:not painful but stuffy L eye irritation/discharge prev noted.  sore throat: yes cough:yes myalgias: no Fever: no Fatigue: yes  Diabetes:  Using medications without difficulties:yes Hypoglycemic episodes:no Hyperglycemic episodes:no Feet problems: able to tolerate w/o gabapentin.  Blood Sugars averaging: ~200 eye exam within last year: yes Due for labs.    Still travelling for work.   Off gabapentin in the meantime.    No more abd pain.  No more blood in urine.    Per HPI unless specifically indicated in ROS section   Meds, vitals, and allergies reviewed.   GEN: nad, alert and oriented HEENT: mucous membranes moist, TM w/o erythema, nasal epithelium injected, OP with cobblestoning, sinuses not ttp x4 NECK: supple w/o LA CV: rrr. PULM: ctab, no inc wob ABD: soft, +bs EXT: no edema

## 2017-02-27 NOTE — Assessment & Plan Note (Addendum)
See notes on labs.  No change in medications at this point.

## 2017-02-27 NOTE — Patient Instructions (Addendum)
Go to the lab on the way out.  We'll contact you with your lab report. Drink plenty of fluids, take tylenol as needed, and gargle with warm salt water for your throat.  Tessalon for cough.  If cough continues to get worse, then use hycodan and doxycycline.  This should gradually improve.  Take care.  Let us know if you have other concerns.

## 2017-02-27 NOTE — Progress Notes (Signed)
Pre visit review using our clinic review tool, if applicable. No additional management support is needed unless otherwise documented below in the visit note. 

## 2017-02-28 ENCOUNTER — Other Ambulatory Visit: Payer: Self-pay | Admitting: Family Medicine

## 2017-02-28 DIAGNOSIS — Z794 Long term (current) use of insulin: Principal | ICD-10-CM

## 2017-02-28 DIAGNOSIS — E1140 Type 2 diabetes mellitus with diabetic neuropathy, unspecified: Secondary | ICD-10-CM

## 2017-02-28 LAB — MICROALBUMIN / CREATININE URINE RATIO
CREATININE, U: 156.8 mg/dL
MICROALB UR: 6.4 mg/dL — AB (ref 0.0–1.9)
Microalb Creat Ratio: 4.1 mg/g (ref 0.0–30.0)

## 2017-02-28 LAB — HEMOGLOBIN A1C: Hgb A1c MFr Bld: 9.2 % — ABNORMAL HIGH (ref 4.6–6.5)

## 2017-02-28 MED ORDER — LISINOPRIL 2.5 MG PO TABS: 2.5000 mg | ORAL_TABLET | Freq: Every day | ORAL | 3 refills | Status: DC

## 2017-02-28 NOTE — Assessment & Plan Note (Signed)
Likely viral. Nontoxic. Discussed with patient about options. Use Tessalon for cough. If cough continues can change over to [facility] with routine cautions. If cough still continues with sputum then at that point would be reasonable to start doxycycline given duration. Update me as needed. He agrees.

## 2017-03-01 ENCOUNTER — Other Ambulatory Visit: Payer: Self-pay

## 2017-03-01 DIAGNOSIS — E1140 Type 2 diabetes mellitus with diabetic neuropathy, unspecified: Secondary | ICD-10-CM

## 2017-03-01 DIAGNOSIS — Z794 Long term (current) use of insulin: Principal | ICD-10-CM

## 2017-03-01 MED ORDER — LISINOPRIL 2.5 MG PO TABS: 2.5000 mg | ORAL_TABLET | Freq: Every day | ORAL | 3 refills | Status: DC

## 2017-05-10 ENCOUNTER — Ambulatory Visit (INDEPENDENT_AMBULATORY_CARE_PROVIDER_SITE_OTHER): Payer: 59 | Admitting: Internal Medicine

## 2017-05-10 ENCOUNTER — Encounter: Payer: Self-pay | Admitting: Internal Medicine

## 2017-05-10 VITALS — BP 134/80 | HR 84 | Ht 72.0 in | Wt 310.0 lb

## 2017-05-10 DIAGNOSIS — E1140 Type 2 diabetes mellitus with diabetic neuropathy, unspecified: Secondary | ICD-10-CM

## 2017-05-10 DIAGNOSIS — Z794 Long term (current) use of insulin: Secondary | ICD-10-CM | POA: Diagnosis not present

## 2017-05-10 LAB — POCT GLYCOSYLATED HEMOGLOBIN (HGB A1C): HEMOGLOBIN A1C: 8.8

## 2017-05-10 MED ORDER — CANAGLIFLOZIN 100 MG PO TABS: 100.0000 mg | ORAL_TABLET | Freq: Every day | ORAL | 5 refills | Status: DC

## 2017-05-10 MED ORDER — METFORMIN HCL 1000 MG PO TABS: 1000.0000 mg | ORAL_TABLET | Freq: Two times a day (BID) | ORAL | 3 refills | Status: DC

## 2017-05-10 MED ORDER — INSULIN DEGLUDEC 200 UNIT/ML ~~LOC~~ SOPN: 64.0000 [IU] | PEN_INJECTOR | Freq: Every day | SUBCUTANEOUS | 3 refills | Status: DC

## 2017-05-10 MED ORDER — FREESTYLE LIBRE READER DEVI: 1.0000 | Freq: Three times a day (TID) | 1 refills | Status: DC

## 2017-05-10 MED ORDER — FREESTYLE LIBRE SENSOR SYSTEM MISC: 1.0000 | 11 refills | Status: DC

## 2017-05-10 NOTE — Progress Notes (Signed)
Patient ID: Jerry Eaton, male   DOB: Jun 01, 1971, 46 y.o.   MRN: [id number]   HPI: Jerry Eaton is a 46 y.o.-year-old male, referred by his PCP, Dr. Damita Dunnings, for management of DM2, dx in ~2013, insulin-dependent since 2016-2017, uncontrolled, with complications (PN).  Last hemoglobin A1c was: Lab Results  Component Value Date   HGBA1C 9.2 (H) 02/27/2017   HGBA1C 9.0 (H) 07/06/2016   HGBA1C 9.2 (H) 04/02/2016   Pt is on a regimen of: - Metformin 500 mg 1x a day, with meals (Rx'ed BID, forgets second dose) - Lantus 65 units at bedtime  Tried JanuMet >> transaminitis.  Pt checks his sugars 0-1 a day and they are: - am: 126, 160-180, 220 - 2h after b'fast: n/c - before lunch: n/c - 2h after lunch: n/c - before dinner: n/c - 2h after dinner: n/c - bedtime: n/c - nighttime: n/c No lows. Lowest sugar was 126; he has hypoglycemia awareness at 70.  Highest sugar was 200s.  Glucometer: [employer] IQ  Pt's meals are: - Breakfast: bisquits - Lunch: sandwich, burgers (may skip) - Dinner: burgers, chicken, steak - Snacks: 0-1  He is traveling a lot for work.  - no CKD, last BUN/creatinine:  Lab Results  Component Value Date   BUN 16 07/06/2016   BUN 11 04/16/2016   CREATININE 0.84 07/06/2016   CREATININE 0.78 04/16/2016   Lab Results  Component Value Date   MICRALBCREAT 4.1 02/27/2017   MICRALBCREAT 0.6 09/19/2015   MICRALBCREAT 0.7 07/26/2014   On lisinopril 2.5 -started 04/2017.  - He has a history of hyperlipidemia. Last set of lipids: Lab Results  Component Value Date   CHOL 200 07/06/2016   HDL 31.00 (L) 07/06/2016   LDLCALC 148 (H) 07/06/2016   LDLDIRECT 163.5 05/16/2012   TRIG 106.0 07/06/2016   CHOLHDL 6 07/06/2016  Not on statin >> Tried Crestor and Lipitor but developed joint pain and mm pain. He has been on CoQ10 in the past -  last eye exam was in 09/2016. No DR.  - + numbness and tingling in his feet. Was on gabapentin.   Pt has FH of DM in  Mother, father, M uncle.     He travels to the [place] once a year to meet with his fiance. He just returned this past weekend.  ROS: Constitutional: no weight gain/loss, no fatigue, no subjective hyperthermia/hypothermia, + burning with urination Eyes: no blurry vision, no xerophthalmia ENT: no sore throat, no nodules palpated in throat, no dysphagia/odynophagia, no hoarseness Cardiovascular: no CP/SOB/palpitations/leg swelling Respiratory: no cough/SOB Gastrointestinal: no N/V/D/C/+ acid reflux Musculoskeletal: no muscle/joint aches Skin: no rashes Neurological: no tremors/numbness/tingling/dizziness Psychiatric: no depression/anxiety  Past Medical History:  Diagnosis Date  . Carpal tunnel syndrome   . Diabetes mellitus   . Family history of polyps in the colon   . Fatty liver   . GERD (gastroesophageal reflux disease)   . History of chicken pox   . Hyperlipidemia    no meds now 05-28-16  . Obesity   . Sleep apnea    not using CPAP at this time 05-28-16   Past Surgical History:  Procedure Laterality Date  . COLONOSCOPY     2012, 2017  . LUMBAR DISC SURGERY     L4/L5  . PILONIDAL CYST EXCISION     2001   Social History   Social History  . Marital status: Single    Spouse name: N/A  . Number of children: 0  Social History Main Topics  . Smoking status: Current Every Day Smoker    Packs/day: 1.00    Years: 20.00    Types: Cigarettes  . Smokeless tobacco: Never Used  . Alcohol use Yes     Comment: 10-15 per week, usually on the weekends  . 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 glucose blood test strip Check sugar before and 2 hours after meals. Okay to check >3 times per day- insulin treated, uncontrolled DM2. 100 each 12  . ibuprofen (ADVIL,MOTRIN) 200 MG tablet Take  400-600 mg by mouth 3 (three) times daily as needed. With food.      . Insulin Glargine (LANTUS SOLOSTAR) 100 UNIT/ML Solostar Pen INJECT 65 UNITS INTO THE SKIN DAILY. 25 pen 3  . Insulin Pen Needle (B-D UF III MINI PEN NEEDLES) 31G X 5 MM MISC USE DAILY WITH INSULIN PEN, DX DIABETES 100 each 3  . lisinopril (ZESTRIL) 2.5 MG tablet Take 1 tablet (2.5 mg total) by mouth daily. 90 tablet 3  . metFORMIN (GLUCOPHAGE) 500 MG tablet Take 1 tablet (500 mg total) by mouth 2 (two) times daily with a meal. 180 tablet 3  . ranitidine (ZANTAC) 150 MG tablet Take 150 mg by mouth as needed.      . benzonatate (TESSALON) 200 MG capsule Take 1 capsule (200 mg total) by mouth 3 (three) times daily as needed. (Patient not taking: Reported on 05/10/2017) 30 capsule 0  . HYDROcodone-homatropine (HYCODAN) 5-1.5 MG/5ML syrup Take 5 mLs by mouth every 8 (eight) hours as needed for cough. (Patient not taking: Reported on 05/10/2017) 120 mL 0  . Melatonin 5 MG TABS Take 1 tablet by mouth at bedtime and may repeat dose one time if needed.     Current Facility-Administered Medications on File Prior to Visit  Medication Dose Route Frequency Provider Last Rate Last Dose  . 0.9 %  sodium chloride infusion  500 mL Intravenous Continuous Irene Shipper, MD       Allergies  Allergen Reactions  . Other Anaphylaxis    Bee stings  . Chantix [Varenicline Tartrate]     irritable  . Crestor [Rosuvastatin Calcium]     myalgia  . Januvia [Sitagliptin [place] in LFTs. Occurred with janumet, but prev tolerated metformin alone  . Lipitor [Atorvastatin Calcium]     myalgia  . Lidocaine Rash    Questionable   Family History  Problem Relation Age of Onset  . Diabetes Mother   . Diabetes Father   . Prostate cancer Father        dx'd in his early 19s  . Hyperlipidemia Brother   . Colon cancer Paternal Uncle        dx'd near age 76  . Diabetes Maternal Uncle   . Esophageal cancer Neg Hx   . Stomach cancer Neg Hx   .  Rectal cancer Neg Hx    PE: BP 134/80 (BP Location: Left Arm, Patient Position: Sitting)   Pulse 84   Ht 6' (1.829 m)   Wt (!) 310 lb (140.6 kg)   SpO2 97%   BMI 42.04 kg/m  Wt Readings from Last 3 Encounters:  05/10/17 (!) 310 lb (140.6 kg)  02/27/17 (!) 308 lb 4 oz (139.8 kg)  01/15/17 (!) 309 lb (140.2 kg)   Constitutional: Obese, in NAD Eyes: PERRLA, EOMI, no exophthalmos ENT: moist mucous membranes, no thyromegaly, no cervical lymphadenopathy Cardiovascular: RRR, No MRG Respiratory: CTA B Gastrointestinal: abdomen soft, NT, ND, BS+ Musculoskeletal: no deformities, strength intact in all 4 Skin: moist, warm, no rashes Neurological: no tremor with outstretched hands, DTR normal in all 4  ASSESSMENT: 1. DM2, insulin-dependent, uncontrolled, with complications - PN  PLAN:  1. Patient with long-standing, uncontrolled diabetes, on oral antidiabetic regimen and basal insulin , which became insufficient. The HbA1c today is slightly better, at 8.8%. He is on a larger dose of basal insulin, Lantus, with still high sugars in the morning. He also complains about burning at the injection site and the fact that it is sometimes hard to inject the insulin. He has a history of refusing insulin needles in the past, he is not doing this anymore. He is on metformin, but he forgets to take it twice a day, and ends up only taking it once a day.  -  We discussed about changes in his regimen: We'll switch from Lantus to [place] U200 since this is not as acidic and is more concentrated. Toujeo does not appear to be covered by his insurance. He was given a coupon for [place]. I also suggested to increase metformin to 1000 mg once a day at night and at next visit we may need to increase it to twice a day. Will also add an SGLT2 inhibitor and I advised him to stay hydrated while he is using this. At next visit we need to check a BMP.  - we discussed about the new FreeStyle libre CGM and sent the  prescription to his pharmacy. He was initially reticent about it since he would not want his diabetes status to be known, But after our discussion, he would really like to try it  - I suggested to:  Patient Instructions  Please increase: - Metformin to 1000 mg with dinner  Please change: - Lantus to [place] U200 64 units at bedtime  Please add: - Invokana 100 mg in am, before b'fast  Please return in 1.5 months with your sugar log.   - Strongly advised him to start checking sugars at different times of the day - check 2 times a day, rotating checks. - given sugar log and advised how to fill it and to bring it at next appt  - given foot care handout and explained the principles  - given instructions for hypoglycemia management "15-15 rule"  - advised for yearly eye exams >> he is up-to-date    - Return to clinic in 1.5 mo with sugar log   Philemon Kingdom, MD PhD [facility] Endocrinology

## 2017-05-10 NOTE — Patient Instructions (Addendum)
Please increase: - Metformin to 1000 mg with dinner  Please change: - Lantus to [place]res[place] U200 64 units at bedtime  Please add: - Invokana 100 mg in am, before b'fast  Please return in 1.5 months with your sugar log.   PATIENT INSTRUCTIONS FOR TYPE 2 DIABETES:  **Please join MyChart!** - see attached instructions about how to join if you have not done so already.  DIET AND EXERCISE Diet and exercise is an important part of diabetic treatment.  We recommended aerobic exercise in the form of brisk walking (working between 40-60% of maximal aerobic capacity, similar to brisk walking) for 150 minutes per week (such as 30 minutes five days per week) along with 3 times per week performing 'resistance' training (using various gauge rubber tubes with handles) 5-10 exercises involving the major muscle groups (upper body, lower body and core) performing 10-15 repetitions (or near fatigue) each exercise. Start at half the above goal but build slowly to reach the above goals. If limited by weight, joint pain, or disability, we recommend daily walking in a swimming pool with water up to waist to reduce pressure from joints while allow for adequate exercise.    BLOOD GLUCOSES Monitoring your blood glucoses is important for continued management of your diabetes. Please check your blood glucoses 2-4 times a day: fasting, before meals and at bedtime (you can rotate these measurements - e.g. one day check before the 3 meals, the next day check before 2 of the meals and before bedtime, etc.).   HYPOGLYCEMIA (low blood sugar) Hypoglycemia is usually a reaction to not eating, exercising, or taking too much insulin/ other diabetes drugs.  Symptoms include tremors, sweating, hunger, confusion, headache, etc. Treat IMMEDIATELY with 15 grams of Carbs: . 4 glucose tablets .  cup regular juice/soda . 2 tablespoons raisins . 4 teaspoons sugar . 1 tablespoon honey Recheck blood glucose in 15 mins and repeat above  if still symptomatic/blood glucose <100.  RECOMMENDATIONS TO REDUCE YOUR RISK OF DIABETIC COMPLICATIONS: * Take your prescribed MEDICATION(S) * Follow a DIABETIC diet: Complex carbs, fiber rich foods, (monounsaturated and polyunsaturated) fats * AVOID saturated/trans fats, high fat foods, >2,300 mg salt per day. * EXERCISE at least 5 times a week for 30 minutes or preferably daily.  * DO NOT SMOKE OR DRINK more than 1 drink a day. * Check your FEET every day. Do not wear tightfitting shoes. Contact us if you develop an ulcer * See your EYE doctor once a year or more if needed * Get a FLU shot once a year * Get a PNEUMONIA vaccine once before and once after age 44 years  GOALS:  * Your Hemoglobin A1c of <7%  * fasting sugars need to be <130 * after meals sugars need to be <180 (2h after you start eating) * Your Systolic BP should be 836 or lower  * Your Diastolic BP should be 80 or lower  * Your HDL (Good Cholesterol) should be 40 or higher  * Your LDL (Bad Cholesterol) should be 100 or lower. * Your Triglycerides should be 150 or lower  * Your Urine microalbumin (kidney function) should be <30 * Your Body Mass Index should be 25 or lower    Please consider the following ways to cut down carbs and fat and increase fiber and micronutrients in your diet: - substitute whole grain for white bread or pasta - substitute brown rice for white rice - substitute 90-calorie flat bread pieces for slices of bread when  possible - substitute sweet potatoes or yams for white potatoes - substitute humus for margarine - substitute tofu for cheese when possible - substitute almond or rice milk for regular milk (would not drink soy milk daily due to concern for soy estrogen influence on breast cancer risk) - substitute dark chocolate for other sweets when possible - substitute water - can add lemon or orange slices for taste - for diet sodas (artificial sweeteners will trick your body that you can eat  sweets without getting calories and will lead you to overeating and weight gain in the long run) - do not skip breakfast or other meals (this will slow down the metabolism and will result in more weight gain over time)  - can try smoothies made from fruit and almond/rice milk in am instead of regular breakfast - can also try old-fashioned (not instant) oatmeal made with almond/rice milk in am - order the dressing on the side when eating salad at a restaurant (pour less than half of the dressing on the salad) - eat as little meat as possible - can try juicing, but should not forget that juicing will get rid of the fiber, so would alternate with eating raw veg./fruits or drinking smoothies - use as little oil as possible, even when using olive oil - can dress a salad with a mix of balsamic vinegar and lemon juice, for e.g. - use agave nectar, stevia sugar, or regular sugar rather than artificial sweateners - steam or broil/roast veggies  - snack on veggies/fruit/nuts (unsalted, preferably) when possible, rather than processed foods - reduce or eliminate aspartame in diet (it is in diet sodas, chewing gum, etc) Read the labels!  Try to read Dr. Janene Harvey book: "Program for Reversing Diabetes" for other ideas for healthy eating.

## 2017-05-10 NOTE — Addendum Note (Signed)
Addended by: Caprice Beaver T on: 05/10/2017 02:36 PM   Modules accepted: Orders

## 2017-06-05 ENCOUNTER — Ambulatory Visit: Payer: 59 | Admitting: Internal Medicine

## 2017-06-16 IMAGING — DX DG HAND COMPLETE 3+V*R*
3 series · 3 of 3 positions shown · non-contrast
Comparison: No recent prior.

CLINICAL DATA: Hand pain.

EXAM:
RIGHT HAND - COMPLETE 3+ VIEW

[hand ap]
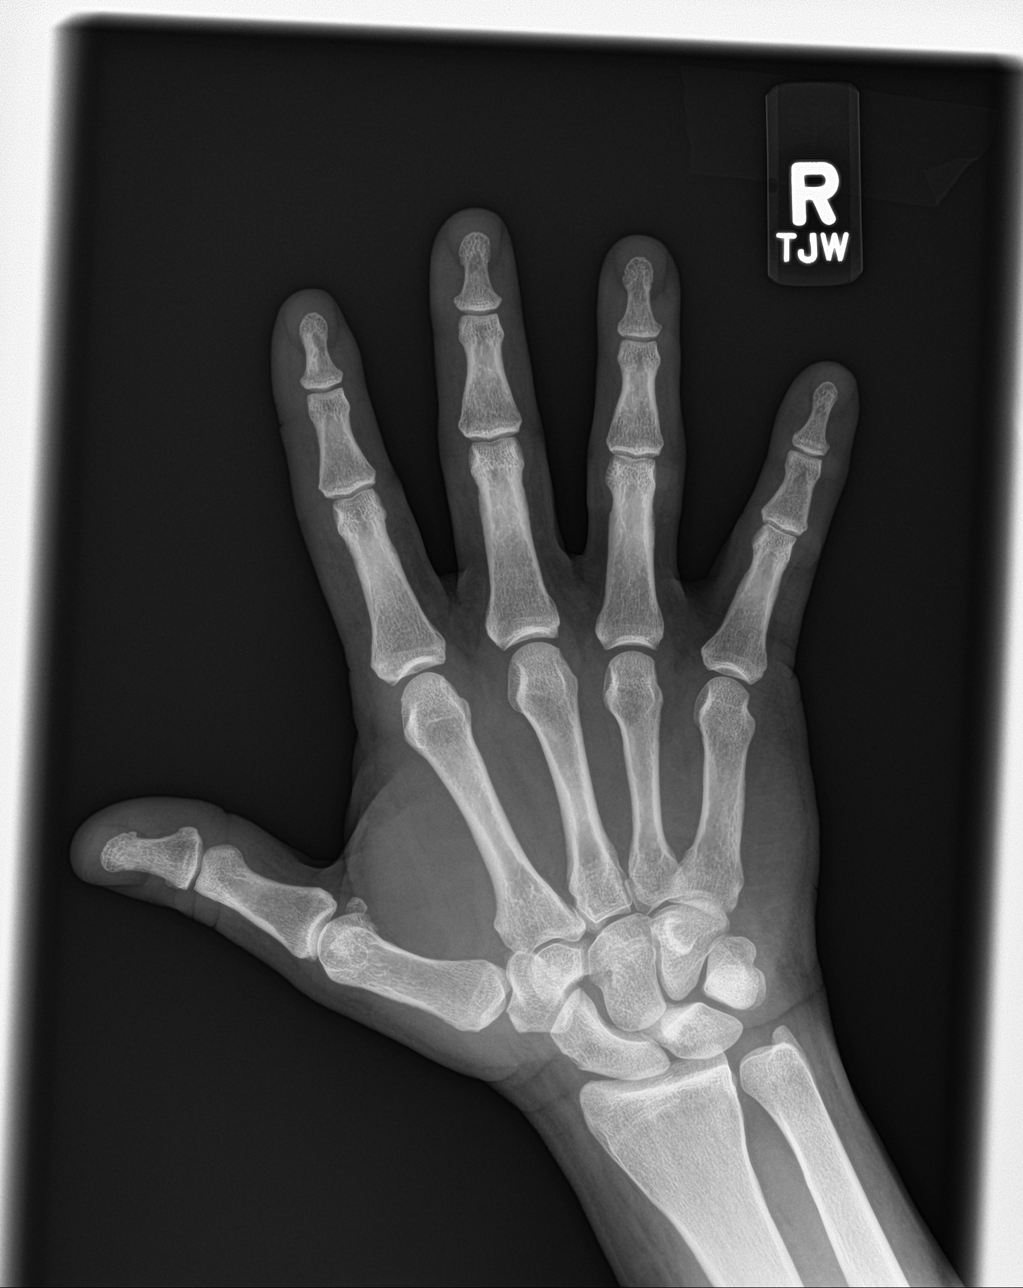

[hand obl]
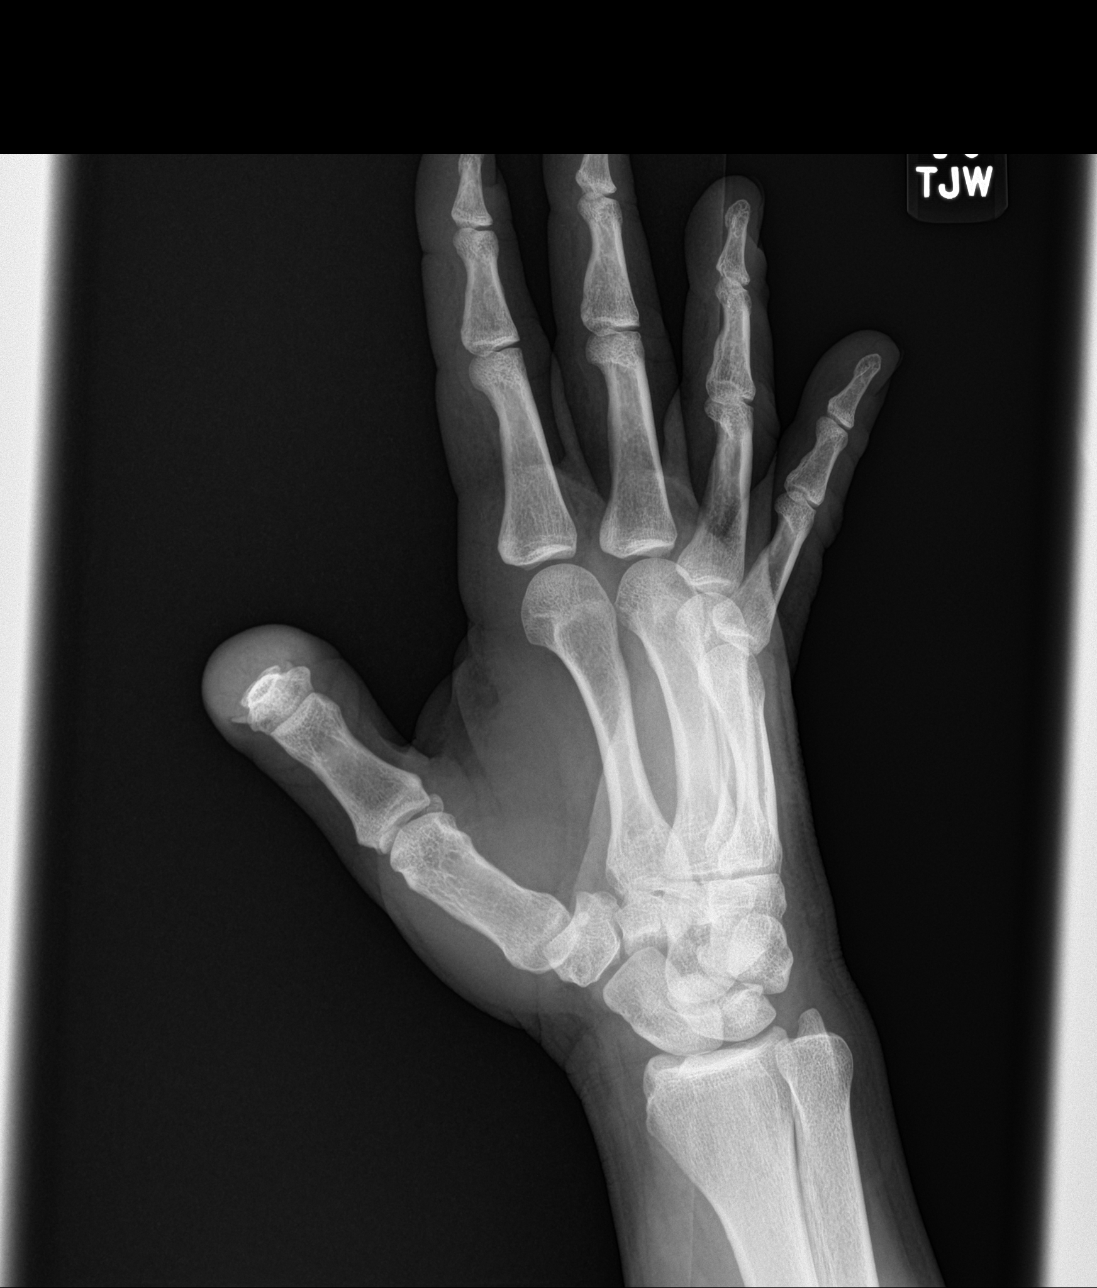

[hand lat]
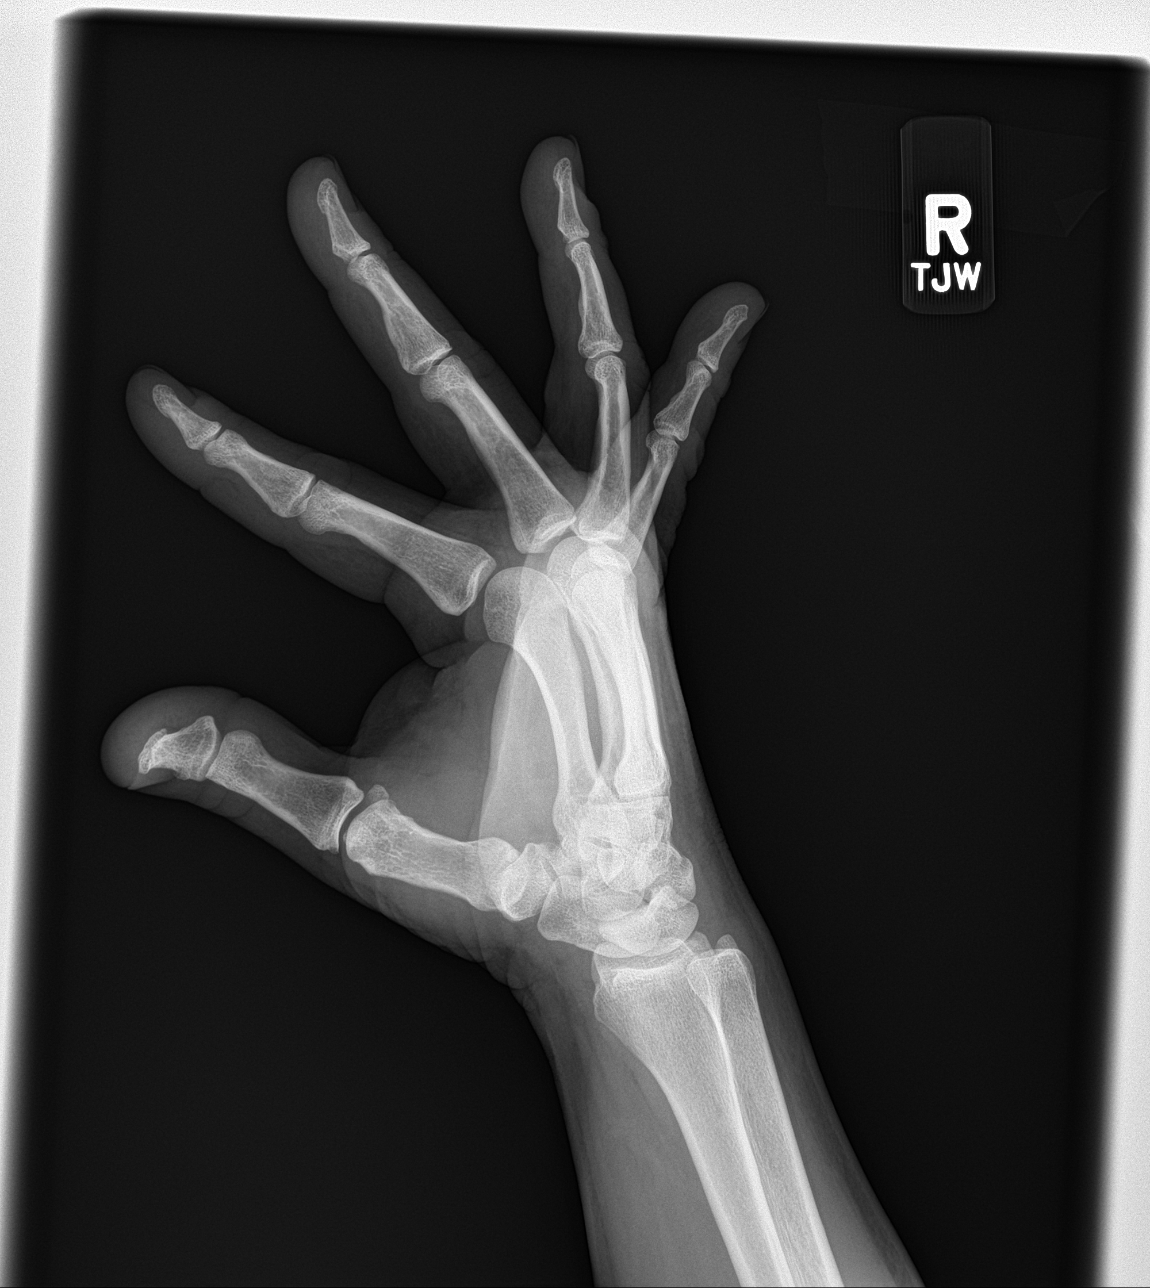

[3 of 3 positions shown; findings below may reference images not displayed]

FINDINGS: No acute bony or joint abnormality identified. No evidence fracture
or dislocation.
IMPRESSION: No acute abnormality identified.

## 2017-07-01 ENCOUNTER — Other Ambulatory Visit (INDEPENDENT_AMBULATORY_CARE_PROVIDER_SITE_OTHER): Payer: 59

## 2017-07-01 ENCOUNTER — Encounter: Payer: Self-pay | Admitting: Internal Medicine

## 2017-07-01 ENCOUNTER — Ambulatory Visit (INDEPENDENT_AMBULATORY_CARE_PROVIDER_SITE_OTHER): Payer: 59 | Admitting: Internal Medicine

## 2017-07-01 VITALS — BP 144/90 | HR 106 | Wt 305.0 lb

## 2017-07-01 DIAGNOSIS — E784 Other hyperlipidemia: Secondary | ICD-10-CM

## 2017-07-01 DIAGNOSIS — Z794 Long term (current) use of insulin: Secondary | ICD-10-CM | POA: Diagnosis not present

## 2017-07-01 DIAGNOSIS — E7849 Other hyperlipidemia: Secondary | ICD-10-CM

## 2017-07-01 DIAGNOSIS — Z23 Encounter for immunization: Secondary | ICD-10-CM | POA: Diagnosis not present

## 2017-07-01 DIAGNOSIS — E1140 Type 2 diabetes mellitus with diabetic neuropathy, unspecified: Secondary | ICD-10-CM

## 2017-07-01 LAB — COMPREHENSIVE METABOLIC PANEL
ALBUMIN: 4.4 g/dL (ref 3.5–5.2)
ALK PHOS: 56 U/L (ref 39–117)
ALT: 25 U/L (ref 0–53)
AST: 24 U/L (ref 0–37)
BILIRUBIN TOTAL: 0.4 mg/dL (ref 0.2–1.2)
BUN: 12 mg/dL (ref 6–23)
CALCIUM: 10 mg/dL (ref 8.4–10.5)
CO2: 23 meq/L (ref 19–32)
Chloride: 102 mEq/L (ref 96–112)
Creatinine, Ser: 0.79 mg/dL (ref 0.40–1.50)
GFR: 113.39 mL/min (ref >60.00)
Glucose, Bld: 153 mg/dL — ABNORMAL HIGH (ref 70–99)
Potassium: 4.7 mEq/L (ref 3.5–5.1)
Sodium: 137 mEq/L (ref 135–145)
TOTAL PROTEIN: 8 g/dL (ref 6.0–8.3)

## 2017-07-01 LAB — LIPID PANEL
CHOLESTEROL: 244 mg/dL — AB (ref 0–200)
HDL: 34.2 mg/dL — ABNORMAL LOW (ref >39.00)
LDL CALC: 179 mg/dL — AB (ref 0–99)
NonHDL: 209.32
TRIGLYCERIDES: 152 mg/dL — AB (ref 0.0–149.0)
Total CHOL/HDL Ratio: 7
VLDL: 30.4 mg/dL (ref 0.0–40.0)

## 2017-07-01 NOTE — Patient Instructions (Addendum)
Please continue: - Metformin 1000 mg with dinner - Lantus/Tresiba U200 64 units at bedtime - Invokana 100 mg in am, before b'fast  Please stop at any [place] site for labs.  Please return in 1.5 months with your sugar log.

## 2017-07-01 NOTE — Progress Notes (Signed)
Patient ID: Jerry Eaton, male   DOB: [birth year], 46 y.o.   MRN: [id number]   HPI: Jerry Eaton is a 46 y.o.-year-old male, returning for follow-up for DM2, dx in ~2013, insulin-dependent since 2016-2017, uncontrolled, with complications (PN). Last visit 2 months ago.  He started a [employer]estyle [employer] CGM >> started 1 week ago >> likes it.  Last hemoglobin A1c was: Lab Results  Component Value Date   HGBA1C 8.8 05/10/2017   HGBA1C 9.2 (H) 02/27/2017   HGBA1C 9.0 (H) 07/06/2016   Pt was on a regimen of: - Metformin 500 mg 1x a day, with meals (Rx'ed BID, forgets second dose) - Lantus 65 units at bedtime  Tried JanuMet >> transaminitis.  At last visit, we changed to: - Metformin 1000 mg with dinner - Tresiba U200 64 units at bedtime - Invokana 100 mg in am, before b'fast  Pt checks his sugars several times a day  - CGM download reviewed: - am: 126, 160-180, 220 >> 94, 120-140s, 160 - 2h after b'fast: n/c >> 100-200 - before lunch: n/c >> 110-180 (close to b'fast) - 2h after lunch: n/c >> 170-210 - before dinner: n/c >> 90-190 - 2h after dinner: n/c >> 95-200 - bedtime: n/c - nighttime: n/c No lows. Lowest sugar was 126 >> 94; he has hypoglycemia awareness at 70.  Highest sugar was 200s >> 200 after a meal.  Glucometer: OneTouch  [employer] IQ  Pt's meals are: - Breakfast: bisquits - Lunch: sandwich, burgers (may skip) - Dinner: burgers, chicken, steak - Snacks: 0-1  He is traveling a lot for work.  - No CKD, last BUN/creatinine:  Lab Results  Component Value Date   BUN 16 07/06/2016   BUN 11 04/16/2016   CREATININE 0.84 07/06/2016   CREATININE 0.78 04/16/2016   Lab Results  Component Value Date   MICRALBCREAT 4.1 02/27/2017   MICRALBCREAT 0.6 09/19/2015   MICRALBCREAT 0.7 07/26/2014   On lisinopril 2.5 mg daily - started 04/2017.  - He has HL. Last set of lipids: Lab Results  Component Value Date   CHOL 200 07/06/2016   HDL 31.00 (L) 07/06/2016   LDLCALC  148 (H) 07/06/2016   LDLDIRECT 163.5 05/16/2012   TRIG 106.0 07/06/2016   CHOLHDL 6 07/06/2016  Not on a statin >> Tried Crestor and Lipitor but developed joint pain and mm pain. He has been on CoQ10 in the past. -  last eye exam was in 09/2016 >> No DR - he has numbness and tingling in his feet. Was on gabapentin.   He travels to the [place] once a year to meet with his fiance. He just returned this past weekend.  ROS: Constitutional: no weight gain/no weight loss, no fatigue, no subjective hyperthermia, no subjective hypothermia, + increased urination Eyes: no blurry vision, no xerophthalmia ENT: no sore throat, no nodules palpated in throat, no dysphagia, no odynophagia, no hoarseness Cardiovascular: no CP/no SOB/no palpitations/no leg swelling Respiratory: no cough/no SOB/no wheezing Gastrointestinal: no N/no V/no D/no C/no acid reflux Musculoskeletal: no muscle aches/no joint aches Skin: no rashes, no hair loss Neurological: no tremors/+ numbness/+ tingling/no dizziness  I reviewed pt's medications, allergies, PMH, social hx, family hx, and changes were documented in the history of present illness. Otherwise, unchanged from my initial visit note.  Past Medical History:  Diagnosis Date  . Carpal tunnel syndrome   . Diabetes mellitus   . Family history of polyps in the colon   . Fatty liver   . GERD (  gastroesophageal reflux disease)   . History of chicken pox   . Hyperlipidemia    no meds now 05-28-16  . Obesity   . Sleep apnea    not using CPAP at this time 05-28-16   Past Surgical History:  Procedure Laterality Date  . COLONOSCOPY     2012, 2017  . LUMBAR DISC SURGERY     L4/L5  . PILONIDAL CYST EXCISION     2001   Social History   Social History  . Marital status: Single    Spouse name: N/A  . Number of children: 0   Social History Main Topics  . Smoking status: Current Every Day Smoker    Packs/day: 1.00    Years: 20.00    Types: Cigarettes  .  Smokeless tobacco: Never Used  . Alcohol use Yes     Comment: 10-15 per week, usually on the weekends  . 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 benzonatate (TESSALON) 200 MG capsule Take 1 capsule (200 mg total) by mouth 3 (three) times daily as needed. (Patient not taking: Reported on 05/10/2017) 30 capsule 0  . canagliflozin (INVOKANA) 100 MG TABS tablet Take 1 tablet (100 mg total) by mouth daily before breakfast. 30 tablet 5  . Continuous Blood Gluc Receiver (FREESTYLE LIBRE READER) DEVI 1 Device by Does not apply route 3 (three) times daily. 1 Device 1  . Continuous Blood Gluc Sensor (FREESTYLE LIBRE SENSOR SYSTEM) MISC 1 Device by Does not apply route every 30 (thirty) days. 3 each 11  . glucose blood test strip Check sugar before and 2 hours after meals. Okay to check >3 times per day- insulin treated, uncontrolled DM2. 100 each 12  . HYDROcodone-homatropine (HYCODAN) 5-1.5 MG/5ML syrup Take 5 mLs by mouth every 8 (eight) hours as needed for cough. (Patient not taking: Reported on 05/10/2017) 120 mL 0  . ibuprofen (ADVIL,MOTRIN) 200 MG tablet Take 400-600 mg by mouth 3 (three) times daily as needed. With food.      . Insulin Degludec (TRESIBA FLEXTOUCH) 200 UNIT/ML SOPN Inject 64 Units into the skin daily. 3 pen 3  . Insulin Pen Needle (B-D UF III MINI PEN NEEDLES) 31G X 5 MM MISC USE DAILY WITH INSULIN PEN, DX DIABETES 100 each 3  . lisinopril (ZESTRIL) 2.5 MG tablet Take 1 tablet (2.5 mg total) by mouth daily. 90 tablet 3  . Melatonin 5 MG TABS Take 1 tablet by mouth at bedtime and may repeat dose one time if needed.    . metFORMIN (GLUCOPHAGE) 1000 MG tablet Take 1 tablet (1,000 mg total) by mouth 2 (two) times daily with a meal. 180 tablet 3  . Multiple Vitamins-Minerals (MULTIVITAMIN ADULTS PO) Take by  mouth.    . ranitidine (ZANTAC) 150 MG tablet Take 150 mg by mouth as needed.       Current Facility-Administered Medications on File Prior to Visit  Medication Dose Route Frequency Provider Last Rate Last Dose  . 0.9 %  sodium chloride infusion  500 mL Intravenous Continuous Irene Shipper, MD       Allergies  Allergen Reactions  . Other Anaphylaxis    Bee stings  . Chantix [Varenicline Tartrate]     irritable  . Crestor [Rosuvastatin Calcium]     myalgia  .  Januvia [Sitagliptin [place] in LFTs. Occurred with janumet, but prev tolerated metformin alone  . Lipitor [Atorvastatin Calcium]     myalgia  . Lidocaine Rash    Questionable   Family History  Problem Relation Age of Onset  . Diabetes Mother   . Diabetes Father   . Prostate cancer Father        dx'd in his early 73s  . Hyperlipidemia Brother   . Colon cancer Paternal Uncle        dx'd near age 46  . Diabetes Maternal Uncle   . Esophageal cancer Neg Hx   . Stomach cancer Neg Hx   . Rectal cancer Neg Hx   Pt has FH of DM in Mother, father, M uncle.   PE: BP (!) 144/90 (BP Location: Left Arm, Patient Position: Sitting)   Pulse (!) 106   Wt (!) 305 lb (138.3 kg)   SpO2 98%   BMI 41.37 kg/m  Wt Readings from Last 3 Encounters:  07/01/17 (!) 305 lb (138.3 kg)  05/10/17 (!) 310 lb (140.6 kg)  02/27/17 (!) 308 lb 4 oz (139.8 kg)   Constitutional: obese, in NAD Eyes: PERRLA, EOMI, no exophthalmos ENT: moist mucous membranes, no thyromegaly, no cervical lymphadenopathy Cardiovascular: tachycardia, RR, No MRG Respiratory: CTA B Gastrointestinal: abdomen soft, NT, ND, BS+ Musculoskeletal: no deformities, strength intact in all 4 Skin: moist, warm, no rashes Neurological: no tremor with outstretched hands, DTR normal in all 4  ASSESSMENT: 1. DM2, insulin-dependent, uncontrolled, with complications - PN  2. HL  PLAN:  1. Patient with long-standing, Uncontrolled diabetes, on oral antidiabetic regimen  and basal insulin, with improved sugars at this visit, especially after he started the [place] CGM. I encouraged him to continue this. Last HbA1c was slightly better, at 8.8%, and it is not yet time to check another one today. However, his average glucose from the CGM is 152, extrapolated to an HbA1c slightly lower than 7%, which is a great improvement. 28% his values are above the normal range, 69% in the normal range and 3% below 70, per download of his CGM. His coefficient of variation is 30.9% while the standard deviation is 46.9%. - He did not switch to [place] yet but is finishing his Lantus stock.  - We'll not change his regimen for now - I suggested to:  Patient Instructions  Please continue: - Metformin 1000 mg with dinner - Lantus/Tresiba U200 64 units at bedtime - Invokana 100 mg in am, before b'fast  Please stop at any [place] site for labs.  Please return in 1.5 months with your sugar log.   - continue checking sugars at different times of the day - advised for yearly eye exams >> he is UTD - Return to clinic in 1.5 mo with sugar log   2. HL - Reviewed latest lipid panel. - His LDL was high, and am hoping this will improve with improvement of his diabetes.  - We will check a lipid panel today (fasting)   Component     Latest Ref Rng & Units 07/06/2016 07/01/2017  Sodium     135 - 145 mEq/L 138 137  Potassium     3.5 - 5.1 mEq/L 4.3 4.7  Chloride     96 - 112 mEq/L 103 102  CO2     19 - 32 mEq/L 28 23  Glucose     70 - 99 mg/dL 156 (H) 153 (H)  BUN  6 - 23 mg/dL 16 12  Creatinine     0.40 - 1.50 mg/dL 0.84 0.79  Total Bilirubin     0.2 - 1.2 mg/dL 0.4 0.4  Alkaline Phosphatase     39 - 117 U/L 62 56  AST     0 - 37 U/L 20 24  ALT     0 - 53 U/L 24 25  Total Protein     6.0 - 8.3 g/dL 7.4 8.0  Albumin     3.5 - 5.2 g/dL 3.9 4.4  Calcium     8.4 - 10.5 mg/dL 9.3 10.0  GFR     >60.00 mL/min 106.13 113.39  Cholesterol     0 - 200 mg/dL 200 244 (H)   Triglycerides     0.0 - 149.0 mg/dL 106.0 152.0 (H)  HDL Cholesterol     >39.00 mg/dL 31.00 (L) 34.20 (L)  VLDL     0.0 - 40.0 mg/dL 21.2 30.4  LDL (calc)     0 - 99 mg/dL 148 (H) 179 (H)  Total CHOL/HDL Ratio      6 7  NonHDL      168.99 209.32   Normal CMP (except high Glu), but worse Lipids off the statins. Will d/w if he wants to retry a statin. If not, will likely need PCSK9 inhibitors.   Philemon Kingdom, MD PhD [facility]

## 2017-07-02 ENCOUNTER — Telehealth: Payer: Self-pay

## 2017-07-02 ENCOUNTER — Other Ambulatory Visit: Payer: Self-pay | Admitting: Internal Medicine

## 2017-07-02 MED ORDER — FLUVASTATIN SODIUM ER 80 MG PO TB24: 80.0000 mg | ORAL_TABLET | Freq: Every day | ORAL | 5 refills | Status: DC

## 2017-07-02 NOTE — Telephone Encounter (Signed)
-----   Message from Philemon Kingdom, MD sent at 07/02/2017 12:50 PM EDT ----- Almyra Free, can you please call pt: Normal kidney, liver tests, high Glu, and worse Lipids off the statins. Would he consider restarting a statin - one that is milder on the muscles?

## 2017-07-02 NOTE — Telephone Encounter (Signed)
Called patient and gave lab results. Patient had no questions or concerns.  

## 2017-08-13 ENCOUNTER — Ambulatory Visit: Payer: 59 | Admitting: Internal Medicine

## 2017-08-20 ENCOUNTER — Encounter: Payer: Self-pay | Admitting: Internal Medicine

## 2017-08-20 ENCOUNTER — Ambulatory Visit (INDEPENDENT_AMBULATORY_CARE_PROVIDER_SITE_OTHER): Payer: 59 | Admitting: Internal Medicine

## 2017-08-20 VITALS — BP 132/84 | HR 84 | Wt 301.0 lb

## 2017-08-20 DIAGNOSIS — E7849 Other hyperlipidemia: Secondary | ICD-10-CM | POA: Diagnosis not present

## 2017-08-20 DIAGNOSIS — E1140 Type 2 diabetes mellitus with diabetic neuropathy, unspecified: Secondary | ICD-10-CM

## 2017-08-20 DIAGNOSIS — Z794 Long term (current) use of insulin: Secondary | ICD-10-CM | POA: Diagnosis not present

## 2017-08-20 LAB — POCT GLYCOSYLATED HEMOGLOBIN (HGB A1C): Hemoglobin A1C: 7

## 2017-08-20 MED ORDER — INSULIN PEN NEEDLE 32G X 4 MM MISC: 3 refills | Status: DC

## 2017-08-20 NOTE — Progress Notes (Signed)
Patient ID: Jerry Eaton, male   DOB: 1971-08-31, 46 y.o.   MRN: [id number]   HPI: Jerry Eaton is a 46 y.o.-year-old male, returning for follow-up for DM2, dx in ~2013, insulin-dependent since 2016-2017, uncontrolled, with complications (PN). Last visit 1.5 mo ago.  He lost 9 lbs in last 3 mo (stress).  Last hemoglobin A1c was: Lab Results  Component Value Date   HGBA1C 8.8 05/10/2017   HGBA1C 9.2 (H) 02/27/2017   HGBA1C 9.0 (H) 07/06/2016   Pt was on a regimen of: - Metformin 500 mg 1x a day, with meals (Rx'ed BID, forgets second dose) - Lantus 65 units at bedtime  Tried JanuMet >> transaminitis.  Now on: - Metformin 1000 mg with dinner - Tresiba U200 64 units at bedtime - Invokana 100 mg in am, before b'fast  Pt has a CGM: CGM parameters: - Average from CGM: 165+/-32.6  Time in range:  - very low (40-50): 0% - low (50-70): 0% - normal range (70-180): 68% - high sugars (180-250): 32% - very high sugars (250-400): 0%  - am: 126, 160-180, 220 >> 94, 120-140s, 160 >> 100-140, 165 - 2h after b'fast: n/c >> 100-200 >> 190 x1 - before lunch: n/c >> 110-180 (close to b'fast) >> 150 - 2h after lunch: n/c >> 170-210 >> 180-202 - before dinner: n/c >> 90-190 >> 94, 111-153 - 2h after dinner: n/c >> 95-200 >> 100, 142, 180 - bedtime: n/c - nighttime: n/c Lowest sugar was 126 >> 94 >> 94; he has hypoglycemia awareness at 70.  Highest sugar was 200s >> 200 after a meal >> 235.  Glucometer: [employer] IQ; now also on [employer] CGM  Pt's meals are: - Breakfast: bisquits - Lunch: sandwich, burgers (may skip) - Dinner: burgers, chicken, steak - Snacks: 0-1  He is traveling a lot for work.  - No CKD, last BUN/creatinine:  Lab Results  Component Value Date   BUN 12 07/01/2017   BUN 16 07/06/2016   CREATININE 0.79 07/01/2017   CREATININE 0.84 07/06/2016   Lab Results  Component Value Date   MICRALBCREAT 4.1 02/27/2017   MICRALBCREAT 0.6 09/19/2015    MICRALBCREAT 0.7 07/26/2014   On lisinopril 2.5.[name]  - + HL. Last set of lipids: Lab Results  Component Value Date   CHOL 244 (H) 07/01/2017   HDL 34.20 (L) 07/01/2017   LDLCALC 179 (H) 07/01/2017   LDLDIRECT 163.5 05/16/2012   TRIG 152.0 (H) 07/01/2017   CHOLHDL 7 07/01/2017  Not on a statin >> Tried Crestor and Lipitor >> jt and mm pain. Tried CoQ10 with these but not helping.  He could start Lescol XL >> no SEs. -  last eye exam was in 09/2016 >> No DR - The numbness and tingling in his feet is better. Was on gabapentin.   He travels to the [place] once a year to meet with his fiance. He just returned this past weekend.  ROS: Constitutional: no weight gain/no weight loss, no fatigue, no subjective hyperthermia, no subjective hypothermia Eyes: no blurry vision, no xerophthalmia ENT: no sore throat, no nodules palpated in throat, no dysphagia, no odynophagia, no hoarseness Cardiovascular: no CP/no SOB/no palpitations/no leg swelling Respiratory: no cough/no SOB/no wheezing Gastrointestinal: no N/no V/no D/no C/no acid reflux Musculoskeletal: no muscle aches/no joint aches Skin: no rashes, no hair loss Neurological: no tremors/numbness/ tingling/no dizziness  I reviewed pt's medications, allergies, PMH, social hx, family hx, and changes were documented in the history of present  illness. Otherwise, unchanged from my initial visit note.  Past Medical History:  Diagnosis Date  . Carpal tunnel syndrome   . Diabetes mellitus   . Family history of polyps in the colon   . Fatty liver   . GERD (gastroesophageal reflux disease)   . History of chicken pox   . Hyperlipidemia    no meds now 05-28-16  . Obesity   . Sleep apnea    not using CPAP at this time 05-28-16   Past Surgical History:  Procedure Laterality Date  . COLONOSCOPY     2012, 2017  . LUMBAR DISC SURGERY     L4/L5  . PILONIDAL CYST EXCISION     2001   Social History   Social History  . Marital status: Single     Spouse name: N/A  . Number of children: 0   Social History Main Topics  . Smoking status: Current Every Day Smoker    Packs/day: 1.00    Years: 20.00    Types: Cigarettes  . Smokeless tobacco: Never Used  . Alcohol use Yes     Comment: 10-15 per week, usually on the weekends  . 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 benzonatate (TESSALON) 200 MG capsule Take 1 capsule (200 mg total) by mouth 3 (three) times daily as needed. (Patient not taking: Reported on 05/10/2017) 30 capsule 0  . canagliflozin (INVOKANA) 100 MG TABS tablet Take 1 tablet (100 mg total) by mouth daily before breakfast. 30 tablet 5  . Continuous Blood Gluc Receiver (FREESTYLE LIBRE READER) DEVI 1 Device by Does not apply route 3 (three) times daily. 1 Device 1  . Continuous Blood Gluc Sensor (FREESTYLE LIBRE SENSOR SYSTEM) MISC 1 Device by Does not apply route every 30 (thirty) days. 3 each 11  . fluvastatin XL (LESCOL XL) 80 MG 24 hr tablet Take 1 tablet (80 mg total) by mouth daily. 30 tablet 5  . glucose blood test strip Check sugar before and 2 hours after meals. Okay to check >3 times per day- insulin treated, uncontrolled DM2. 100 each 12  . HYDROcodone-homatropine (HYCODAN) 5-1.5 MG/5ML syrup Take 5 mLs by mouth every 8 (eight) hours as needed for cough. (Patient not taking: Reported on 05/10/2017) 120 mL 0  . ibuprofen (ADVIL,MOTRIN) 200 MG tablet Take 400-600 mg by mouth 3 (three) times daily as needed. With food.      . Insulin Degludec (TRESIBA FLEXTOUCH) 200 UNIT/ML SOPN Inject 64 Units into the skin daily. (Patient not taking: Reported on 07/01/2017) 3 pen 3  . Insulin Pen Needle (B-D UF III MINI PEN NEEDLES) 31G X 5 MM MISC USE DAILY WITH INSULIN PEN, DX DIABETES 100 each 3  . lisinopril (ZESTRIL) 2.5 MG tablet Take 1 tablet  (2.5 mg total) by mouth daily. 90 tablet 3  . Melatonin 5 MG TABS Take 1 tablet by mouth at bedtime and may repeat dose one time if needed.    . metFORMIN (GLUCOPHAGE) 1000 MG tablet Take 1 tablet (1,000 mg total) by mouth 2 (two) times daily with a meal. (Patient taking differently: Take 1,000 mg by mouth 2 (two) times daily with a meal. ) 180 tablet 3  . Multiple Vitamins-Minerals (MULTIVITAMIN ADULTS PO) Take by mouth.    . ranitidine (ZANTAC) 150 MG  tablet Take 150 mg by mouth as needed.       Current Facility-Administered Medications on File Prior to Visit  Medication Dose Route Frequency Provider Last Rate Last Dose  . 0.9 %  sodium chloride infusion  500 mL Intravenous Continuous Irene Shipper, MD       Allergies  Allergen Reactions  . Other Anaphylaxis    Bee stings  . Chantix [Varenicline Tartrate]     irritable  . Crestor [Rosuvastatin Calcium]     myalgia  . Januvia [Sitagliptin Twin in LFTs. Occurred with janumet, but prev tolerated metformin alone  . Lipitor [Atorvastatin Calcium]     myalgia  . Lidocaine Rash    Questionable   Family History  Problem Relation Age of Onset  . Diabetes Mother   . Diabetes Father   . Prostate cancer Father        dx'd in his early 63s  . Hyperlipidemia Brother   . Colon cancer Paternal Uncle        dx'd near age 30  . Diabetes Maternal Uncle   . Esophageal cancer Neg Hx   . Stomach cancer Neg Hx   . Rectal cancer Neg Hx   Pt has FH of DM in Mother, father, M uncle.   PE: BP 132/84 (BP Location: Left Arm, Patient Position: Sitting)   Pulse 84   Wt (!) 301 lb (136.5 kg)   SpO2 97%   BMI 40.82 kg/m  Wt Readings from Last 3 Encounters:  08/20/17 (!) 301 lb (136.5 kg)  07/01/17 (!) 305 lb (138.3 kg)  05/10/17 (!) 310 lb (140.6 kg)   Constitutional: obese, in NAD Eyes: PERRLA, EOMI, no exophthalmos ENT: moist mucous membranes, no thyromegaly, no cervical lymphadenopathy Cardiovascular: RRR, No  MRG Respiratory: CTA B Gastrointestinal: abdomen soft, NT, ND, BS+ Musculoskeletal: no deformities, strength intact in all 4 Skin: moist, warm, no rashes Neurological: no tremor with outstretched hands, DTR normal in all 4  ASSESSMENT: 1. DM2, insulin-dependent, uncontrolled, with complications - PN  2. HL  PLAN:  1. Patient with long-standing, uncontrolled diabetes, with better control in the last few months, on metformin, Lantus, and iNVOKANA. Since last visit, approximately 3 days ago, he switched to [place] per insurance preference.  - His sugars have been a little higher in the last few days as he mentions that he does not sleep very well with stress from his new job. Otherwise, they are close to goal.  - At this visit, since he still may have some higher blood sugars in fluctuations in his CBGs, I suggested to increase metformin to twice a day. At next visit, we can increase iNVOKANA if needed.  - I suggested to:  Patient Instructions  Please continue: - Tresiba U200 64 units daily - Invokna 100 mg daily in am  Please increase  - Metformin to 1000 mg 2x a day with meals.  Please return in 3 months with your sugar log.   - today, HbA1c is 7% (better!) - continue checking sugars at different times of the day - check 1x a day, rotating checks - advised for yearly eye exams >> he is UTD - he had flu shot this season - Return to clinic in 3 mo with sugar log   2. HL - worse lipids on latest Lipid panel at last visit - I suggested to try Lescol XL which is associated with less mm pain >> he started it and does not have  any sxs - will check Lipids at next visit  Jerry Kingdom, MD PhD [facility]

## 2017-08-20 NOTE — Addendum Note (Signed)
Addended by: Caprice Beaver T on: 08/20/2017 08:52 AM   Modules accepted: Orders

## 2017-08-20 NOTE — Patient Instructions (Addendum)
Please continue: - Tresiba U200 64 units daily - Invokna 100 mg daily in am  Please increase  - Metformin to 1000 mg 2x a day with meals.  Please return in 3 months with your sugar log.

## 2017-09-23 DIAGNOSIS — E119 Type 2 diabetes mellitus without complications: Secondary | ICD-10-CM | POA: Diagnosis not present

## 2017-09-23 LAB — HM DIABETES EYE EXAM

## 2017-10-08 ENCOUNTER — Encounter: Payer: Self-pay | Admitting: Family Medicine

## 2017-11-16 DIAGNOSIS — H66009 Acute suppurative otitis media without spontaneous rupture of ear drum, unspecified ear: Secondary | ICD-10-CM | POA: Diagnosis not present

## 2017-11-25 ENCOUNTER — Encounter: Payer: Self-pay | Admitting: Internal Medicine

## 2017-11-25 ENCOUNTER — Ambulatory Visit: Payer: 59 | Admitting: Internal Medicine

## 2017-11-25 VITALS — BP 130/82 | HR 92 | Ht 72.0 in | Wt 313.8 lb

## 2017-11-25 DIAGNOSIS — E785 Hyperlipidemia, unspecified: Secondary | ICD-10-CM

## 2017-11-25 DIAGNOSIS — Z794 Long term (current) use of insulin: Secondary | ICD-10-CM

## 2017-11-25 DIAGNOSIS — E1140 Type 2 diabetes mellitus with diabetic neuropathy, unspecified: Secondary | ICD-10-CM | POA: Diagnosis not present

## 2017-11-25 LAB — POCT GLYCOSYLATED HEMOGLOBIN (HGB A1C): HEMOGLOBIN A1C: 7.8

## 2017-11-25 NOTE — Patient Instructions (Addendum)
Please continue: - Tresiba U200 64 units daily - Metformin 1000 mg 2x a day with meals.  Restart: - Invokana 100 mg daily in am  Please return in 3 months with your sugar log.

## 2017-11-25 NOTE — Progress Notes (Signed)
Patient ID: Jerry Eaton, male   DOB: 1971-02-26, 47 y.o.   MRN: [id number]   HPI: Jerry Eaton is a 47 y.o.-year-old male, returning for follow-up for DM2, dx in ~2013, insulin-dependent since 2016-2017, uncontrolled, with complications (PN). Last visit 3 months ago.  Last hemoglobin A1c was: Lab Results  Component Value Date   HGBA1C 7.0 08/20/2017   HGBA1C 8.8 05/10/2017   HGBA1C 9.2 (H) 02/27/2017   Pt was on a regimen of: - Metformin 500 mg 1x a day, with meals (Rx'ed BID, forgets second dose) - Lantus 65 units at bedtime  Tried JanuMet >> transaminitis.  Now on: - Metformin 1000 mg with dinner >> 2x a day - Tresiba U200 64 units at bedtime - Invokana 100 mg in am, before b'fast >> stopped in last 2 weeks b/c price.  Freestyle Libre CGM - forgot receiver.  - am:   94, 120-140s, 160 >> 100-140, 165 >> 98-140 - 2h after b'fast: n/c >> 100-200 >> 190 x1 >> n/c - before lunch: 110-180 >> 150 >> n/c - 2h after lunch: 170-210 >> 180-202 >> 160-180 - before dinner: 90-190 >> 94, 111-153 >> n/c - 2h after dinner: 95-200 >> 100, 142, 180 >> 180-200 - bedtime: n/c - nighttime: n/c He has hypoglycemia awareness in the 70s.  Glucometer: [employer] IQ; now also on [employer] CGM  Pt's meals are: - Breakfast: bisquits - Lunch: sandwich, burgers (may skip) - Dinner: burgers, chicken, steak - Snacks: 0-1  He is traveling a lot for work.  -No CKD, last BUN/creatinine:  Lab Results  Component Value Date   BUN 12 07/01/2017   BUN 16 07/06/2016   CREATININE 0.79 07/01/2017   CREATININE 0.84 07/06/2016   Lab Results  Component Value Date   MICRALBCREAT 4.1 02/27/2017   MICRALBCREAT 0.6 09/19/2015   MICRALBCREAT 0.7 07/26/2014   On li lisinopril 2.5.  - + HL. Last set of lipids: Lab Results  Component Value Date   CHOL 244 (H) 07/01/2017   HDL 34.20 (L) 07/01/2017   LDLCALC 179 (H) 07/01/2017   LDLDIRECT 163.5 05/16/2012   TRIG 152.0 (H) 07/01/2017   CHOLHDL 7 07/01/2017  Tried Crestor and Lipitor >> joint and muscle pain.   Tried CoQ10 with these but not helping.  We started Lescol XL and he has no side effects from this. -  last eye exam was in 09/2017: No DR - better numbness and tingling in feet  He travels to the [place] once a year to meet with his fiance. He just returned this past weekend.  ROS: Constitutional: no weight gain/no weight loss, no fatigue, no subjective hyperthermia, no subjective hypothermia Eyes: no blurry vision, no xerophthalmia ENT: no sore throat, no nodules palpated in throat, no dysphagia, no odynophagia, no hoarseness Cardiovascular: no CP/no SOB/no palpitations/no leg swelling Respiratory: no cough/no SOB/no wheezing Gastrointestinal: no N/no V/no D/no C/no acid reflux Musculoskeletal: no muscle aches/no joint aches Skin: no rashes, no hair loss Neurological: no tremors/no numbness/no tingling/no dizziness  I reviewed pt's medications, allergies, PMH, social hx, family hx, and changes were documented in the history of present illness. Otherwise, unchanged from my initial visit note.  Past Medical History:  Diagnosis Date  . Carpal tunnel syndrome   . Diabetes mellitus   . Family history of polyps in the colon   . Fatty liver   . GERD (gastroesophageal reflux disease)   . History of chicken pox   . Hyperlipidemia  no meds now 05-28-16  . Obesity   . Sleep apnea    not using CPAP at this time 05-28-16   Past Surgical History:  Procedure Laterality Date  . COLONOSCOPY     2012, 2017  . LUMBAR DISC SURGERY     L4/L5  . PILONIDAL CYST EXCISION     2001   Social History   Social History  . Marital status: Single    Spouse name: N/A  . Number of children: 0   Social History Main Topics  . Smoking status: Current Every Day Smoker    Packs/day: 1.00    Years: 20.00    Types: Cigarettes  . Smokeless tobacco: Never Used  . Alcohol use Yes     Comment: 10-15 per week, usually on the  weekends  . 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 benzonatate (TESSALON) 200 MG capsule Take 1 capsule (200 mg total) by mouth 3 (three) times daily as needed. (Patient not taking: Reported on 05/10/2017) 30 capsule 0  . canagliflozin (INVOKANA) 100 MG TABS tablet Take 1 tablet (100 mg total) by mouth daily before breakfast. 30 tablet 5  . Continuous Blood Gluc Receiver (FREESTYLE LIBRE READER) DEVI 1 Device by Does not apply route 3 (three) times daily. 1 Device 1  . Continuous Blood Gluc Sensor (FREESTYLE LIBRE SENSOR SYSTEM) MISC 1 Device by Does not apply route every 30 (thirty) days. 3 each 11  . fluvastatin XL (LESCOL XL) 80 MG 24 hr tablet Take 1 tablet (80 mg total) by mouth daily. 30 tablet 5  . glucose blood test strip Check sugar before and 2 hours after meals. Okay to check >3 times per day- insulin treated, uncontrolled DM2. 100 each 12  . HYDROcodone-homatropine (HYCODAN) 5-1.5 MG/5ML syrup Take 5 mLs by mouth every 8 (eight) hours as needed for cough. (Patient not taking: Reported on 05/10/2017) 120 mL 0  . ibuprofen (ADVIL,MOTRIN) 200 MG tablet Take 400-600 mg by mouth 3 (three) times daily as needed. With food.      . Insulin Degludec (TRESIBA FLEXTOUCH) 200 UNIT/ML SOPN Inject 64 Units into the skin daily. 3 pen 3  . Insulin Pen Needle (B-D UF III MINI PEN NEEDLES) 31G X 5 MM MISC USE DAILY WITH INSULIN PEN, DX DIABETES 100 each 3  . Insulin Pen Needle (NOVOFINE PLUS) 32G X 4 MM MISC Inject once a day 100 each 3  . lisinopril (ZESTRIL) 2.5 MG tablet Take 1 tablet (2.5 mg total) by mouth daily. 90 tablet 3  . Melatonin 5 MG TABS Take 1 tablet by mouth at bedtime and may repeat dose one time if needed.    . metFORMIN (GLUCOPHAGE) 1000 MG tablet Take 1 tablet (1,000 mg total) by mouth 2 (two) times daily  with a meal. (Patient taking differently: Take 1,000 mg by mouth 2 (two) times daily with a meal. ) 180 tablet 3  . Multiple Vitamins-Minerals (MULTIVITAMIN ADULTS PO) Take by mouth.    . ranitidine (ZANTAC) 150 MG tablet Take 150 mg by mouth as needed.       Current Facility-Administered Medications on File Prior to Visit  Medication Dose Route Frequency Provider Last Rate Last Dose  . 0.9 %  sodium chloride infusion  500 mL Intravenous Continuous Irene Shipper,  MD       Allergies  Allergen Reactions  . Other Anaphylaxis    Bee stings  . Chantix [Varenicline Tartrate]     irritable  . Crestor [Rosuvastatin Calcium]     myalgia  . Januvia [Sitagliptin [place] in LFTs. Occurred with janumet, but prev tolerated metformin alone  . Lipitor [Atorvastatin Calcium]     myalgia  . Lidocaine Rash    Questionable   Family History  Problem Relation Age of Onset  . Diabetes Mother   . Diabetes Father   . Prostate cancer Father        dx'd in his early 56s  . Hyperlipidemia Brother   . Colon cancer Paternal Uncle        dx'd near age 40  . Diabetes Maternal Uncle   . Esophageal cancer Neg Hx   . Stomach cancer Neg Hx   . Rectal cancer Neg Hx   Pt has FH of DM in Mother, father, M uncle.   PE: BP 130/82   Pulse 92   Ht 6' (1.829 m)   Wt (!) 313 lb 12.8 oz (142.3 kg)   SpO2 98%   BMI 42.56 kg/m  Wt Readings from Last 3 Encounters:  11/25/17 (!) 313 lb 12.8 oz (142.3 kg)  08/20/17 (!) 301 lb (136.5 kg)  07/01/17 (!) 305 lb (138.3 kg)   Constitutional: overweight, in NAD Eyes: PERRLA, EOMI, no exophthalmos ENT: moist mucous membranes, no thyromegaly, no cervical lymphadenopathy Cardiovascular: tachycardia, RR, No MRG Respiratory: CTA B Gastrointestinal: abdomen soft, NT, ND, BS+ Musculoskeletal: no deformities, strength intact in all 4 Skin: moist, warm, no rashes Neurological: no tremor with outstretched hands, DTR normal in all 4  ASSESSMENT: 1. DM2,  insulin-dependent, uncontrolled, with complications - PN  2. HL  PLAN:  1. Patient with long-standing, uncontrolled, diabetes, on metformin, Invokana, and basal insulin.  At last visit, we increased metformin as his sugars have been higher due to stress with a new job.  - sugars higher after meals off Invokana >> will restart but given coupon card to help with cost - we can increase Invokana, if needed, at next visit - I suggested to:  Patient Instructions  Please continue: - Tresiba U200 64 units daily - Metformin 1000 mg 2x a day with meals.  Please resume:  - Invokana 100 mg daily in am  Please return in 3 months with your sugar log.   - today, HbA1c is 7.8% (higher) - continue checking sugars at different times of the day - check 1x a day, rotating checks - advised for yearly eye exams >> he is UTD - also UTD with flu shot - Return to clinic in 3 mo with sugar log    2. HL - Latest lipid panel reviewed: Worse lipids at last check in 06/2017 >> at that time, we started Lescol XL, which is associated with less muscle pain.  He is tolerating this well. - he overate over the Holidays >> gained 12 lbs - We will check another set of lipids at next visit   Philemon Kingdom, MD PhD [facility] Endocrinology

## 2017-11-25 NOTE — Addendum Note (Signed)
Addended by: Drucilla Schmidt on: 11/25/2017 09:00 AM   Modules accepted: Orders

## 2017-12-31 DIAGNOSIS — D225 Melanocytic nevi of trunk: Secondary | ICD-10-CM | POA: Diagnosis not present

## 2017-12-31 DIAGNOSIS — L0212 Furuncle of neck: Secondary | ICD-10-CM | POA: Diagnosis not present

## 2017-12-31 DIAGNOSIS — Z1283 Encounter for screening for malignant neoplasm of skin: Secondary | ICD-10-CM | POA: Diagnosis not present

## 2017-12-31 DIAGNOSIS — D485 Neoplasm of uncertain behavior of skin: Secondary | ICD-10-CM | POA: Diagnosis not present

## 2018-02-10 ENCOUNTER — Other Ambulatory Visit: Payer: Self-pay | Admitting: Internal Medicine

## 2018-02-11 DIAGNOSIS — L988 Other specified disorders of the skin and subcutaneous tissue: Secondary | ICD-10-CM | POA: Diagnosis not present

## 2018-02-11 DIAGNOSIS — D485 Neoplasm of uncertain behavior of skin: Secondary | ICD-10-CM | POA: Diagnosis not present

## 2018-02-23 DIAGNOSIS — J209 Acute bronchitis, unspecified: Secondary | ICD-10-CM | POA: Diagnosis not present

## 2018-02-23 DIAGNOSIS — J301 Allergic rhinitis due to pollen: Secondary | ICD-10-CM | POA: Diagnosis not present

## 2018-02-26 ENCOUNTER — Telehealth: Payer: Self-pay

## 2018-02-26 NOTE — Telephone Encounter (Signed)
Patient was put on prednisone last Sunday for cough and was wondering if he should come off of it because it is causing his blood sugar to be consistently 200 and over please advise- the patient is requesting a call back as soon as possible

## 2018-02-27 NOTE — Telephone Encounter (Signed)
Possibly, if cough is better.

## 2018-03-03 NOTE — Telephone Encounter (Signed)
Called pt. No answer °

## 2018-03-06 ENCOUNTER — Encounter: Payer: Self-pay | Admitting: Family Medicine

## 2018-03-06 ENCOUNTER — Ambulatory Visit: Payer: 59 | Admitting: Family Medicine

## 2018-03-06 VITALS — BP 122/72 | HR 88 | Temp 97.9°F | Ht 72.0 in | Wt 298.2 lb

## 2018-03-06 DIAGNOSIS — R5383 Other fatigue: Secondary | ICD-10-CM

## 2018-03-06 DIAGNOSIS — Z794 Long term (current) use of insulin: Secondary | ICD-10-CM

## 2018-03-06 DIAGNOSIS — E1140 Type 2 diabetes mellitus with diabetic neuropathy, unspecified: Secondary | ICD-10-CM | POA: Diagnosis not present

## 2018-03-06 DIAGNOSIS — R002 Palpitations: Secondary | ICD-10-CM | POA: Diagnosis not present

## 2018-03-06 LAB — CBC WITH DIFFERENTIAL/PLATELET
BASOS ABS: 0.1 10*3/uL (ref 0.0–0.1)
BASOS PCT: 1.1 % (ref 0.0–3.0)
EOS PCT: 0.9 % (ref 0.0–5.0)
Eosinophils Absolute: 0.1 10*3/uL (ref 0.0–0.7)
HEMATOCRIT: 47.9 % (ref 39.0–52.0)
Hemoglobin: 15.9 g/dL (ref 13.0–17.0)
LYMPHS PCT: 18.7 % (ref 12.0–46.0)
Lymphs Abs: 1.7 10*3/uL (ref 0.7–4.0)
MCHC: 33.2 g/dL (ref 30.0–36.0)
MCV: 86.3 fl (ref 78.0–100.0)
Monocytes Absolute: 0.6 10*3/uL (ref 0.1–1.0)
Monocytes Relative: 6.8 % (ref 3.0–12.0)
Neutro Abs: 6.8 10*3/uL (ref 1.4–7.7)
Neutrophils Relative %: 72.5 % (ref 43.0–77.0)
PLATELETS: 284 10*3/uL (ref 150.0–400.0)
RBC: 5.55 Mil/uL (ref 4.22–5.81)
RDW: 14 % (ref 11.5–15.5)
WBC: 9.4 10*3/uL (ref 4.0–10.5)

## 2018-03-06 LAB — COMPREHENSIVE METABOLIC PANEL
ALBUMIN: 4.3 g/dL (ref 3.5–5.2)
ALT: 21 U/L (ref 0–53)
AST: 16 U/L (ref 0–37)
Alkaline Phosphatase: 63 U/L (ref 39–117)
BUN: 15 mg/dL (ref 6–23)
CALCIUM: 10.2 mg/dL (ref 8.4–10.5)
CO2: 29 mEq/L (ref 19–32)
Chloride: 101 mEq/L (ref 96–112)
Creatinine, Ser: 0.76 mg/dL (ref 0.40–1.50)
GFR: 118.2 mL/min (ref >60.00)
Glucose, Bld: 147 mg/dL — ABNORMAL HIGH (ref 70–99)
POTASSIUM: 4.7 meq/L (ref 3.5–5.1)
Sodium: 138 mEq/L (ref 135–145)
Total Bilirubin: 0.4 mg/dL (ref 0.2–1.2)
Total Protein: 7.8 g/dL (ref 6.0–8.3)

## 2018-03-06 LAB — HEMOGLOBIN A1C: Hgb A1c MFr Bld: 7.7 % — ABNORMAL HIGH (ref 4.6–6.5)

## 2018-03-06 LAB — TSH: TSH: 1.22 u[IU]/mL (ref 0.35–4.50)

## 2018-03-06 MED ORDER — METFORMIN HCL 1000 MG PO TABS: 1000.0000 mg | ORAL_TABLET | Freq: Two times a day (BID) | ORAL | Status: DC

## 2018-03-06 NOTE — Patient Instructions (Signed)
Go to the lab on the way out.  We'll contact you with your lab report. Limit caffeine for now.  Drink enough fluids to keep your urine clear or light colored.  Take care.  Glad to see you.

## 2018-03-06 NOTE — Progress Notes (Signed)
He was able to tolerate lidocaine with prev derm procedure.  D/w pt.   Fatigue.  He is working as Freight forwarder with more job stress.  He is getting married in July. He has a lot going on.  D/w pt.  Mood is okay.    He had some occ palpitations but none today.  He has some occ tingling on the scalp.   Sx started about 5 days ago.  Was lightheaded yesterday.  Then last night he drank some coffee and didn't feel well last night.  No CP.  No lip droop.  No speech changes.  No weakness in the legs or arms. No syncope.    He wanted a spot on his back checked.  Removed ~3 weeks ago.  He had 2nd excision, repeat excision was unremarkable.    Sugar has been ~170.  He has endocrine f/u pending.   He doesn't know how much he is snoring.  Prev dx mild OSA.  Not waking gasping for air.  No AM headache.    Meds, vitals, and allergies reviewed.   ROS: Per HPI unless specifically indicated in ROS section   GEN: nad, alert and oriented HEENT: mucous membranes moist NECK: supple w/o LA CV: rrr.  no murmur PULM: ctab, no inc wob ABD: soft, +bs EXT: no edema SKIN: no acute rash but 2x1 healing site on the L side of thorax. Looks to have normal healing.   CN 2-12 wnl B, S/S wnl x4  EKG w/o acute changes.

## 2018-03-09 DIAGNOSIS — R5383 Other fatigue: Secondary | ICD-10-CM | POA: Insufficient documentation

## 2018-03-09 DIAGNOSIS — R002 Palpitations: Secondary | ICD-10-CM | POA: Insufficient documentation

## 2018-03-09 NOTE — Assessment & Plan Note (Signed)
Check routine labs today.  He does have history of mild obstructive sleep apnea.  He is not waking up gasping for air and he does not have morning headaches.  If he has significant fatigue he continues we can send him back for consideration of repeat obstructive sleep apnea testing. >25 minutes spent in face to face time with patient, >50% spent in counselling or coordination of care, discussing palpitations, rationale for treatment, EKG results, etc.

## 2018-03-09 NOTE — Assessment & Plan Note (Signed)
Unclear how much this is related to caffeine.  Reasonable to check routine labs today.  EKG is unremarkable without acute changes.  Taper caffeine in the meantime.  Maintain adequate hydration.  Okay for outpatient follow-up.  He will update me as he goes along.  Unclear how much of the symptoms are related to recent stressors with work and trying to get everything lined up for his wedding.

## 2018-03-09 NOTE — Assessment & Plan Note (Signed)
He has endocrine follow-up pending but since we were going to go ahead and draw labs today we went ahead and got his A1c done.  Discussed with patient.  See notes on labs.  Appreciate endocrine input.

## 2018-03-10 ENCOUNTER — Ambulatory Visit: Payer: 59 | Admitting: Internal Medicine

## 2018-03-10 ENCOUNTER — Encounter: Payer: Self-pay | Admitting: Internal Medicine

## 2018-03-10 VITALS — BP 128/80 | HR 105 | Ht 73.0 in | Wt 304.0 lb

## 2018-03-10 DIAGNOSIS — Z794 Long term (current) use of insulin: Secondary | ICD-10-CM

## 2018-03-10 DIAGNOSIS — E1140 Type 2 diabetes mellitus with diabetic neuropathy, unspecified: Secondary | ICD-10-CM

## 2018-03-10 DIAGNOSIS — E785 Hyperlipidemia, unspecified: Secondary | ICD-10-CM | POA: Diagnosis not present

## 2018-03-10 MED ORDER — INSULIN DEGLUDEC 200 UNIT/ML ~~LOC~~ SOPN: 64.0000 [IU] | PEN_INJECTOR | Freq: Every day | SUBCUTANEOUS | 3 refills | Status: DC

## 2018-03-10 MED ORDER — SEMAGLUTIDE(0.25 OR 0.5MG/DOS) 2 MG/1.5ML ~~LOC~~ SOPN: 0.5000 mg | PEN_INJECTOR | SUBCUTANEOUS | 5 refills | Status: DC

## 2018-03-10 MED ORDER — FREESTYLE LIBRE 14 DAY SENSOR MISC: 1.0000 | 11 refills | Status: DC

## 2018-03-10 MED ORDER — INSULIN PEN NEEDLE 32G X 4 MM MISC: 3 refills | Status: DC

## 2018-03-10 MED ORDER — FREESTYLE LIBRE 14 DAY READER DEVI: 1.0000 | Freq: Once | 1 refills | Status: AC

## 2018-03-10 NOTE — Patient Instructions (Addendum)
Please continue: - Tresiba U200 64 units daily - Metformin 1000 mg 2x a day with meals. - Invokana 100 mg daily in a.m.  Please start Ozempic 0.25 mg weekly for 4 weeks, then increase to 0.5 mg weekly.  Please return in 3 months with your sugar log.   You can use these for the CGM: - Skin tac wipes or solution - IV 3000 membrane - Tegaderm film

## 2018-03-10 NOTE — Progress Notes (Signed)
Patient ID: Jerry Eaton, male   DOB: Aug 19, 1971, 47 y.o.   MRN: [id number]   HPI: Jerry Eaton is a 47 y.o.-year-old male, returning for follow-up for DM2, dx in ~2013, insulin-dependent since 2016-2017, uncontrolled, with complications (PN). Last visit 3.5 months ago.  He was on steroids for steroids for bronchitis - beginning of this month.  Sugars were higher, in the 200s.  Last hemoglobin A1c was: Lab Results  Component Value Date   HGBA1C 7.7 (H) 03/06/2018   HGBA1C 7.8 11/25/2017   HGBA1C 7.0 08/20/2017   Pt was on a regimen of: - Metformin 500 mg 1x a day, with meals (Rx'ed BID, forgets second dose) - Lantus 65 units at bedtime  Tried JanuMet >> transaminitis.  Now on: - Tresiba U200 64 units daily - Metformin 1000 mg 2x a day with meals. - Invokana 100 mg daily in a.m.  Freestyle Libre CGM - forgot receiver.  His sugars are: - am:   94, 120-140s, 160 >> 100-140, 165 >> 98-140 >> 112, 122, 140-200, 242, 257 (steroids) - 2h after b'fast: n/c >> 100-200 >> 190 x1 >> n/c - before lunch: 110-180 >> 150 >> n/c - 2h after lunch: 170-210 >> 180-202 >> 160-180 >> n/c - before dinner: 90-190 >> 94, 111-153 >> n/c - 2h after dinner: 95-200 >> 100, 142, 180 >> 180-200 >> 188-225 - bedtime: n/c - nighttime: n/c He has hypoglycemia awareness in the 70s.  Glucometer: [employer] IQ; now also on freestyle libre CGM  Pt's meals are: - Breakfast: bisquits - Lunch: sandwich, burgers (may skip) - Dinner: burgers, chicken, steak - Snacks: 0-1  He is traveling a lot for work.  -No CKD, last BUN/creatinine:  Lab Results  Component Value Date   BUN 15 03/06/2018   BUN 12 07/01/2017   CREATININE 0.76 03/06/2018   CREATININE 0.79 07/01/2017   Lab Results  Component Value Date   MICRALBCREAT 4.1 02/27/2017   MICRALBCREAT 0.6 09/19/2015   MICRALBCREAT 0.7 07/26/2014   On lisinopril 2.5.  - + HL. Last set of lipids: Lab Results  Component Value Date   CHOL 244  (H) 07/01/2017   HDL 34.20 (L) 07/01/2017   LDLCALC 179 (H) 07/01/2017   LDLDIRECT 163.5 05/16/2012   TRIG 152.0 (H) 07/01/2017   CHOLHDL 7 07/01/2017  Tried Crestor and Lipitor >> joint and muscle pain.   Tried CoQ10 with these but not helping.  We started Lescol XL in 06/2017 and he has no side effects from this. -  last eye exam was in  09/2017: No DR - Improved numbness and tingling in feet  He travels to the [place] once a year to meet with his fiance.   ROS: Constitutional: no weight gain/+ weight loss, no fatigue, no subjective hyperthermia, no subjective hypothermia Eyes: no blurry vision, no xerophthalmia ENT: no sore throat, no nodules palpated in throat, no dysphagia, no odynophagia, no hoarseness Cardiovascular: no CP/no SOB/no palpitations/no leg swelling Respiratory: no cough/no SOB/no wheezing Gastrointestinal: no N/no V/no D/no C/no acid reflux Musculoskeletal: no muscle aches/no joint aches Skin: no rashes, no hair loss Neurological: no tremors/no numbness/no tingling/no dizziness  I reviewed pt's medications, allergies, PMH, social hx, family hx, and changes were documented in the history of present illness. Otherwise, unchanged from my initial visit note.  Past Medical History:  Diagnosis Date  . Carpal tunnel syndrome   . Diabetes mellitus   . Family history of polyps in the colon   . Fatty  liver   . GERD (gastroesophageal reflux disease)   . History of chicken pox   . Hyperlipidemia    no meds now 05-28-16  . Obesity   . Sleep apnea    not using CPAP at this time 05-28-16   Past Surgical History:  Procedure Laterality Date  . COLONOSCOPY     2012, 2017  . LUMBAR DISC SURGERY     L4/L5  . PILONIDAL CYST EXCISION     2001   Social History   Social History  . Marital status: Single    Spouse name: N/A  . Number of children: 0   Social History Main Topics  . Smoking status: Current Every Day Smoker    Packs/day: 1.00    Years: 20.00     Types: Cigarettes  . Smokeless tobacco: Never Used  . Alcohol use Yes     Comment: 10-15 per week, usually on the weekends  . 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 Continuous Blood Gluc Receiver (FREESTYLE LIBRE READER) DEVI 1 Device by Does not apply route 3 (three) times daily. 1 Device 1  . Continuous Blood Gluc Sensor (FREESTYLE LIBRE SENSOR SYSTEM) MISC 1 Device by Does not apply route every 30 (thirty) days. 3 each 11  . fluvastatin XL (LESCOL XL) 80 MG 24 hr tablet Take 1 tablet (80 mg total) by mouth daily. 30 tablet 5  . glucose blood test strip Check sugar before and 2 hours after meals. Okay to check >3 times per day- insulin treated, uncontrolled DM2. 100 each 12  . ibuprofen (ADVIL,MOTRIN) 200 MG tablet Take 400-600 mg by mouth 3 (three) times daily as needed. With food.      . Insulin Degludec (TRESIBA FLEXTOUCH) 200 UNIT/ML SOPN Inject 64 Units into the skin daily. 3 pen 3  . Insulin Pen Needle (B-D UF III MINI PEN NEEDLES) 31G X 5 MM MISC USE DAILY WITH INSULIN PEN, DX DIABETES 100 each 3  . Insulin Pen Needle (NOVOFINE PLUS) 32G X 4 MM MISC Inject once a day 100 each 3  . INVOKANA 100 MG TABS tablet take 1 tablet by mouth daily before breakfast 30 tablet 4  . lisinopril (ZESTRIL) 2.5 MG tablet Take 1 tablet (2.5 mg total) by mouth daily. 90 tablet 3  . Melatonin 5 MG TABS Take 1 tablet by mouth at bedtime and may repeat dose one time if needed.    . metFORMIN (GLUCOPHAGE) 1000 MG tablet Take 1 tablet (1,000 mg total) by mouth 2 (two) times daily with a meal.    . Multiple Vitamins-Minerals (MULTIVITAMIN ADULTS PO) Take by mouth.    . ranitidine (ZANTAC) 150 MG tablet Take 150 mg by mouth as needed.       Current Facility-Administered Medications on File Prior to Visit  Medication Dose Route  Frequency Provider Last Rate Last Dose  . 0.9 %  sodium chloride infusion  500 mL Intravenous Continuous Irene Shipper, MD       Allergies  Allergen Reactions  . Other Anaphylaxis    Bee stings  . Chantix [Varenicline Tartrate]     irritable  . Crestor [Rosuvastatin Calcium]     myalgia  . Januvia [Sitagliptin [place] in LFTs. Occurred with janumet, but prev tolerated  metformin alone  . Lipitor [Atorvastatin Calcium]     myalgia   Family History  Problem Relation Age of Onset  . Diabetes Mother   . Diabetes Father   . Prostate cancer Father        dx'd in his early 48s  . Hyperlipidemia Brother   . Colon cancer Paternal Uncle        dx'd near age 58  . Diabetes Maternal Uncle   . Esophageal cancer Neg Hx   . Stomach cancer Neg Hx   . Rectal cancer Neg Hx   Pt has FH of DM in Mother, father, M uncle.   PE: BP 128/80 (BP Location: Right Arm, Patient Position: Sitting, Cuff Size: Large)   Pulse (!) 105   Ht 6\' 1"  (1.854 m)   Wt (!) 304 lb (137.9 kg)   SpO2 98%   BMI 40.11 kg/m  Wt Readings from Last 3 Encounters:  03/10/18 (!) 304 lb (137.9 kg)  03/06/18 298 lb 4 oz (135.3 kg)  11/25/17 (!) 313 lb 12.8 oz (142.3 kg)   Constitutional: overweight, in NAD Eyes: PERRLA, EOMI, no exophthalmos ENT: moist mucous membranes, no thyromegaly, no cervical lymphadenopathy Cardiovascular: Tachycardia, RR, No MRG Respiratory: CTA B Gastrointestinal: abdomen soft, NT, ND, BS+ Musculoskeletal: no deformities, strength intact in all 4 Skin: moist, warm, no rashes Neurological: no tremor with outstretched hands, DTR normal in all 4  ASSESSMENT: 1. DM2, insulin-dependent, uncontrolled, with complications - PN  2. HL  3.  Obesity class III  PLAN:  1. Patient with long-standing, uncontrolled, diabetes, on metformin, Invokana, and basal insulin.  At last visit, sugars were higher after meals off Invokana so we restarted this and I gave him a coupon card to help with  cost. - He lost the weight since last visit, but gained few pounds back due to steroids - Sugars are still fairly high, so at  this time, we discussed about starting an SGLT2 inhibitor.  We will try Ozempic.  I advised him about benefits and possible side effects.  He has no personal history of pancreatitis or family history of medullary thyroid cancer - He is having problems with his freestyle libre CGM coming off (he does not have a lot of data at this visit) so we discussed about using several barrier formulations to improve adhesion of the sensor to his skin (see below) - I suggested to:  Patient Instructions  Please continue: - Tresiba U200 64 units daily - Metformin 1000 mg 2x a day with meals. - Invokana 100 mg daily in a.m.  Please start Ozempic 0.25 mg weekly for 4 weeks, then increase to 0.5 mg weekly.  Please return in 3 months with your sugar log.   You can use these for the CGM: - Skin tac wipes or solution - IV 3000 membrane - Tegaderm film  - Reviewed most recent HbA1c from 2 days ago, which was 7.7%, only slightly decreased - continue checking sugars at different times of the day - check 1x a day, rotating checks - advised for yearly eye exams >> he is UTD - Return to clinic in 3 mo with sugar log    2. HL - Reviewed latest lipid panel from 06/2017: LDL very high, as were the other lipid fractions.  We started Lescol XL at that time, which is associated with less muscle pain.   - He is tolerating the Lescol well, without muscle pain - Needs another lipid panel, but will check this at  next visit since he ate this morning.  3.  Obesity class III - Invokana should help and will also start Ozempic, which should also help  Philemon Kingdom, MD PhD [facility] Endocrinology

## 2018-03-22 ENCOUNTER — Other Ambulatory Visit: Payer: Self-pay | Admitting: Internal Medicine

## 2018-03-24 NOTE — Telephone Encounter (Signed)
Yes, ok 

## 2018-03-24 NOTE — Telephone Encounter (Signed)
Is this okay to refill? 

## 2018-03-31 ENCOUNTER — Ambulatory Visit: Payer: 59 | Admitting: Family Medicine

## 2018-03-31 ENCOUNTER — Encounter: Payer: Self-pay | Admitting: Family Medicine

## 2018-03-31 VITALS — BP 142/90 | HR 91 | Temp 98.3°F | Ht 73.0 in | Wt 304.8 lb

## 2018-03-31 DIAGNOSIS — E1140 Type 2 diabetes mellitus with diabetic neuropathy, unspecified: Secondary | ICD-10-CM | POA: Diagnosis not present

## 2018-03-31 DIAGNOSIS — Z23 Encounter for immunization: Secondary | ICD-10-CM | POA: Diagnosis not present

## 2018-03-31 DIAGNOSIS — Z794 Long term (current) use of insulin: Secondary | ICD-10-CM | POA: Diagnosis not present

## 2018-03-31 DIAGNOSIS — E785 Hyperlipidemia, unspecified: Secondary | ICD-10-CM | POA: Diagnosis not present

## 2018-03-31 DIAGNOSIS — G4700 Insomnia, unspecified: Secondary | ICD-10-CM | POA: Diagnosis not present

## 2018-03-31 MED ORDER — TYPHOID VACCINE PO CPDR: 1.0000 | DELAYED_RELEASE_CAPSULE | ORAL | 0 refills | Status: DC

## 2018-03-31 NOTE — Patient Instructions (Addendum)
I would stop the fluvastatin for about 1 week.  See if that helps.   Start the typhoid vaccine in the meantime.  Update me early next week.  Take care.  Glad to see you.

## 2018-03-31 NOTE — Progress Notes (Signed)
He has some upper back pain and some pain down the L arm.  No numbness.  L>R shoulder pain.  Recently went for a massage.   He had fallen and hit his chin, prior to some of his scalp tingling starting.  This event was around 03/01/2018.  He had some local swelling on the chin but no LOC with the fall.    He is on statin, had been able to tolerate it initially but unclear if it causes some of the current sx.  He can exert/heavy lifting w/o CP. D/w pt.    He had a little more heartburn recently.  Belching helps.  She has some throat irritation, noted chronically but intermittently.    He has occ fluttering in his chest w/o pain. He cut back on caffeine but didn't quit cold [place].  He was able to get better sleep recently.  He didn't feel anxious or depressed.   His wedding is coming up, d/w pt. he will be traveling internationally. He would need typhoid vaccine prior to trip to [place].  Reasonable to check MMR titer prev to travel.  See notes on labs.  Meds, vitals, and allergies reviewed.   ROS: Per HPI unless specifically indicated in ROS section   GEN: nad, alert and oriented HEENT: mucous membranes moist NECK: supple w/o LA CV: rrr PULM: ctab, no inc wob ABD: soft, +bs EXT: no edema SKIN: no acute rash

## 2018-04-01 LAB — MEASLES/MUMPS/RUBELLA IMMUNITY
Mumps IgG: <9 AU/mL — ABNORMAL LOW
Rubeola IgG: <25 AU/mL — ABNORMAL LOW

## 2018-04-02 DIAGNOSIS — Z23 Encounter for immunization: Secondary | ICD-10-CM | POA: Insufficient documentation

## 2018-04-02 NOTE — Assessment & Plan Note (Signed)
Previously vaccinated per patient report.  See notes on labs.

## 2018-04-02 NOTE — Assessment & Plan Note (Signed)
Improved recently per patient report.

## 2018-04-02 NOTE — Assessment & Plan Note (Signed)
He has had more heartburn recently.  Belching clearly helps.  Unclear if some of this is related to metformin use.  We opted not to change his medicine yet.  He is going to have a trial off statin and see if that helps with the muscle aches.

## 2018-04-02 NOTE — Assessment & Plan Note (Signed)
Unclear if he is having muscle aches related to statin use.  Reasonable to stop it for a brief period of time and he does not have exertional chest pain.  See after visit summary.

## 2018-04-04 ENCOUNTER — Telehealth: Payer: Self-pay

## 2018-04-04 NOTE — Telephone Encounter (Signed)
Copied from [place] [phone]. Topic: Inquiry >> Apr 04, 2018  2:05 PM Conception Chancy, NT wrote: Reason for CRM: patient is calling and is requesting that on 04/09/18 polio booster as well. Please advise.

## 2018-04-04 NOTE — Telephone Encounter (Signed)
I spoke with [facility] RN team lead and we do not have the polio booster and difficult to order special immunizations. Recommend pt to get at travel center or health dept. Pt is going to [place]. Pt notified as instructed and voiced understanding. Pt will contact health dept and if wants to cancel nurse visit next week for MMR pt will cb.

## 2018-04-07 DIAGNOSIS — Z23 Encounter for immunization: Secondary | ICD-10-CM | POA: Diagnosis not present

## 2018-04-09 ENCOUNTER — Ambulatory Visit: Payer: 59

## 2018-04-11 DIAGNOSIS — S61201A Unspecified open wound of left index finger without damage to nail, initial encounter: Secondary | ICD-10-CM | POA: Diagnosis not present

## 2018-05-12 ENCOUNTER — Ambulatory Visit
Admission: RE | Admit: 2018-05-12 | Discharge: 2018-05-12 | Disposition: A | Discharge status: Home / Self Care | Payer: 59 | Source: Ambulatory Visit | Attending: Family Medicine | Admitting: Family Medicine

## 2018-05-12 ENCOUNTER — Ambulatory Visit: Payer: 59 | Admitting: Family Medicine

## 2018-05-12 VITALS — BP 122/76 | HR 110 | Temp 98.4°F | Ht 73.0 in | Wt 304.0 lb

## 2018-05-12 DIAGNOSIS — M79605 Pain in left leg: Secondary | ICD-10-CM | POA: Diagnosis not present

## 2018-05-12 DIAGNOSIS — M7989 Other specified soft tissue disorders: Secondary | ICD-10-CM | POA: Insufficient documentation

## 2018-05-12 DIAGNOSIS — M79662 Pain in left lower leg: Secondary | ICD-10-CM | POA: Diagnosis not present

## 2018-05-12 NOTE — Patient Instructions (Signed)
Please stop at desk to get your ultrasound scheduled  Elevate leg as much as possible  Can continue to take ibuprofen 400-600 mg every 8 to 12 hours as needed

## 2018-05-12 NOTE — Progress Notes (Signed)
Subjective:    Patient ID: Jerry Eaton, male    DOB: April 09, 1971, 47 y.o.   MRN: [id number]  HPI This is a 47 yo male who presents today with left leg pain and tenderness x 4 days. Noticed pain in left foot and swelling 05/09/18, went down with elevation. Has pain and tenderness on front and sides of leg, "feels like a bruise." Took 600 mg advil yesterday. No unusual activity, may have rolled his foot.  Returned from [place] and [place] about 8 days ago. No chest pain, no SOB.   Past Medical History:  Diagnosis Date  . Carpal tunnel syndrome   . Diabetes mellitus   . Family history of polyps in the colon   . Fatty liver   . GERD (gastroesophageal reflux disease)   . History of chicken pox   . Hyperlipidemia    no meds now 05-28-16  . Obesity   . Sleep apnea    not using CPAP at this time 05-28-16   Past Surgical History:  Procedure Laterality Date  . COLONOSCOPY     2012, 2017  . LUMBAR DISC SURGERY     L4/L5  . PILONIDAL CYST EXCISION     2001   Family History  Problem Relation Age of Onset  . Diabetes Mother   . Diabetes Father   . Prostate cancer Father        dx'd in his early 61s  . Hyperlipidemia Brother   . Colon cancer Paternal Uncle        dx'd near age 45  . Diabetes Maternal Uncle   . Esophageal cancer Neg Hx   . Stomach cancer Neg Hx   . Rectal cancer Neg Hx    Social History   Tobacco Use  . Smoking status: Current Every Day Smoker    Packs/day: 1.00    Years: 20.00    Pack years: 20.00    Types: Cigarettes  . Smokeless tobacco: Never Used  Substance Use Topics  . Alcohol use: Yes    Comment: 10-15 per week, usually on the weekends  . Drug use: No     Review of Systems Per HPI    Objective:   Physical Exam  Constitutional: He is oriented to person, place, and time. He appears well-developed and well-nourished. No distress.  HENT:  Head: Normocephalic and atraumatic.  Cardiovascular: Normal rate, regular rhythm and normal heart  sounds.  Musculoskeletal:  Left ankle with mild edema. Tenderness to palpation behind knee (mild), mid calf. No erythema, no palpable masses.   Neurological: He is alert and oriented to person, place, and time.  Skin: Skin is warm and dry. He is not diaphoretic.  Psychiatric: He has a normal mood and affect. His behavior is normal. Judgment and thought content normal.  Vitals reviewed.    BP 122/76 (BP Location: Right Arm, Patient Position: Sitting, Cuff Size: Large)   Pulse (!) 110   Temp 98.4 F (36.9 C) (Oral)   Ht 6\' 1"  (1.854 m)   Wt (!) 304 lb (137.9 kg)   SpO2 97%   BMI 40.11 kg/m   Recheck HR 88    Assessment & Plan:  1. Pain and swelling of left lower leg - given recent travel and pain/swelling, will get doppler US - US Venous Img Lower Unilateral Left; Future - Elevate leg as possible, continue otc NSAIDs   Clarene Reamer, FNP-BC   [facility]  05/12/2018 4:28 PM

## 2018-05-13 ENCOUNTER — Encounter: Payer: Self-pay | Admitting: Family Medicine

## 2018-05-16 ENCOUNTER — Other Ambulatory Visit: Payer: Self-pay | Admitting: Family Medicine

## 2018-05-16 ENCOUNTER — Other Ambulatory Visit: Payer: Self-pay | Admitting: Internal Medicine

## 2018-05-16 DIAGNOSIS — E1140 Type 2 diabetes mellitus with diabetic neuropathy, unspecified: Secondary | ICD-10-CM

## 2018-05-16 DIAGNOSIS — Z794 Long term (current) use of insulin: Principal | ICD-10-CM

## 2018-05-20 ENCOUNTER — Telehealth: Payer: Self-pay

## 2018-05-20 MED ORDER — ONETOUCH ULTRASOFT LANCETS MISC: 1 refills | Status: DC

## 2018-05-20 MED ORDER — GLUCOSE BLOOD VI STRP: ORAL_STRIP | 1 refills | Status: DC

## 2018-05-20 NOTE — Telephone Encounter (Signed)
Patient says he has tried Skin Tac and extra tape and "nothing works".  Says has lost a lot of money  I gave him the 800 telephone number for Freestyle to try to get him more sensors.  Suggested a arm band you get from the drug store as well to help with keeping the sensor in place.

## 2018-05-20 NOTE — Telephone Encounter (Signed)
patient called today about the freestyle libre and he is having trouble keeping them attached- patient would like to get prescription for onetouch verio test strip and lancets sent to his pharmacy ([facility]) patient would like to know if MD recommends a different meter or is the onetouch ok please call patient when this has been ordered

## 2018-05-20 NOTE — Telephone Encounter (Signed)
Sent one touch to pharmacy, pt did not answer LM

## 2018-05-20 NOTE — Telephone Encounter (Signed)
Do you have a meter preference for pt if not I will send one touch as requested, and also sending to [place] to review the issue with [employer]. Please advise

## 2018-05-20 NOTE — Telephone Encounter (Signed)
I would suggest to get whichever is cheaper for him

## 2018-07-14 ENCOUNTER — Encounter: Payer: Self-pay | Admitting: Internal Medicine

## 2018-07-14 ENCOUNTER — Ambulatory Visit: Payer: 59 | Admitting: Internal Medicine

## 2018-07-14 VITALS — BP 118/78 | HR 91 | Ht 73.0 in | Wt 304.0 lb

## 2018-07-14 DIAGNOSIS — E1140 Type 2 diabetes mellitus with diabetic neuropathy, unspecified: Secondary | ICD-10-CM

## 2018-07-14 DIAGNOSIS — Z23 Encounter for immunization: Secondary | ICD-10-CM | POA: Diagnosis not present

## 2018-07-14 DIAGNOSIS — E785 Hyperlipidemia, unspecified: Secondary | ICD-10-CM | POA: Diagnosis not present

## 2018-07-14 DIAGNOSIS — Z794 Long term (current) use of insulin: Secondary | ICD-10-CM | POA: Diagnosis not present

## 2018-07-14 LAB — LIPID PANEL
CHOLESTEROL: 211 mg/dL — AB (ref 0–200)
HDL: 32.4 mg/dL — AB (ref >39.00)
LDL Cholesterol: 148 mg/dL — ABNORMAL HIGH (ref 0–99)
NonHDL: 178.25
Total CHOL/HDL Ratio: 7
Triglycerides: 151 mg/dL — ABNORMAL HIGH (ref 0.0–149.0)
VLDL: 30.2 mg/dL (ref 0.0–40.0)

## 2018-07-14 LAB — MICROALBUMIN / CREATININE URINE RATIO
Creatinine,U: 82.1 mg/dL
Microalb Creat Ratio: 4.6 mg/g (ref 0.0–30.0)
Microalb, Ur: 3.8 mg/dL — ABNORMAL HIGH (ref 0.0–1.9)

## 2018-07-14 LAB — POCT GLYCOSYLATED HEMOGLOBIN (HGB A1C): HEMOGLOBIN A1C: 7.2 % — AB (ref 4.0–5.6)

## 2018-07-14 MED ORDER — SEMAGLUTIDE (1 MG/DOSE) 2 MG/1.5ML ~~LOC~~ SOPN: 1.0000 mg | PEN_INJECTOR | SUBCUTANEOUS | 3 refills | Status: DC

## 2018-07-14 NOTE — Progress Notes (Signed)
Patient ID: Jerry Eaton, male   DOB: 1971/07/01, 47 y.o.   MRN: [id number]   HPI: Jerry Eaton is a 47 y.o.-year-old male, returning for follow-up for DM2, dx in ~2013, insulin-dependent since 2016-2017, uncontrolled, with complications (PN). Last visit 4 mo ago.  He got married since last visit.  He traveled to [place] to meet with his fiance.  They started the paperwork for her to move here.  Last hemoglobin A1c was: Lab Results  Component Value Date   HGBA1C 7.7 (H) 03/06/2018   HGBA1C 7.8 11/25/2017   HGBA1C 7.0 08/20/2017   Pt was on a regimen of: - Metformin 500 mg 1x a day, with meals (Rx'ed BID, forgets second dose) - Lantus 65 units at bedtime  Tried JanuMet >> transaminitis.  Now on: - Tresiba U200 64 units daily - Metformin 1000 mg 2x a day with meals. - Invokana 100 mg daily in a.m. - Ozempic 0.5 mg weekly - added 02/2018  He checks his sugars many times a day with his Freestyle Libre CGM - uses Skin Taq patches as he cannot tolerate the adhesive): Freestyle Libre CGM parameters: - average: 170 +/- 42.8 - coefficient of variation: 25.2 (19-25%) - time in range:  - low (<70): 0% - normal (70-180): 62% - high (>180): 38% (6% are >250)  - am: 98-140 >> 112, 122, 140-200, 242, 257 (steroids) >> 120-170 - 2h after b'fast:100-200 >> 190 x1 >> n/c >> 180-277 - before lunch: 110-180 >> 150 >> n/c >> 130-190 - 2h after lunch:  180-202 >> 160-180 >> n/c - before dinner: 90-190 >> 94, 111-153 >> n/c - 2h after dinner:180-200 >> 188-225 >> 150-250 - bedtime: n/c - nighttime: n/c He has hypoglycemia awareness in the 70s.  Glucometer: [employer] IQ; NOW freestyle libre CGM  Pt's meals are: - Breakfast: bisquits - Lunch: sandwich, burgers (may skip) - Dinner: burgers, chicken, steak - Snacks: 0-1  He is traveling a lot for work.  -no CKD, last BUN/creatinine:  Lab Results  Component Value Date   BUN 15 03/06/2018   BUN 12 07/01/2017   CREATININE 0.76  03/06/2018   CREATININE 0.79 07/01/2017   ACR normal: Lab Results  Component Value Date   MICRALBCREAT 4.1 02/27/2017   MICRALBCREAT 0.6 09/19/2015   MICRALBCREAT 0.7 07/26/2014   On Lisinopril 2.5.  - + HL. Last set of lipids: Lab Results  Component Value Date   CHOL 244 (H) 07/01/2017   HDL 34.20 (L) 07/01/2017   LDLCALC 179 (H) 07/01/2017   LDLDIRECT 163.5 05/16/2012   TRIG 152.0 (H) 07/01/2017   CHOLHDL 7 07/01/2017  Tried Crestor and Lipitor >> joint and muscle pain.   Tried CoQ10 with these but not helping.  We started Lescol XL in 06/2017 that he has no side effects from this. -  last eye exam was in  09/2017: No DR - + improved numbness and tingling in feet  He travels to the [place] once a year.  ROS: Constitutional: no weight gain/no weight loss, no fatigue, no subjective hyperthermia, no subjective hypothermia Eyes: no blurry vision, no xerophthalmia ENT: no sore throat, no nodules palpated in throat, no dysphagia, no odynophagia, no hoarseness Cardiovascular: no CP/no SOB/no palpitations/no leg swelling Respiratory: no cough/no SOB/no wheezing Gastrointestinal: no N/no V/no D/no C/no acid reflux Musculoskeletal: no muscle aches/no joint aches Skin: no rashes, no hair loss Neurological: no tremors/no numbness/no tingling/no dizziness  I reviewed pt's medications, allergies, PMH, social hx, family hx, and  changes were documented in the history of present illness. Otherwise, unchanged from my initial visit note.  Past Medical History:  Diagnosis Date  . Carpal tunnel syndrome   . Diabetes mellitus   . Family history of polyps in the colon   . Fatty liver   . GERD (gastroesophageal reflux disease)   . History of chicken pox   . Hyperlipidemia    no meds now 05-28-16  . Obesity   . Sleep apnea    not using CPAP at this time 05-28-16   Past Surgical History:  Procedure Laterality Date  . COLONOSCOPY     2012, 2017  . LUMBAR DISC SURGERY     L4/L5  .  PILONIDAL CYST EXCISION     2001   Social History   Social History  . Marital status: Single    Spouse name: N/A  . Number of children: 0   Social History Main Topics  . Smoking status: Current Every Day Smoker    Packs/day: 1.00    Years: 20.00    Types: Cigarettes  . Smokeless tobacco: Never Used  . Alcohol use Yes     Comment: 10-15 per week, usually on the weekends  . 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 Continuous Blood Gluc Sensor (FREESTYLE LIBRE 14 DAY SENSOR) MISC 1 each by Does not apply route every 14 (fourteen) days. Change every 2 weeks 2 each 11  . glucose blood test strip Check sugar before and 2 hours after meals. Okay to check >3 times per day- insulin treated, uncontrolled DM2. 300 each 1  . ibuprofen (ADVIL,MOTRIN) 200 MG tablet Take 400-600 mg by mouth 3 (three) times daily as needed. With food.      . Insulin Degludec (TRESIBA FLEXTOUCH) 200 UNIT/ML SOPN Inject 64 Units into the skin daily. 9 pen 3  . Insulin Pen Needle (NOVOFINE PLUS) 32G X 4 MM MISC Inject once a day 100 each 3  . INVOKANA 100 MG TABS tablet take 1 tablet by mouth daily before breakfast 30 tablet 4  . Lancets (ONETOUCH ULTRASOFT) lancets Check sugar before and 2 hours after meals. Okay to check >3 times per day- insulin treated, uncontrolled DM2. 300 each 1  . lisinopril (PRINIVIL,ZESTRIL) 2.5 MG tablet TAKE ONE TABLET BY MOUTH ONE TIME DAILY  90 tablet 2  . metFORMIN (GLUCOPHAGE) 1000 MG tablet Take 1 tablet (1,000 mg total) by mouth 2 (two) times daily with a meal.    . metFORMIN (GLUCOPHAGE) 1000 MG tablet take 1 tablet by mouth 2 times daily with meals 180 tablet 0  . ranitidine (ZANTAC) 150 MG tablet Take 150 mg by mouth as needed.      . Semaglutide (OZEMPIC) 0.25 or 0.5 MG/DOSE SOPN Inject 0.5 mg into the  skin once a week. 2 pen 5  . typhoid (VIVOTIF) DR capsule Take 1 capsule by mouth every other day. 4 capsule 0   Current Facility-Administered Medications on File Prior to Visit  Medication Dose Route Frequency Provider Last Rate Last Dose  . 0.9 %  sodium chloride infusion  500 mL Intravenous Continuous Irene Shipper, MD       Allergies  Allergen Reactions  . Other Anaphylaxis    Bee stings  . Chantix [Varenicline Tartrate]  irritable  . Crestor [Rosuvastatin Calcium]     myalgia  . Januvia [Sitagliptin [place] in LFTs. Occurred with janumet, but prev tolerated metformin alone  . Lipitor [Atorvastatin Calcium]     myalgia   Family History  Problem Relation Age of Onset  . Diabetes Mother   . Diabetes Father   . Prostate cancer Father        dx'd in his early 86s  . Hyperlipidemia Brother   . Colon cancer Paternal Uncle        dx'd near age 67  . Diabetes Maternal Uncle   . Esophageal cancer Neg Hx   . Stomach cancer Neg Hx   . Rectal cancer Neg Hx   Pt has FH of DM in Mother, father, M uncle.   PE: BP 118/78   Pulse 91   Ht 6\' 1"  (1.854 m)   Wt (!) 304 lb (137.9 kg)   SpO2 97%   BMI 40.11 kg/m  Wt Readings from Last 3 Encounters:  07/14/18 (!) 304 lb (137.9 kg)  05/12/18 (!) 304 lb (137.9 kg)  03/31/18 (!) 304 lb 12 oz (138.2 kg)   Constitutional: overweight, in NAD Eyes: PERRLA, EOMI, no exophthalmos ENT: moist mucous membranes, no thyromegaly, no cervical lymphadenopathy Cardiovascular: RRR, No MRG Respiratory: CTA B Gastrointestinal: abdomen soft, NT, ND, BS+ Musculoskeletal: no deformities, strength intact in all 4 Skin: moist, warm, no rashes Neurological: no tremor with outstretched hands, DTR normal in all 4  ASSESSMENT: 1. DM2, insulin-dependent, uncontrolled, with complications - PN  No personal history of pancreatitis or family history of medullary thyroid cancer  2. HL  3.  Obesity class III  PLAN:  1. Patient with  long-standing, uncontrolled, type 2 diabetes, on metformin, Invokana, basal insulin, and GLP-1 receptor agonist added at last visit.  At that time, his sugars were fairly high and target, at 7.7%.  He is tolerating Ozempic well, without nausea or vomiting. -At last visit, he was having problems with his freestyle libre CGM coming off so we discussed using several barrier formulations to improve adhesions of the sensor to the skin: - Skin tac wipes or solution >> he started to use skin tac wipes and his work - IV 3000 membrane - Tegaderm film However, his sensor keeps coming off and he had it replaced several times.  He thinks he may go back to checking his sugars manually. -At this visit, sugars have improved slightly, but they are still high after breakfast and dinner.  He does not check during the day.  We discussed about increasing Ozempic to 1 mg weekly.  He agrees with the plan. - I suggested to:  Patient Instructions  Please continue: - Tresiba U200 64 units daily - Metformin 1000 mg 2x a day with meals. - Invokana 100 mg daily in a.m.  Please increase the: - Ozempic to 1 mg weekly  Please return in 4 months with your sugar log.   - today, HbA1c is 7.2% (improved) - continue checking sugars at different times of the day - check 1x a day, rotating checks - advised for yearly eye exams >> he is UTD - We will check a lipid panel and an ACR today - We will give him the flu shot today - Return to clinic in 4 mo with sugar log     2. HL - Reviewed latest lipid panel from 06/2017: LDL very high, as were the other lipid fractions Lab Results  Component  Value Date   CHOL 244 (H) 07/01/2017   HDL 34.20 (L) 07/01/2017   LDLCALC 179 (H) 07/01/2017   LDLDIRECT 163.5 05/16/2012   TRIG 152.0 (H) 07/01/2017   CHOLHDL 7 07/01/2017  - Continues Lescol XL, started after the above results returned -  without side effects. - check Lipids today  3.  Obesity class III - Invokana and Ozempic  should also help with weight loss -We will increase Ozempic now.  Office Visit on 07/14/2018  Component Date Value Ref Range Status  . Cholesterol 07/14/2018 211* 0 - 200 mg/dL Final   ATP III Classification       Desirable:  < 200 mg/dL               Borderline High:  200 - 239 mg/dL          High:  > = 240 mg/dL  . Triglycerides 07/14/2018 151.0* 0.0 - 149.0 mg/dL Final   Normal:  <150 mg/dLBorderline High:  150 - 199 mg/dL  . HDL 07/14/2018 32.40* >39.00 mg/dL Final  . VLDL 07/14/2018 30.2  0.0 - 40.0 mg/dL Final  . LDL Cholesterol 07/14/2018 148* 0 - 99 mg/dL Final  . Total CHOL/HDL Ratio 07/14/2018 7   Final                  Men          Women1/2 Average Risk     3.4          3.3Average Risk          5.0          4.42X Average Risk          9.6          7.13X Average Risk          15.0          11.0                      . NonHDL 07/14/2018 178.25   Final   NOTE:  Non-HDL goal should be 30 mg/dL higher than patient's LDL goal (i.e. LDL goal of < 70 mg/dL, would have non-HDL goal of < 100 mg/dL)  . Microalb, Ur 07/14/2018 3.8* 0.0 - 1.9 mg/dL Final  . Creatinine,U 07/14/2018 82.1  mg/dL Final  . Microalb Creat Ratio 07/14/2018 4.6  0.0 - 30.0 mg/g Final  . Hemoglobin A1C 07/14/2018 7.2* 4.0 - 5.6 % Final   Lipid panel is similar to before, I wonder if he is taking the Lescol consistently.  I will check with him.  Philemon Kingdom, MD PhD [facility] Endocrinology

## 2018-07-14 NOTE — Patient Instructions (Signed)
Please continue: - Tresiba U200 64 units daily - Metformin 1000 mg 2x a day with meals. - Invokana 100 mg daily in a.m.  Please increase the: - Ozempic to 1 mg weekly  Please return in 4 months with your sugar log.

## 2018-07-15 ENCOUNTER — Telehealth: Payer: Self-pay

## 2018-07-15 NOTE — Telephone Encounter (Signed)
Left message for patient to return our call at [phone].  

## 2018-07-15 NOTE — Telephone Encounter (Signed)
-----   Message from Philemon Kingdom, MD sent at 07/14/2018  5:06 PM EDT ----- Lenna Sciara, can you please call pt: No increased urinary proteins.  Lipid panel is very similar to before, LDL improved, but still above goal.  Is he taking the Lescol (fluvastatin) consistently?

## 2018-07-16 NOTE — Telephone Encounter (Signed)
Spoke to patient, his PCP stopped the statin due to side effects of muscle pains.

## 2018-07-16 NOTE — Telephone Encounter (Signed)
Left message for patient to return our call at [phone].  

## 2018-07-16 NOTE — Telephone Encounter (Signed)
Aha, in this case, he absolutely needs to start working more on diet: lower fatty fods, lower concentrated sweets, veggies and greens as much as possible, with every meal.

## 2018-07-18 NOTE — Telephone Encounter (Signed)
Notified patient of message from Dr. Gherghe, patient expressed understanding and agreement. No further questions.  

## 2018-09-24 LAB — HM DIABETES EYE EXAM

## 2018-10-01 DIAGNOSIS — E119 Type 2 diabetes mellitus without complications: Secondary | ICD-10-CM | POA: Diagnosis not present

## 2018-10-10 ENCOUNTER — Encounter: Payer: Self-pay | Admitting: Family Medicine

## 2018-10-11 ENCOUNTER — Other Ambulatory Visit: Payer: Self-pay | Admitting: Internal Medicine

## 2018-10-17 DIAGNOSIS — L720 Epidermal cyst: Secondary | ICD-10-CM | POA: Diagnosis not present

## 2018-10-17 DIAGNOSIS — D225 Melanocytic nevi of trunk: Secondary | ICD-10-CM | POA: Diagnosis not present

## 2018-10-17 DIAGNOSIS — Z1283 Encounter for screening for malignant neoplasm of skin: Secondary | ICD-10-CM | POA: Diagnosis not present

## 2018-10-27 ENCOUNTER — Ambulatory Visit: Payer: 59 | Admitting: Family Medicine

## 2018-10-27 ENCOUNTER — Encounter: Payer: Self-pay | Admitting: Family Medicine

## 2018-10-27 VITALS — BP 142/80 | HR 96 | Temp 98.7°F | Ht 73.0 in | Wt 298.2 lb

## 2018-10-27 DIAGNOSIS — S3992XA Unspecified injury of lower back, initial encounter: Secondary | ICD-10-CM

## 2018-10-27 DIAGNOSIS — Z23 Encounter for immunization: Secondary | ICD-10-CM

## 2018-10-27 MED ORDER — TRAMADOL HCL 50 MG PO TABS: 50.0000 mg | ORAL_TABLET | Freq: Three times a day (TID) | ORAL | 0 refills | Status: DC | PRN

## 2018-10-27 NOTE — Patient Instructions (Addendum)
Milk of magnesia or miralax are okay to use as needed.    Use ibuprofen with food, then tramadol if needed (sedation caution).  Use a doughnut pillow and ice as needed.   2nd dose MMR today.   Take care.  Glad to see you.

## 2018-10-27 NOTE — Progress Notes (Signed)
He got married in the meantime.   Ceremony was in [place].  They had a good trip.   His father in law died and was trying to move luggage down the stairs for his wife and slipped.  He went down the stairs and hit R olecranon and tailbone.  Then he had tor drive them to [place] and back.  This was 2 days ago.  No LOC.    Local bruising.  Still sore.  Taking ibuprofen and using ice.  Still with coccygeal pain.    He needs 2nd MMR dose.  [facility]   Some constipation on ozempic but this is manageable.  Not painful.    Meds, vitals, and allergies reviewed.   ROS: Per HPI unless specifically indicated in ROS section   nad ncat Neck supple, no LA rrr ctab abd soft, not ttp, normal bowel sounds Back not tender to palpation in the midline except for coccyx ttp midline.   Grossly neurovascularly intact distally in the bilateral lower extremities.

## 2018-10-29 DIAGNOSIS — S3992XA Unspecified injury of lower back, initial encounter: Secondary | ICD-10-CM | POA: Insufficient documentation

## 2018-10-29 DIAGNOSIS — Z23 Encounter for immunization: Secondary | ICD-10-CM | POA: Insufficient documentation

## 2018-10-29 NOTE — Assessment & Plan Note (Signed)
He previously had first dose done at the health department in 2019 per his report.  He can get me a copy of those records in the future.  Second dose done today.

## 2018-10-29 NOTE — Assessment & Plan Note (Addendum)
Discussed options, imaging is likely not useful.  Usually the main goal is pain control. Use ibuprofen with food, then tramadol if needed (sedation caution). Use a doughnut pillow and ice as needed.  If he continues to have constipation, then Milk of magnesia or miralax are okay to use as needed.

## 2018-11-24 ENCOUNTER — Ambulatory Visit: Payer: 59 | Admitting: Internal Medicine

## 2018-11-24 ENCOUNTER — Encounter: Payer: Self-pay | Admitting: Internal Medicine

## 2018-11-24 VITALS — BP 128/90 | HR 88 | Ht 73.0 in | Wt 303.0 lb

## 2018-11-24 DIAGNOSIS — E1140 Type 2 diabetes mellitus with diabetic neuropathy, unspecified: Secondary | ICD-10-CM | POA: Diagnosis not present

## 2018-11-24 DIAGNOSIS — E785 Hyperlipidemia, unspecified: Secondary | ICD-10-CM

## 2018-11-24 DIAGNOSIS — Z794 Long term (current) use of insulin: Secondary | ICD-10-CM

## 2018-11-24 LAB — POCT GLYCOSYLATED HEMOGLOBIN (HGB A1C): Hemoglobin A1C: 7.2 % — AB (ref 4.0–5.6)

## 2018-11-24 MED ORDER — METFORMIN HCL 1000 MG PO TABS: 1000.0000 mg | ORAL_TABLET | Freq: Two times a day (BID) | ORAL | Status: DC

## 2018-11-24 MED ORDER — EZETIMIBE 10 MG PO TABS
10.0000 mg | ORAL_TABLET | Freq: Every day | ORAL | 11 refills | Status: DC
Start: 2018-11-24 — End: 2019-12-16

## 2018-11-24 MED ORDER — SEMAGLUTIDE (1 MG/DOSE) 2 MG/1.5ML ~~LOC~~ SOPN: 1.0000 mg | PEN_INJECTOR | SUBCUTANEOUS | 3 refills | Status: DC

## 2018-11-24 MED ORDER — CANAGLIFLOZIN 100 MG PO TABS: ORAL_TABLET | ORAL | 3 refills | Status: DC

## 2018-11-24 NOTE — Progress Notes (Signed)
Patient ID: Jerry Eaton, male   DOB: 11/09/1970, 48 y.o.   MRN: [id number]   HPI: Jerry Eaton is a 48 y.o.-year-old male, returning for follow-up for DM2, dx in ~2013, insulin-dependent since 2016-2017, uncontrolled, with complications (PN). Last visit 4.5 months ago.  He was travelling over the [place] >> missed meds.  However, during that time, his wife was here from the [place] for almost a month.  He was eating better when she was here.  Last hemoglobin A1c was: Lab Results  Component Value Date   HGBA1C 7.2 (A) 07/14/2018   HGBA1C 7.7 (H) 03/06/2018   HGBA1C 7.8 11/25/2017   Pt was on a regimen of: - Metformin 500 mg 1x a day, with meals (Rx'ed BID, forgets second dose) - Lantus 65 units at bedtime  Tried JanuMet >> transaminitis.  Now on: - Tresiba U200 64 units daily - Metformin 1000 mg 2x a day with meals. - Invokana 100 mg daily in a.m. - Ozempic 1 mg weekly - added 02/2018 - some constipation  He was checking his sugars at least 4 times a day with his freestyle libre CGM- used Skin Taq patches, but still coming off >> stopped. - am: 112, 122, 140-200, 242, 257 >> 120-170 >> 156-198, 232, 236 - 2h after b'fast:190 x1 >> n/c >> 180-277 >> n/c  - before lunch:  150 >> n/c >> 130-190 >> n/c - 2h after lunch:  180-202 >> 160-180 >> n/c - before dinner: 90-190 >> 94, 111-153 >> n/c - 2h after dinner: 188-225 >> 150-250 >> n/c - bedtime: n/c - nighttime: n/c He has hypoglycemia awareness in the 70s. Lowest: 150 Highest: 200  Glucometer: OneTouch  Verio IQ; freestyle libre CGM  Pt's meals are: - Breakfast: bisquits - Lunch: sandwich, burgers (may skip) - Dinner: burgers, chicken, steak - Snacks: 0-1  Is traveling a lot for work.  -No CKD, last BUN/creatinine:  Lab Results  Component Value Date   BUN 15 03/06/2018   BUN 12 07/01/2017   CREATININE 0.76 03/06/2018   CREATININE 0.79 07/01/2017   ACR normal: Lab Results  Component Value Date    MICRALBCREAT 4.6 07/14/2018   MICRALBCREAT 4.1 02/27/2017   MICRALBCREAT 0.6 09/19/2015   MICRALBCREAT 0.7 07/26/2014   On lisinopril 2.5.  -+ HL.  Last set of lipids: Lab Results  Component Value Date   CHOL 211 (H) 07/14/2018   HDL 32.40 (L) 07/14/2018   LDLCALC 148 (H) 07/14/2018   LDLDIRECT 163.5 05/16/2012   TRIG 151.0 (H) 07/14/2018   CHOLHDL 7 07/14/2018  He tried Crestor and Lipitor but these caused joint and muscle pain.  Tried co-Q10 but not helping.  We started Lescol XL in 06/2017 >> still side effects from this (shoulder pain). -  last eye exam was: 09/2018: No DR -+ Improved numbness and tingling in feet  He travels to the [place] once a year.  ROS: Constitutional: no weight gain/no weight loss, no fatigue, no subjective hyperthermia, no subjective hypothermia Eyes: no blurry vision, no xerophthalmia ENT: no sore throat, no nodules palpated in neck, no dysphagia, no odynophagia, no hoarseness Cardiovascular: no CP/no SOB/no palpitations/no leg swelling Respiratory: no cough/no SOB/no wheezing Gastrointestinal: no N/no V/no D/+ C/no acid reflux Musculoskeletal: no muscle aches/no joint aches Skin: no rashes, no hair loss Neurological: no tremors/no numbness/no tingling/no dizziness  I reviewed pt's medications, allergies, PMH, social hx, family hx, and changes were documented in the history of present illness. Otherwise, unchanged from my initial  visit note.  Past Medical History:  Diagnosis Date  . Carpal tunnel syndrome   . Diabetes mellitus   . Family history of polyps in the colon   . Fatty liver   . GERD (gastroesophageal reflux disease)   . History of chicken pox   . Hyperlipidemia    no meds now 05-28-16  . Obesity   . Sleep apnea    not using CPAP at this time 05-28-16   Past Surgical History:  Procedure Laterality Date  . COLONOSCOPY     2012, 2017  . LUMBAR DISC SURGERY     L4/L5  . PILONIDAL CYST EXCISION     2001   Social History    Social History  . Marital status: Single    Spouse name: N/A  . Number of children: 0   Social History Main Topics  . Smoking status: Current Every Day Smoker    Packs/day: 1.00    Years: 20.00    Types: Cigarettes  . Smokeless tobacco: Never Used  . Alcohol use Yes     Comment: 10-15 per week, usually on the weekends  . 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 Continuous Blood Gluc Sensor (FREESTYLE LIBRE 14 DAY SENSOR) MISC 1 each by Does not apply route every 14 (fourteen) days. Change every 2 weeks 2 each 11  . glucose blood test strip Check sugar before and 2 hours after meals. Okay to check >3 times per day- insulin treated, uncontrolled DM2. 300 each 1  . ibuprofen (ADVIL,MOTRIN) 200 MG tablet Take 400-600 mg by mouth 3 (three) times daily as needed. With food.      . Insulin Degludec (TRESIBA FLEXTOUCH) 200 UNIT/ML SOPN Inject 64 Units into the skin daily. 9 pen 3  . Insulin Pen Needle (NOVOFINE PLUS) 32G X 4 MM MISC Inject once a day 100 each 3  . INVOKANA 100 MG TABS tablet take 1 tablet by mouth daily before breakfast 30 tablet 3  . Lancets (ONETOUCH ULTRASOFT) lancets Check sugar before and 2 hours after meals. Okay to check >3 times per day- insulin treated, uncontrolled DM2. 300 each 1  . lisinopril (PRINIVIL,ZESTRIL) 2.5 MG tablet TAKE ONE TABLET BY MOUTH ONE TIME DAILY  90 tablet 2  . metFORMIN (GLUCOPHAGE) 1000 MG tablet Take 1 tablet (1,000 mg total) by mouth 2 (two) times daily with a meal.    . ranitidine (ZANTAC) 150 MG tablet Take 150 mg by mouth as needed.      . Semaglutide, 1 MG/DOSE, (OZEMPIC, 1 MG/DOSE,) 2 MG/1.5ML SOPN Inject 1 mg into the skin once a week. 6 pen 3  . traMADol (ULTRAM) 50 MG tablet Take 1 tablet (50 mg total) by mouth every 8 (eight) hours as needed (for pain). 15  tablet 0   Current Facility-Administered Medications on File Prior to Visit  Medication Dose Route Frequency Provider Last Rate Last Dose  . 0.9 %  sodium chloride infusion  500 mL Intravenous Continuous Irene Shipper, MD       Allergies  Allergen Reactions  . Other Anaphylaxis    Bee stings  . Chantix [Varenicline Tartrate]     irritable  . Crestor [Rosuvastatin Calcium]     myalgia  . [place] in  LFTs. Occurred with janumet, but prev tolerated metformin alone  . Lipitor [Atorvastatin Calcium]     myalgia   Family History  Problem Relation Age of Onset  . Diabetes Mother   . Diabetes Father   . Prostate cancer Father        dx'd in his early 67s  . Hyperlipidemia Brother   . Colon cancer Paternal Uncle        dx'd near age 57  . Diabetes Maternal Uncle   . Esophageal cancer Neg Hx   . Stomach cancer Neg Hx   . Rectal cancer Neg Hx   Pt has FH of DM in Mother, father, M uncle.   PE: BP 128/90   Pulse 88   Ht 6\' 1"  (1.854 m)   Wt (!) 303 lb (137.4 kg)   SpO2 96%   BMI 39.98 kg/m  Wt Readings from Last 3 Encounters:  11/24/18 (!) 303 lb (137.4 kg)  10/27/18 298 lb 4 oz (135.3 kg)  07/14/18 (!) 304 lb (137.9 kg)   Constitutional: overweight, in NAD Eyes: PERRLA, EOMI, no exophthalmos ENT: moist mucous membranes, no thyromegaly, no cervical lymphadenopathy Cardiovascular: RRR, No MRG Respiratory: CTA B Gastrointestinal: abdomen soft, NT, ND, BS+ Musculoskeletal: no deformities, strength intact in all 4 Skin: moist, warm, no rashes Neurological: no tremor with outstretched hands, DTR normal in all 4  ASSESSMENT: 1. DM2, insulin-dependent, uncontrolled, with complications - PN  No personal history of pancreatitis or family history of medullary thyroid cancer  2. HL  3.  Obesity class III  PLAN:  1. Patient with longstanding, uncontrolled, type 2 diabetes, on metformin, SGLT 2 inhibitor, basal insulin, and GLP-1 receptor  agonist, with improvement of his sugars after adding Ozempic.  We increased the dose at last visit.  At that time, he was having problems with his freestyle libre CGM coming up so we discussed about sealing it in place by using different methods.  Skin taq wipes worked for him, but he still sounds this extensive and he is not using his meter.  We downloaded this today. -Sugars are high, above target, but he only checks them in the morning.  Strongly advised him to rotate them throughout the day. -He mentions that he missed medication doses over the holidays but he is now back to taking them consistently.  He would like to stay on the same regimen for now while he continues to work on his diet and takes his medicines consistently - I suggested to:  Patient Instructions  Please continue: - Tresiba U200 64 units daily - Metformin 1000 mg 2x a day with meals. - Invokana 100 mg daily in a.m. - Ozempic 1 mg weekly  Start Zetia 10 mg daily.  Please return in 3-4 months with your sugar log.   - today, HbA1c is 7.2% (stable, this did not increase) - Start checking sugars at different times of the day - check 1x a day, rotating checks - advised for yearly eye exams >> he is UTD - Return to clinic in 3-4 mo with sugar log      2. HL - Reviewed latest lipid panel from 06/2018, all fractions are normal, with a high LDL, at 148 Lab Results  Component Value Date   CHOL 211 (H) 07/14/2018   HDL 32.40 (L) 07/14/2018   LDLCALC 148 (H) 07/14/2018   LDLDIRECT 163.5 05/16/2012   TRIG 151.0 (H) 07/14/2018   CHOLHDL 7 07/14/2018  - He had side effects from statins including  Lescol XL- joint pains.  At today's visit, I advised him to start Zetia.  We will check his lipid panel at next visit.  3.  Obesity class III -Lost 6 pounds since last visit despite the holidays, but gained 5 lbs back -Continue Invokana and Ozempic, which should also help with weight loss  Philemon Kingdom, MD PhD [facility]  Endocrinology

## 2018-11-24 NOTE — Patient Instructions (Addendum)
Please continue: - Tresiba U200 64 units daily - Metformin 1000 mg 2x a day with meals. - Invokana 100 mg daily in a.m. - Ozempic 1 mg weekly  Start Zetia 10 mg daily.  Please return in 3-4 months with your sugar log.

## 2018-11-24 NOTE — Addendum Note (Signed)
Addended by: Cardell Peach I on: 11/24/2018 09:19 AM   Modules accepted: Orders

## 2019-01-19 ENCOUNTER — Other Ambulatory Visit: Payer: Self-pay | Admitting: Internal Medicine

## 2019-03-24 ENCOUNTER — Other Ambulatory Visit: Payer: Self-pay

## 2019-03-26 ENCOUNTER — Ambulatory Visit: Payer: 59 | Admitting: Internal Medicine

## 2019-03-26 ENCOUNTER — Other Ambulatory Visit: Payer: Self-pay

## 2019-03-26 ENCOUNTER — Encounter: Payer: Self-pay | Admitting: Internal Medicine

## 2019-03-26 VITALS — BP 130/80 | HR 94 | Ht 73.0 in | Wt 285.0 lb

## 2019-03-26 DIAGNOSIS — E785 Hyperlipidemia, unspecified: Secondary | ICD-10-CM

## 2019-03-26 DIAGNOSIS — Z794 Long term (current) use of insulin: Secondary | ICD-10-CM | POA: Diagnosis not present

## 2019-03-26 DIAGNOSIS — E1140 Type 2 diabetes mellitus with diabetic neuropathy, unspecified: Secondary | ICD-10-CM

## 2019-03-26 LAB — POCT GLYCOSYLATED HEMOGLOBIN (HGB A1C): Hemoglobin A1C: 6.5 % — AB (ref 4.0–5.6)

## 2019-03-26 LAB — MICROALBUMIN / CREATININE URINE RATIO
Creatinine,U: 155.4 mg/dL
Microalb Creat Ratio: 0.6 mg/g (ref 0.0–30.0)
Microalb, Ur: 0.9 mg/dL (ref 0.0–1.9)

## 2019-03-26 LAB — LIPID PANEL
Cholesterol: 199 mg/dL (ref 0–200)
HDL: 34.8 mg/dL — ABNORMAL LOW (ref >39.00)
LDL Cholesterol: 140 mg/dL — ABNORMAL HIGH (ref 0–99)
NonHDL: 164.5
Total CHOL/HDL Ratio: 6
Triglycerides: 124 mg/dL (ref 0.0–149.0)
VLDL: 24.8 mg/dL (ref 0.0–40.0)

## 2019-03-26 NOTE — Patient Instructions (Addendum)
Please continue:  - Metformin 1000 mg 2x a day with meals. - Invokana 100 mg daily in a.m. - Ozempic 1 mg weekly  Please decrease: - Tresiba to 30 units daily  Continue Zetia 10 mg daily.  Please return in 3-4 months with your sugar log.

## 2019-03-26 NOTE — Progress Notes (Signed)
Patient ID: Jerry Eaton, male   DOB: [birth year], 48 y.o.   MRN: [id number]   HPI: Jerry Eaton is a 48 y.o.-year-old male, returning for follow-up for DM2, dx in ~2013, insulin-dependent since 2016-2017, uncontrolled, with complications (PN). Last visit 3 months ago.  Last hemoglobin A1c was: Lab Results  Component Value Date   HGBA1C 7.2 (A) 11/24/2018   HGBA1C 7.2 (A) 07/14/2018   HGBA1C 7.7 (H) 03/06/2018   Pt was on a regimen of: - Metformin 500 mg 1x a day, with meals (Rx'ed BID, forgets second dose) - Lantus 65 units at bedtime  Tried JanuMet >> transaminitis.  Now on: - Tresiba U200 64 >> 40 units daily - Metformin 1000 mg 2x a day with meals. - Invokana 100 mg daily in a.m. - Ozempic 1 mg weekly - added 02/2018 - has some constipation with this  He checks sugars 1-2x a day: - am:  120-170 >> 156-198, 232, 236 >> 105-161, 180, 202 - 2h after b'fast:190 x1 >> n/c >> 180-277 >> n/c >> 223 - before lunch:  150 >> n/c >> 130-190 >> n/c >> 122-154 - 2h after lunch:  180-202 >> 160-180 >> n/c >> 124, 202 - before dinner: 90-190 >> 94, 111-153 >> n/c >> 191 - 2h after dinner: 188-225 >> 150-250 >> n/c  - bedtime: n/c - nighttime: n/c He has hypoglycemia awareness in the 70s. Lowest: 150 >> 105 Highest: 200 >> 223  Glucometer: OneTouch  Verio IQ; freestyle libre CGM  Pt's meals are: - Breakfast: bisquits - Lunch: sandwich, burgers (may skip) - Dinner: burgers, chicken, steak - Snacks: 0-1  Is traveling a lot for work.  -No CKD, last BUN/creatinine:  Lab Results  Component Value Date   BUN 15 03/06/2018   BUN 12 07/01/2017   CREATININE 0.76 03/06/2018   CREATININE 0.79 07/01/2017   ACR normal: Lab Results  Component Value Date   MICRALBCREAT 4.6 07/14/2018   MICRALBCREAT 4.1 02/27/2017   MICRALBCREAT 0.6 09/19/2015   MICRALBCREAT 0.7 07/26/2014   On lisinopril 2.5.  -+ HL.  Last set of lipids: Lab Results  Component Value Date   CHOL 211 (H)  07/14/2018   HDL 32.40 (L) 07/14/2018   LDLCALC 148 (H) 07/14/2018   LDLDIRECT 163.5 05/16/2012   TRIG 151.0 (H) 07/14/2018   CHOLHDL 7 07/14/2018  He tried Crestor and Lipitor but these caused joint and muscle pain.  Co-Q10 was not helping.  We tried Lescol XL in 06/2017 but he still had side effects from this (shoulder pain).  At last visit I suggested Zetia 10.  -  last eye exam was: 09/2018: No DR  - + numbness and tingling in feet - improved  He travels to the [place] once a year.  ROS: Constitutional: no weight gain/no weight loss, no fatigue, no subjective hyperthermia, no subjective hypothermia Eyes: no blurry vision, no xerophthalmia ENT: no sore throat, no nodules palpated in neck, no dysphagia, no odynophagia, no hoarseness Cardiovascular: no CP/no SOB/no palpitations/no leg swelling Respiratory: no cough/no SOB/no wheezing Gastrointestinal: no N/no V/no D/no C/no acid reflux Musculoskeletal: no muscle aches/no joint aches Skin: no rashes, no hair loss Neurological: no tremors/no numbness/no tingling/no dizziness  I reviewed pt's medications, allergies, PMH, social hx, family hx, and changes were documented in the history of present illness. Otherwise, unchanged from my initial visit note.  Past Medical History:  Diagnosis Date  . Carpal tunnel syndrome   . Diabetes mellitus   . Family history  of polyps in the colon   . Fatty liver   . GERD (gastroesophageal reflux disease)   . History of chicken pox   . Hyperlipidemia    no meds now 05-28-16  . Obesity   . Sleep apnea    not using CPAP at this time 05-28-16   Past Surgical History:  Procedure Laterality Date  . COLONOSCOPY     2012, 2017  . LUMBAR DISC SURGERY     L4/L5  . PILONIDAL CYST EXCISION     2001   Social History   Social History  . Marital status: Single    Spouse name: N/A  . Number of children: 0   Social History Main Topics  . Smoking status: Current Every Day Smoker    Packs/day:  1.00    Years: 20.00    Types: Cigarettes  . Smokeless tobacco: Never Used  . Alcohol use Yes     Comment: 10-15 per week, usually on the weekends  . 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 canagliflozin (INVOKANA) 100 MG TABS tablet take 1 tablet by mouth daily before breakfast 90 tablet 3  . Continuous Blood Gluc Sensor (FREESTYLE LIBRE 14 DAY SENSOR) MISC 1 each by Does not apply route every 14 (fourteen) days. Change every 2 weeks 2 each 11  . ezetimibe (ZETIA) 10 MG tablet Take 1 tablet (10 mg total) by mouth daily. 30 tablet 11  . glucose blood test strip Check sugar before and 2 hours after meals. Okay to check >3 times per day- insulin treated, uncontrolled DM2. 300 each 1  . ibuprofen (ADVIL,MOTRIN) 200 MG tablet Take 400-600 mg by mouth 3 (three) times daily as needed. With food.      . Insulin Degludec (TRESIBA FLEXTOUCH) 200 UNIT/ML SOPN Inject 64 Units into the skin daily. 9 pen 3  . Lancets (ONETOUCH ULTRASOFT) lancets Check sugar before and 2 hours after meals. Okay to check >3 times per day- insulin treated, uncontrolled DM2. 300 each 1  . lisinopril (PRINIVIL,ZESTRIL) 2.5 MG tablet TAKE ONE TABLET BY MOUTH ONE TIME DAILY  90 tablet 2  . metFORMIN (GLUCOPHAGE) 1000 MG tablet Take 1 tablet (1,000 mg total) by mouth 2 (two) times daily with a meal.    . ranitidine (ZANTAC) 150 MG tablet Take 150 mg by mouth as needed.      . Semaglutide, 1 MG/DOSE, (OZEMPIC, 1 MG/DOSE,) 2 MG/1.5ML SOPN Inject 1 mg into the skin once a week. 6 pen 3  . traMADol (ULTRAM) 50 MG tablet Take 1 tablet (50 mg total) by mouth every 8 (eight) hours as needed (for pain). 15 tablet 0  . UNIFINE PENTIPS PLUS 32G X 4 MM MISC USE 1 PENTIP ONCE A DAY 100 each 2   Current Facility-Administered Medications on File Prior to Visit   Medication Dose Route Frequency Provider Last Rate Last Dose  . 0.9 %  sodium chloride infusion  500 mL Intravenous Continuous Irene Shipper, MD       Allergies  Allergen Reactions  . Other Anaphylaxis    Bee stings  . Chantix [Varenicline Tartrate]     irritable  . Crestor [Rosuvastatin Calcium]     myalgia  . Januvia [Sitagliptin [place] in LFTs. Occurred with janumet,  but prev tolerated metformin alone  . Lipitor [Atorvastatin Calcium]     myalgia   Family History  Problem Relation Age of Onset  . Diabetes Mother   . Diabetes Father   . Prostate cancer Father        dx'd in his early 35s  . Hyperlipidemia Brother   . Colon cancer Paternal Uncle        dx'd near age 72  . Diabetes Maternal Uncle   . Esophageal cancer Neg Hx   . Stomach cancer Neg Hx   . Rectal cancer Neg Hx   Pt has FH of DM in Mother, father, M uncle.   PE: BP 130/80   Pulse 94   Ht 6\' 1"  (1.854 m)   Wt 285 lb (129.3 kg)   SpO2 97%   BMI 37.60 kg/m  Wt Readings from Last 3 Encounters:  03/26/19 285 lb (129.3 kg)  11/24/18 (!) 303 lb (137.4 kg)  10/27/18 298 lb 4 oz (135.3 kg)   Constitutional: overweight, in NAD Eyes: PERRLA, EOMI, no exophthalmos ENT: moist mucous membranes, no thyromegaly, no cervical lymphadenopathy Cardiovascular: tachycardia, RR, No MRG Respiratory: CTA B Gastrointestinal: abdomen soft, NT, ND, BS+ Musculoskeletal: no deformities, strength intact in all 4 Skin: moist, warm, no rashes Neurological: no tremor with outstretched hands, DTR normal in all 4  ASSESSMENT: 1. DM2, insulin-dependent, uncontrolled, with complications - PN  No personal history of pancreatitis or family history of medullary thyroid cancer  2. HL  3.  Obesity class III  PLAN:  1. Patient with longstanding, uncontrolled, type 2 diabetes, on metformin, SGLT 2 inhibitor, basal insulin, and weekly GLP-1 receptor agonist.  His sugars improved after starting Ozempic. -At last visit,  sugars were still high, well, above target, but he was only checking them in the morning.  I strongly advised him to start checking them later in the day, also.  He did have a freestyle libre CGM in the past but this kept coming off so he is not using this.  He also mentioned at last visit that he had missed medication doses over the holidays.  He did not want to change his regimen at that time but to start working on his diet and taking his medicines consistently. - at this visit, sugars are better, with occasional hyperglycemic spikes in the 180-200. He is now only taking her insulin when sugars are higher at night >> discussed to use this every day, but reduce the dose. - I suggested to:  Patient Instructions  Please continue:  - Metformin 1000 mg 2x a day with meals. - Invokana 100 mg daily in a.m. - Ozempic 1 mg weekly  Please decrease: - Tresiba to 30 units daily  Continue Zetia 10 mg daily.  Please return in 3-4 months with your sugar log.   - today, HbA1c is 6.5% (much better) - continue checking sugars at different times of the day - check 3x a day, rotating checks - advised for yearly eye exams >> he is UTD - Return to clinic in 3-4 mo with sugar log      2. HL - Reviewed latest lipid panel from 06/2018: High LDL, low HDL, triglycerides close to goal Lab Results  Component Value Date   CHOL 211 (H) 07/14/2018   HDL 32.40 (L) 07/14/2018   LDLCALC 148 (H) 07/14/2018   LDLDIRECT 163.5 05/16/2012   TRIG 151.0 (H) 07/14/2018   CHOLHDL 7 07/14/2018  -He had joint pains from statins, including Lescol  XL.  At last visit I suggested Zetia.  He did start this. - check lipids today  3.  Obesity class III -Continue Invokana and Ozempic, which should also help with wt loss - he lost almost 20 lbs since last OV!  Component     Latest Ref Rng & Units 03/26/2019  Glucose     65 - 99 mg/dL 105 (H)  BUN     7 - 25 mg/dL 18  Creatinine     0.60 - 1.35 mg/dL 0.79  GFR, Est Non  African American     > OR = 60 mL/min/1.72m2 108  GFR, Est African American     > OR = 60 mL/min/1.38m2 126  BUN/Creatinine Ratio     6 - 22 (calc) NOT APPLICABLE  Sodium     759 - 146 mmol/L 137  Potassium     3.5 - 5.3 mmol/L 4.7  Chloride     98 - 110 mmol/L 101  CO2     20 - 32 mmol/L 26  Calcium     8.6 - 10.3 mg/dL 9.8  Total Protein     6.1 - 8.1 g/dL 7.2  Albumin MSPROF     3.6 - 5.1 g/dL 4.3  Globulin     1.9 - 3.7 g/dL (calc) 2.9  AG Ratio     1.0 - 2.5 (calc) 1.5  Total Bilirubin     0.2 - 1.2 mg/dL 0.8  Alkaline phosphatase (APISO)     36 - 130 U/L 61  AST     10 - 40 U/L 19  ALT     9 - 46 U/L 21  Cholesterol     0 - 200 mg/dL 199  Triglycerides     0.0 - 149.0 mg/dL 124.0  HDL Cholesterol     >39.00 mg/dL 34.80 (L)  VLDL     0.0 - 40.0 mg/dL 24.8  LDL (calc)     0 - 99 mg/dL 140 (H)  Total CHOL/HDL Ratio      6  NonHDL      164.50  Microalb, Ur     0.0 - 1.9 mg/dL 0.9  Creatinine,U     mg/dL 155.4  MICROALB/CREAT RATIO     0.0 - 30.0 mg/g 0.6  Hemoglobin A1C     4.0 - 5.6 % 6.5 (A)   Lipid panel did not change much on Zetia.  We will continue with weight loss goal improve his LDL.  If not, may need a PCSK9 inhibitor.  Philemon Kingdom, MD PhD [facility] Endocrinology

## 2019-03-27 LAB — COMPLETE METABOLIC PANEL WITH GFR
AG Ratio: 1.5 (calc) (ref 1.0–2.5)
ALT: 21 U/L (ref 9–46)
AST: 19 U/L (ref 10–40)
Albumin: 4.3 g/dL (ref 3.6–5.1)
Alkaline phosphatase (APISO): 61 U/L (ref 36–130)
BUN: 18 mg/dL (ref 7–25)
CO2: 26 mmol/L (ref 20–32)
Calcium: 9.8 mg/dL (ref 8.6–10.3)
Chloride: 101 mmol/L (ref 98–110)
Creat: 0.79 mg/dL (ref 0.60–1.35)
GFR, Est African American: 126 mL/min/{1.73_m2} (ref >=60)
GFR, Est Non African American: 108 mL/min/{1.73_m2} (ref >=60)
Globulin: 2.9 g/dL (calc) (ref 1.9–3.7)
Glucose, Bld: 105 mg/dL — ABNORMAL HIGH (ref 65–99)
Potassium: 4.7 mmol/L (ref 3.5–5.3)
Sodium: 137 mmol/L (ref 135–146)
Total Bilirubin: 0.8 mg/dL (ref 0.2–1.2)
Total Protein: 7.2 g/dL (ref 6.1–8.1)

## 2019-04-01 ENCOUNTER — Telehealth: Payer: Self-pay

## 2019-04-01 NOTE — Telephone Encounter (Signed)
Left message for patient to return our call at [phone].  

## 2019-04-01 NOTE — Telephone Encounter (Signed)
-----   Message from Philemon Kingdom, MD sent at 03/27/2019  4:15 PM EDT ----- Lenna Sciara, can you please call pt: Lipid panel did not change much on Zetia, but I would want him to continue and also to continue with weight loss.  If not improving at next check, may need to add another medication.

## 2019-04-02 NOTE — Telephone Encounter (Signed)
Notified patient of message from Dr. Gherghe, patient expressed understanding and agreement. No further questions.  

## 2019-05-25 ENCOUNTER — Encounter: Payer: Self-pay | Admitting: Internal Medicine

## 2019-06-18 ENCOUNTER — Other Ambulatory Visit: Payer: Self-pay | Admitting: Internal Medicine

## 2019-07-09 ENCOUNTER — Ambulatory Visit: Payer: 59 | Admitting: Internal Medicine

## 2019-07-17 ENCOUNTER — Ambulatory Visit: Payer: 59 | Admitting: Family Medicine

## 2019-07-17 ENCOUNTER — Encounter (INDEPENDENT_AMBULATORY_CARE_PROVIDER_SITE_OTHER): Payer: Self-pay

## 2019-07-17 ENCOUNTER — Encounter: Payer: Self-pay | Admitting: Family Medicine

## 2019-07-17 ENCOUNTER — Other Ambulatory Visit: Payer: Self-pay

## 2019-07-17 ENCOUNTER — Telehealth: Payer: Self-pay | Admitting: *Deleted

## 2019-07-17 ENCOUNTER — Ambulatory Visit
Admission: RE | Admit: 2019-07-17 | Discharge: 2019-07-17 | Disposition: A | Discharge status: Home / Self Care | Payer: 59 | Source: Ambulatory Visit | Attending: Family Medicine | Admitting: Family Medicine

## 2019-07-17 VITALS — BP 122/76 | HR 95 | Temp 97.3°F | Ht 73.0 in | Wt 293.0 lb

## 2019-07-17 DIAGNOSIS — Z23 Encounter for immunization: Secondary | ICD-10-CM

## 2019-07-17 DIAGNOSIS — M79604 Pain in right leg: Secondary | ICD-10-CM | POA: Insufficient documentation

## 2019-07-17 DIAGNOSIS — I824Z9 Acute embolism and thrombosis of unspecified deep veins of unspecified distal lower extremity: Secondary | ICD-10-CM

## 2019-07-17 MED ORDER — TRESIBA FLEXTOUCH 200 UNIT/ML ~~LOC~~ SOPN: 40.0000 [IU] | PEN_INJECTOR | Freq: Every day | SUBCUTANEOUS | Status: DC

## 2019-07-17 MED ORDER — RIVAROXABAN 20 MG PO TABS: 20.0000 mg | ORAL_TABLET | Freq: Every day | ORAL | 4 refills | Status: DC

## 2019-07-17 MED ORDER — RIVAROXABAN (XARELTO) VTE STARTER PACK (15 & 20 MG): ORAL_TABLET | ORAL | 0 refills | Status: DC

## 2019-07-17 NOTE — Telephone Encounter (Signed)
Ordered u/s to eval for DVT.  Please see about getting it done in the meantime then add onto my schedule at 4pm.

## 2019-07-17 NOTE — Telephone Encounter (Signed)
Noted. Thanks.

## 2019-07-17 NOTE — Progress Notes (Signed)
He called in today with triage note for leg swelling.  Sent for ultrasound.  U/s positive for DVT.  D/w pt.  No h/o DVT prior.  R calf pain after a long car drive recently.  No L leg sx.  No trauma.  No CP, not SOB.  Smoking 1 PPD.    No known FH DVT or PE, but patient was unsure about his father's hx.   Penile lesion, not resolving.  Not tender.  No ulcer.  No burning with urination.  Single partner.    Meds, vitals, and allergies reviewed.   ROS: Per HPI unless specifically indicated in ROS section   nad ncat rrr ctab Abd soft, not ttp Right calf 42 cm circumference and tender to palpation. Left calf 40 cm.  Not tender. Benign appearing 79mm white lesion on the L side of penile shaft.  No ulceration.  No discharge.

## 2019-07-17 NOTE — Patient Instructions (Addendum)
Go to the lab on the way out.  We'll contact you with your lab report. Start with the start pack for xarelto.  15mg  twice a day then transition to 20mg  a day.  Plan on 6 months of treatment.   Let me know if I can help stop smoking.    Recheck in 3 months, sooner if needed.   If any bleeding, then update [place].  Stop ibuprofen.  Don't take extra aspirin.   Check to make sure if any family members have had blood clots.  Take care.  Glad to see you.

## 2019-07-17 NOTE — Telephone Encounter (Signed)
Patient scheduled at [facility] today at 2:30pm and will come here for a 4:00pm office visit

## 2019-07-17 NOTE — Telephone Encounter (Signed)
Patient called stating that he is having pain in his right calf below the knee. Patient stated that it is warm to the touch and swollen. Patient stated that he took a long car trip Sunday and he thinks the symptoms started Monday night or Tuesday. Advised patient that Dr. Damita Dunnings is aware and is working on trying to get something lined up for him to go for test today.

## 2019-07-18 LAB — CBC WITH DIFFERENTIAL/PLATELET
Absolute Monocytes: 743 cells/uL (ref 200–950)
Basophils Absolute: 63 cells/uL (ref 0–200)
Basophils Relative: 0.8 %
Eosinophils Absolute: 158 cells/uL (ref 15–500)
Eosinophils Relative: 2 %
HCT: 45.2 % (ref 38.5–50.0)
Hemoglobin: 15.3 g/dL (ref 13.2–17.1)
Lymphs Abs: 2094 cells/uL (ref 850–3900)
MCH: 27.9 pg (ref 27.0–33.0)
MCHC: 33.8 g/dL (ref 32.0–36.0)
MCV: 82.3 fL (ref 80.0–100.0)
MPV: 10.3 fL (ref 7.5–12.5)
Monocytes Relative: 9.4 %
Neutro Abs: 4843 cells/uL (ref 1500–7800)
Neutrophils Relative %: 61.3 %
Platelets: 328 10*3/uL (ref 140–400)
RBC: 5.49 10*6/uL (ref 4.20–5.80)
RDW: 13.5 % (ref 11.0–15.0)
Total Lymphocyte: 26.5 %
WBC: 7.9 10*3/uL (ref 3.8–10.8)

## 2019-07-18 LAB — COMPREHENSIVE METABOLIC PANEL
AG Ratio: 1.5 (calc) (ref 1.0–2.5)
ALT: 15 U/L (ref 9–46)
AST: 15 U/L (ref 10–40)
Albumin: 4.3 g/dL (ref 3.6–5.1)
Alkaline phosphatase (APISO): 66 U/L (ref 36–130)
BUN: 20 mg/dL (ref 7–25)
CO2: 26 mmol/L (ref 20–32)
Calcium: 10.1 mg/dL (ref 8.6–10.3)
Chloride: 103 mmol/L (ref 98–110)
Creat: 0.81 mg/dL (ref 0.60–1.35)
Globulin: 2.8 g/dL (calc) (ref 1.9–3.7)
Glucose, Bld: 142 mg/dL — ABNORMAL HIGH (ref 65–99)
Potassium: 4.4 mmol/L (ref 3.5–5.3)
Sodium: 139 mmol/L (ref 135–146)
Total Bilirubin: 0.4 mg/dL (ref 0.2–1.2)
Total Protein: 7.1 g/dL (ref 6.1–8.1)

## 2019-07-19 DIAGNOSIS — I824Z9 Acute embolism and thrombosis of unspecified deep veins of unspecified distal lower extremity: Secondary | ICD-10-CM | POA: Insufficient documentation

## 2019-07-19 NOTE — Assessment & Plan Note (Addendum)
Discussed ultrasound, need for treatment.  Would start Xarelto with starter pack and then transition to once daily dosing.  Routine cautions given.  Check routine labs today.  Update me as needed.  No known bleeding otherwise and it would be reasonable to proceed with anticoagulation at this point.  Discussed.  All questions answered.  I presume that a combination of smoking, obesity, prolonged car trip may have contributed to his current situation.  Encouraged smoking cessation.  He can check about his family history in terms of his father's history.  He can update me as needed.  Discussed 3 to 6 months worth of treatment.  I would plan on at least 3 months and have him recheck in 3 months.  We can discuss further options at that point.  I do not see a reason at this point for lifelong anticoagulation.  Discussed.  See after visit summary.  >25 minutes spent in face to face time with patient, >50% spent in counselling or coordination of care.

## 2019-07-25 ENCOUNTER — Other Ambulatory Visit: Payer: Self-pay | Admitting: Internal Medicine

## 2019-07-25 ENCOUNTER — Other Ambulatory Visit: Payer: Self-pay | Admitting: Family Medicine

## 2019-07-25 DIAGNOSIS — E1140 Type 2 diabetes mellitus with diabetic neuropathy, unspecified: Secondary | ICD-10-CM

## 2019-07-25 DIAGNOSIS — Z794 Long term (current) use of insulin: Secondary | ICD-10-CM

## 2019-08-21 ENCOUNTER — Other Ambulatory Visit: Payer: Self-pay

## 2019-08-21 ENCOUNTER — Ambulatory Visit: Payer: 59 | Admitting: Internal Medicine

## 2019-08-21 ENCOUNTER — Encounter: Payer: Self-pay | Admitting: Internal Medicine

## 2019-08-21 ENCOUNTER — Telehealth: Payer: Self-pay | Admitting: General Surgery

## 2019-08-21 VITALS — BP 140/80 | HR 105 | Ht 73.0 in | Wt 286.2 lb

## 2019-08-21 VITALS — BP 110/70 | HR 92 | Temp 97.9°F | Ht 71.5 in | Wt 285.4 lb

## 2019-08-21 DIAGNOSIS — E1140 Type 2 diabetes mellitus with diabetic neuropathy, unspecified: Secondary | ICD-10-CM | POA: Diagnosis not present

## 2019-08-21 DIAGNOSIS — Z1159 Encounter for screening for other viral diseases: Secondary | ICD-10-CM

## 2019-08-21 DIAGNOSIS — Z7901 Long term (current) use of anticoagulants: Secondary | ICD-10-CM

## 2019-08-21 DIAGNOSIS — Z86010 Personal history of colonic polyps: Secondary | ICD-10-CM | POA: Diagnosis not present

## 2019-08-21 DIAGNOSIS — E119 Type 2 diabetes mellitus without complications: Secondary | ICD-10-CM

## 2019-08-21 DIAGNOSIS — Z794 Long term (current) use of insulin: Secondary | ICD-10-CM | POA: Diagnosis not present

## 2019-08-21 LAB — POCT GLYCOSYLATED HEMOGLOBIN (HGB A1C): Hemoglobin A1C: 6.2 % — AB (ref 4.0–5.6)

## 2019-08-21 MED ORDER — NA SULFATE-K SULFATE-MG SULF 17.5-3.13-1.6 GM/177ML PO SOLN: 1.0000 | Freq: Once | ORAL | 0 refills | Status: AC

## 2019-08-21 NOTE — Progress Notes (Signed)
Patient ID: Jerry Eaton, male   DOB: Sep 01, 1971, 48 y.o.   MRN: [id number]   HPI: Jerry Eaton is a 48 y.o.-year-old male, returning for follow-up for DM2, dx in ~2013, insulin-dependent since 2016-2017, uncontrolled, with complications (PN). Last visit 4 months ago.  He had a DVT recently after a long drive >> on Xarelto.  He restarted traveling for work-in states close by, not flying.  Last hemoglobin A1c was: Lab Results  Component Value Date   HGBA1C 6.2 (A) 08/21/2019   HGBA1C 6.5 (A) 03/26/2019   HGBA1C 7.2 (A) 11/24/2018   Pt was on a regimen of: - Metformin 500 mg 1x a day, with meals (Rx'ed BID, forgets second dose) - Lantus 65 units at bedtime  Tried JanuMet >> transaminitis.  Now on: - Metformin 1000 mg 2x a day with meals. - Invokana 100 mg daily in a.m. - Ozempic 1 mg weekly - Tresiba 64 >> 40 units daily  He checks sugars 1-2 times a day per review of his meter download: - am:  156-198, 232, 236 >> 105-161, 180, 202 >> 118-137, 172 - 2h after b'fast:190 x1 >> n/c >> 180-277 >> n/c >> 223 >> n/c - before lunch:  150 >> n/c >> 130-190 >> n/c >> 122-154 >> n/c - 2h after lunch:  180-202 >> 160-180 >> n/c >> 124, 202 >> n/c - before dinner: 90-190 >> 94, 111-153 >> n/c >> 191 >> n/c - 2h after dinner: 188-225 >> 150-250 >> n/c  >> 105, 126 - bedtime: n/c - nighttime: n/c He has hypoglycemia awareness in the 70s. Lowest: 150 >> 105 >> 105 Highest: 200 >> 223 >> 172  Glucometer: OneTouch  Verio IQ; freestyle libre CGM  Pt's meals are: - Breakfast: bisquits - Lunch: sandwich, burgers (may skip) - Dinner: burgers, chicken, steak - Snacks: 0-1  Is traveling a lot for work.  -No CKD, last BUN/creatinine:  Lab Results  Component Value Date   BUN 20 07/17/2019   BUN 18 03/26/2019   CREATININE 0.81 07/17/2019   CREATININE 0.79 03/26/2019   ACR normal: Lab Results  Component Value Date   MICRALBCREAT 0.6 03/26/2019   MICRALBCREAT 4.6 07/14/2018    MICRALBCREAT 4.1 02/27/2017   MICRALBCREAT 0.6 09/19/2015   MICRALBCREAT 0.7 07/26/2014   On lisinopril 2.5.  -+ HL.  Last set of lipids: Lab Results  Component Value Date   CHOL 199 03/26/2019   HDL 34.80 (L) 03/26/2019   LDLCALC 140 (H) 03/26/2019   LDLDIRECT 163.5 05/16/2012   TRIG 124.0 03/26/2019   CHOLHDL 6 03/26/2019  He tried Crestor and Lipitor but these caused joint and muscle pain.  Co-Q10 was not helping.  We tried Lescol XL in 06/2017 but he still had side effects from this (shoulder pain).  He is currently on Zetia 10.  -  last eye exam was: 09/2018: No DR  -He has numbness and tingling in feet-improved  He was traveling to the [place] once a year to visit his girlfriend before the coronavirus pandemic. Now he married his girlfriend and she wil move to [place] in 09/2019.  ROS: Constitutional: no weight gain/no weight loss, no fatigue, no subjective hyperthermia, no subjective hypothermia Eyes: no blurry vision, no xerophthalmia ENT: no sore throat, no nodules palpated in neck, no dysphagia, no odynophagia, no hoarseness Cardiovascular: no CP/no SOB/no palpitations/no leg swelling Respiratory: no cough/no SOB/no wheezing Gastrointestinal: no N/no V/no D/no C/no acid reflux Musculoskeletal: no muscle aches/no joint aches Skin: no rashes,  no hair loss Neurological: no tremors/no numbness/no tingling/no dizziness  I reviewed pt's medications, allergies, PMH, social hx, family hx, and changes were documented in the history of present illness. Otherwise, unchanged from my initial visit note.  Past Medical History:  Diagnosis Date  . Carpal tunnel syndrome   . Diabetes mellitus   . Family history of polyps in the colon   . Fatty liver   . GERD (gastroesophageal reflux disease)   . History of chicken pox   . Hyperlipidemia    no meds now 05-28-16  . Obesity   . Sleep apnea    not using CPAP at this time 05-28-16   Past Surgical History:  Procedure Laterality Date   . COLONOSCOPY     2012, 2017  . LUMBAR DISC SURGERY     L4/L5  . PILONIDAL CYST EXCISION     2001   Social History   Social History  . Marital status: Single    Spouse name: N/A  . Number of children: 0   Social History Main Topics  . Smoking status: Current Every Day Smoker    Packs/day: 1.00    Years: 20.00    Types: Cigarettes  . Smokeless tobacco: Never Used  . Alcohol use Yes     Comment: 10-15 per week, usually on the weekends  . 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 canagliflozin (INVOKANA) 100 MG TABS tablet take 1 tablet by mouth daily before breakfast 90 tablet 3  . Continuous Blood Gluc Sensor (FREESTYLE LIBRE 14 DAY SENSOR) MISC 1 each by Does not apply route every 14 (fourteen) days. Change every 2 weeks 2 each 11  . ezetimibe (ZETIA) 10 MG tablet Take 1 tablet (10 mg total) by mouth daily. 30 tablet 11  . glucose blood test strip Check sugar before and 2 hours after meals. Okay to check >3 times per day- insulin treated, uncontrolled DM2. 300 each 1  . Insulin Degludec (TRESIBA FLEXTOUCH) 200 UNIT/ML SOPN Inject 40 Units into the skin daily.    . Lancets (ONETOUCH ULTRASOFT) lancets Check sugar before and 2 hours after meals. Okay to check >3 times per day- insulin treated, uncontrolled DM2. 300 each 1  . lisinopril (ZESTRIL) 2.5 MG tablet take 1 tablet by mouth once daily 90 tablet 0  . metFORMIN (GLUCOPHAGE) 1000 MG tablet TAKE ONE TABLET BY MOUTH TWICE DAILY WITH MEALS 180 tablet 0  . ranitidine (ZANTAC) 150 MG tablet Take 150 mg by mouth as needed.      . rivaroxaban (XARELTO) 20 MG TABS tablet Take 1 tablet (20 mg total) by mouth daily with supper. Start after finishing starter pack 30 tablet 4  . Rivaroxaban 15 & 20 MG TBPK Follow package directions: Take one 15mg  tablet by mouth  twice a day. On day 22, switch to one 20mg  tablet once a day. Take with food. 51 each 0  . Semaglutide, 1 MG/DOSE, (OZEMPIC, 1 MG/DOSE,) 2 MG/1.5ML SOPN Inject 1 mg into the skin once a week. 6 pen 3  . traMADol (ULTRAM) 50 MG tablet Take 1 tablet (50 mg total) by mouth every 8 (eight) hours as needed (for pain). 15 tablet 0  . UNIFINE PENTIPS PLUS 32G X 4 MM MISC USE 1 PENTIP ONCE A DAY 100 each 2  No current facility-administered medications on file prior to visit.    Allergies  Allergen Reactions  . Other Anaphylaxis    Bee stings  . Chantix [Varenicline Tartrate]     irritable  . Crestor [Rosuvastatin Calcium]     myalgia  . Januvia [Sitagliptin [place] in LFTs. Occurred with janumet, but prev tolerated metformin alone  . Lipitor [Atorvastatin Calcium]     myalgia   Family History  Problem Relation Age of Onset  . Diabetes Mother   . Diabetes Father   . Prostate cancer Father        dx'd in his early 14s  . Hyperlipidemia Brother   . Colon cancer Paternal Uncle        dx'd near age 59  . Diabetes Maternal Uncle   . Esophageal cancer Neg Hx   . Stomach cancer Neg Hx   . Rectal cancer Neg Hx   Pt has FH of DM in Mother, father, M uncle.   PE: BP 140/80 (BP Location: Right Arm, Patient Position: Sitting, Cuff Size: Large)   Pulse (!) 105   Ht 6\' 1"  (1.854 m)   Wt 286 lb 3.2 oz (129.8 kg)   SpO2 98%   BMI 37.76 kg/m  Wt Readings from Last 3 Encounters:  08/21/19 286 lb 3.2 oz (129.8 kg)  07/17/19 293 lb (132.9 kg)  03/26/19 285 lb (129.3 kg)   Constitutional: overweight, in NAD Eyes: PERRLA, EOMI, no exophthalmos ENT: moist mucous membranes, no thyromegaly, no cervical lymphadenopathy Cardiovascular: tachycardia, RR, No MRG Respiratory: CTA B Gastrointestinal: abdomen soft, NT, ND, BS+ Musculoskeletal: no deformities, strength intact in all 4 Skin: moist, warm, no rashes Neurological: no tremor with outstretched hands, DTR normal in all  4  ASSESSMENT: 1. DM2, insulin-dependent, uncontrolled, with complications - PN  No personal history of pancreatitis or family history of medullary thyroid cancer  2. HL  3.  Obesity class III  PLAN:  1. Patient with  longstandin, previously uncontrolled, type 2 diabetes, on Metformin, SGLT2 inhibitor, basal insulin and weekly GLP-1 receptor agonist.  His sugars started to improve after we started Ozempic. -At last visit, we decreased his long-acting insulin dose since his sugars were better, with only occasional hyperglycemic spikes in the 180-200.  At that time, he was only taking his insulin when sugars are higher at night.  However, at this visit, he tells me that he forgot about the instructions to decrease the dose and he is still taking 20 units of [place] daily. -Of note, he was on a freestyle libre CGM in the past but this kept coming up so he is not using it.  At this visit, we reviewed his meter download.  Sugars remain mostly at goal in the morning, and he is not usually checking later in the day but whenever he checked, these were at goal. -For now, there is no need to change his regimen, but that next visit, if his sugars continue to improve, we will need to decrease [place]. - I suggested to:  Patient Instructions  Please continue:  - Metformin 1000 mg 2x a day with meals. - Invokana 100 mg daily in a.m. - Ozempic 1 mg weekly - Tresiba 40 units daily  Continue Zetia 10 mg daily.  Please return in 4 months with your sugar log.   - we checked his HbA1c: 6.2% (improved further) - advised to check sugars at different times of the day - 1-2x a day, rotating check  times - advised for yearly eye exams >> he is UTD and has an appointment scheduled in 09/2019 - UTD with flu shot - return to clinic in 4 months     2. HL -Reviewed latest lipid panel from 03/2019: LDL improved but not enough, still above goal.  HDL low Lab Results  Component Value Date   CHOL 199 03/26/2019    HDL 34.80 (L) 03/26/2019   LDLCALC 140 (H) 03/26/2019   LDLDIRECT 163.5 05/16/2012   TRIG 124.0 03/26/2019   CHOLHDL 6 03/26/2019  -He had joint pains from statins, including Lescol XL.  For the above lipid panel he was on Zetia.  We will continue this. -We will check another lipid panel at next visit  3.  Obesity class III -Continue Invokana and Ozempic which should also help with weight loss -He lost almost 20 pounds before last visit, but weight is stable since last visit  Philemon Kingdom, MD PhD [facility]

## 2019-08-21 NOTE — Progress Notes (Signed)
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  Most recent examination with 4 polyps (3 adenomas) removed and internal hemorrhoids.  Follow-up in 3 years recommended.  Patient presents today regarding his surveillance examination.  He is diabetic and obese.  Also diagnosed with DVT approximately 1 month ago (attributed to long ride in car) for which she is now on Xarelto.  He tells me that he is anticipating about 3 months of anticoagulation therapy.  His GI review of systems is negative except for occasional bouts of diarrhea.  Review of blood work from July 17, 2019 shows normal CBC with hemoglobin 15.3.  Review of x-ray file shows CT scan of the abdomen and pelvis June 2017 to evaluate generalized abdominal pain was negative for acute abdominal abnormalities.  He does have lumbar disc disease.  REVIEW OF SYSTEMS:  All non-GI ROS negative unless otherwise stated in the HPI except for sleep apnea  Past Medical History:  Diagnosis Date  . Carpal tunnel syndrome   . Diabetes mellitus   . DVT (deep venous thrombosis) ([place])   . Family history of polyps in the colon   . Fatty liver   . GERD (gastroesophageal reflux disease)   . History of chicken pox   . Hyperlipidemia    no meds now 05-28-16  . Obesity   . Sleep apnea    not using CPAP at this time 05-28-16    Past Surgical History:  Procedure Laterality Date  . COLONOSCOPY     2012, 2017  . LUMBAR DISC SURGERY     L4/L5  . PILONIDAL CYST EXCISION     2001    Social History HITOSHI BORSCH  reports that he has been smoking cigarettes. He has a 20.00 pack-year smoking history. He has never used smokeless tobacco. He reports current alcohol use. He reports that he does not use drugs.  family history includes Colon cancer in his paternal uncle; Diabetes in his father,  maternal uncle, and mother; Hyperlipidemia in his brother; Prostate cancer in his father; Stroke in his mother.  Allergies  Allergen Reactions  . Other Anaphylaxis    Bee stings  . Chantix [Varenicline Tartrate]     irritable  . Crestor [Rosuvastatin Calcium]     myalgia  . Januvia [Sitagliptin [place] in LFTs. Occurred with janumet, but prev tolerated metformin alone  . Lipitor [Atorvastatin Calcium]     myalgia       PHYSICAL EXAMINATION: Vital signs: BP 110/70 (BP Location: Left Arm, Patient Position: Sitting, Cuff Size: Normal)   Pulse 92   Temp 97.9 F (36.6 C)   Ht 5' 11.5" (1.816 m) Comment: height measured without shoes  Wt 285 lb 6 oz (129.4 kg)   BMI 39.25 kg/m   Constitutional: generally well-appearing, no acute distress Psychiatric: alert and oriented x3, cooperative Eyes: extraocular movements intact, anicteric, conjunctiva pink Mouth: oral pharynx moist, no lesions Neck: supple no lymphadenopathy Cardiovascular: heart regular rate and rhythm, no murmur Lungs: clear to auscultation bilaterally Abdomen: soft, nontender, nondistended, no obvious ascites, no peritoneal signs, normal bowel sounds, no organomegaly Rectal: Deferred until colonoscopy Extremities: no clubbing, cyanosis, or lower extremity edema bilaterally Skin: no lesions on visible extremities Neuro: No focal deficits.  Cranial nerves intact  ASSESSMENT:  1.  History of multiple adenomatous colon  polyps.  Due for surveillance 2.  Recent problems for DVT for which she is now on Xarelto.  Anticipating a total of 3 months of treatment 3.  Diabetes mellitus, sleep apnea and obesity   PLAN:  1.  Schedule surveillance colonoscopy.  The patient is HIGH RISK given his comorbidities and chronic anticoagulation status.  I recommended that we schedule his colonoscopy in December so that he is 3 months out from his DVT.  We can hold his Xarelto 1 day prior to the procedure with the expectation  to resume therapy that day.  We will also need to hold his diabetic medications the day of the examination order to avoid unwanted hypoglycemia.  Due to his comorbidities he will need monitored anesthesia care for his sedation.The nature of the procedure, as well as the risks, benefits, and alternatives were carefully and thoroughly reviewed with the patient. Ample time for discussion and questions allowed. The patient understood, was satisfied, and agreed to proceed.

## 2019-08-21 NOTE — Patient Instructions (Addendum)
Please continue:  - Metformin 1000 mg 2x a day with meals. - Invokana 100 mg daily in a.m. - Ozempic 1 mg weekly - Tresiba 40 units daily  Continue Zetia 10 mg daily.  Please return in 3-4 months with your sugar log.

## 2019-08-21 NOTE — Telephone Encounter (Signed)
The patient specifically requested this date due to his schedule.

## 2019-08-21 NOTE — Patient Instructions (Signed)
If you are age 46 or older, your body mass index should be between 23-30. Your Body mass index is 39.25 kg/m. If this is out of the aforementioned range listed, please consider follow up with your Primary Care Provider.  If you are age 67 or younger, your body mass index should be between 19-25. Your Body mass index is 39.25 kg/m. If this is out of the aformentioned range listed, please consider follow up with your Primary Care Provider.   You have been scheduled for a colonoscopy. Please follow written instructions given to you at your visit today.  Please pick up your prep supplies at the pharmacy within the next 1-3 days. If you use inhalers (even only as needed), please bring them with you on the day of your procedure.  You will be contaced by our office prior to your procedure for directions on holding your Xarelto.  If you do not hear from our office 1 week prior to your scheduled procedure, please call [phone] to discuss.  Thank you for choosing me and [facility]  Scarlette Shorts, MD

## 2019-08-21 NOTE — Telephone Encounter (Signed)
Request for surgical clearance:     Endoscopy Procedure  What type of surgery is being performed?     colonoscopy  When is this surgery scheduled?     10/05/2019  What type of clearance is required ?   Pharmacy  Are there any medications that need to be held prior to surgery and how long? Xarelto x2 days  Practice name and name of physician performing surgery?      [facility]  What is your office phone and fax number?      Phone- [phone]  [phone]  Anesthesia type (None, local, MAC, general) ?       MAC

## 2019-08-21 NOTE — Telephone Encounter (Signed)
This will need to be addressed by Dr. Damita Dunnings who is the primary prescriber of the medication. The medication is for DVT diagnosed on 9/25.   Usually for those who is being treated for DVT for the first time, we recommend completing a 3 to 6 month course of anticoagulation. By the time he has his colonoscopy, he still not quite 3 month out from the date of his venous doppler that confirmed the DVT (9/25), although Dr. Blanch Media note today suggest he wanted to do the colonoscopy after patient has completed 3 month of Xarelto. I question if the date of colonoscopy was mistakenly scheduled a few days too early. Since cardiology service has never seen this patient in the past, we will defer the decision to PCP to clear the patient.

## 2019-08-23 NOTE — Telephone Encounter (Signed)
The original plan was for 3-6 months of anticoagulation with patient to be rechecked at the office at the 3 month point to discuss continuing with the 4th-6th months of treatment.  That would be ~10/16/2019.    I would still rec 3 months minimum treatment and discussion with patient that point about any continued anticoagulation at a visit.    I would not advise doing the colonoscopy prior to 3 months of treatment and/or prior to that discussion, unless there was an urgent need for colonoscopy.   I appreciate the help of all involved.

## 2019-08-24 NOTE — Telephone Encounter (Signed)
FYI, I believe that the issues were practical issues. 1.  Request to perform before the end of the year for financial reasons (patient deductible) 2.  No available appointments at or beyond the 4-month date.  Thus, scheduled a week or 2 earlier.  Thanks

## 2019-08-25 ENCOUNTER — Telehealth: Payer: Self-pay | Admitting: Internal Medicine

## 2019-08-25 NOTE — Telephone Encounter (Signed)
Pt called to let Dr. Henrene Pastor know he, the patient, misunderstood his PCP and he will be on the blood thinner for 6 mths and reassess after 3 months. Also states it depends how his flex spending account goes, he may need to cancel his appt but he wanted to leave it as scheduled for now. Dr. Henrene Pastor aware.

## 2019-08-25 NOTE — Telephone Encounter (Signed)
Pt wanted to inform Dr. Henrene Pastor that he was incorrect about treatment with blood thinners. He said that his PCP wants him to be on bt for 6 months but wants to reassess pt after 3 months. He also mentioned that he is having some troubles with his flexible spending account that if he is not able to work out, he will have to cancel his procedure. He is keeping the procedure for now.

## 2019-08-26 NOTE — Telephone Encounter (Signed)
Noted  

## 2019-08-26 NOTE — Telephone Encounter (Signed)
The procedure is not urgent. What ever works out best for him regarding the medical and non-medical concerns. Thanks for the notification.

## 2019-08-27 NOTE — Telephone Encounter (Signed)
Please see the phone note from August 25, 2019.  It appears that the patient wants to keep his scheduled procedure as of now.  He may change his mind, but would let us know.

## 2019-08-27 NOTE — Telephone Encounter (Signed)
Olivia Mackie gave me this to finish up.  It appears that you itemized the reasoning for when patient's colon was scheduled to Dr. Damita Dunnings and he never responded back with any clarification.  Should I reach out to him again?  It sounds like he doesn't want [place] to schedule it until after three full months and a discussion with the patient about continued anticoagulation.

## 2019-08-28 NOTE — Telephone Encounter (Signed)
Oh wow I didn't even see that.  Thank you.

## 2019-08-30 NOTE — Telephone Encounter (Signed)
If the patient needs to have the colonoscopy done before the 3 months is up, then he will need to stop the Xarelto the day before the procedure and restart it the day after, assuming there is no significant bleeding from the procedure itself.  That means he will be off Xarelto the day before and the day of the procedure.  If Dr. Henrene Pastor has a preference otherwise then please let me know.  The initial advice still stands to take it for 3 months if at all possible.  I will defer to the patient and GI regarding scheduling otherwise.  I think it still makes sense for the patient to have a 40-month follow-up with me about anticoagulation, to decide about anticoagulation in months 4-6.  I will asked staff to update the patient about his situation.  L[place]-please call the patient about the above message.  Thanks.

## 2019-08-31 NOTE — Telephone Encounter (Signed)
Patient states that as of now, he has canceled the colonoscopy because his Flexible Spending Account has some issues and [facility] is not releasing any of his money for any scheduling right now.  Patient was reminded that he does need follow up to discuss anticoagulation in months 4-6 but patient again states, he cannot schedule anything until these issues with the FSA are settled.

## 2019-08-31 NOTE — Telephone Encounter (Signed)
Noted. Thanks.  I'll defer to patient.  

## 2019-09-12 ENCOUNTER — Other Ambulatory Visit: Payer: Self-pay | Admitting: Internal Medicine

## 2019-10-05 ENCOUNTER — Encounter: Payer: 59 | Admitting: Internal Medicine

## 2019-10-05 LAB — HM DIABETES EYE EXAM

## 2019-10-21 ENCOUNTER — Other Ambulatory Visit: Payer: Self-pay | Admitting: Internal Medicine

## 2019-10-22 ENCOUNTER — Ambulatory Visit: Payer: 59 | Admitting: Family Medicine

## 2019-10-22 ENCOUNTER — Other Ambulatory Visit: Payer: Self-pay

## 2019-10-22 ENCOUNTER — Encounter: Payer: Self-pay | Admitting: Family Medicine

## 2019-10-22 DIAGNOSIS — I824Z9 Acute embolism and thrombosis of unspecified deep veins of unspecified distal lower extremity: Secondary | ICD-10-CM | POA: Diagnosis not present

## 2019-10-22 MED ORDER — RIVAROXABAN 20 MG PO TABS: 20.0000 mg | ORAL_TABLET | Freq: Every day | ORAL | 2 refills | Status: DC

## 2019-10-22 NOTE — Progress Notes (Signed)
This visit occurred during the SARS-CoV-2 public health emergency.  Safety protocols were in place, including screening questions prior to the visit, additional usage of staff PPE, and extensive cleaning of exam room while observing appropriate contact time as indicated for disinfecting solutions.  He is trying to lose weight with diet.  He has seen endocrine this fall.  Discussed.  Anticoagulation d/w pt.  S/p 3 months tx with xarelto.  No ADE on med.  No bruising.  No bleeding.  No ADE other than some GI upset that was tolerable.  This was his first episode of DVT.  His mother has CVA hx but she also has A fib.  No known FH DVT.  We talked about travel considerations, to break up the trip and get out and stretch.  He smokes.  He has not been able to quit so far.  Discussed.  Meds, vitals, and allergies reviewed.   ROS: Per HPI unless specifically indicated in ROS section   GEN: nad, alert and oriented HEENT: ncat NECK: supple w/o LA CV: rrr. PULM: ctab, no inc wob ABD: soft, +bs EXT: no edema SKIN: no acute rash

## 2019-10-22 NOTE — Patient Instructions (Signed)
Take another 3 months of xarelto then stop.  Update me when you have the colonoscopy scheduled.  We can make plans to briefly hold your medicine prior to that.  Take care.  Glad to see you.

## 2019-10-23 ENCOUNTER — Other Ambulatory Visit: Payer: Self-pay | Admitting: Family Medicine

## 2019-10-23 DIAGNOSIS — E1140 Type 2 diabetes mellitus with diabetic neuropathy, unspecified: Secondary | ICD-10-CM

## 2019-10-25 NOTE — Assessment & Plan Note (Signed)
We talked about options.  This was his first episode and he does not have an indication for lifelong anticoagulation at this point.  The options would be to stop now or continue for a total of 6 months treatment.  This is assuming he did not have a second event that would dictate a change in therapy.  He understood all of that.  If he does need to stop anticoagulation for procedure/colonoscopy, then we can help manage that.  Stopping anticoagulation briefly after the 44-month point is likely lower risk then stopping anticoagulation prior to the 44-month point.  We talked about risk reduction especially with long travel.  We talked about smoking cessation.  He can work on that.  He does not have any obvious adverse effect on the medication and his bleeding risk is likely acceptably low to continue for a full 6 months.  He agrees with that.  I think it makes sense to take a full 6 months.  When he completes 6 months of Xarelto he will stop the medication and update me as needed.  He agrees with plan. At least 20 minutes were devoted to patient care in this encounter (this can potentially include time spent reviewing the patient's file/history, interviewing and examining the patient, counseling/reviewing plan with patient, ordering referrals, ordering tests, reviewing relevant laboratory or x-ray data, and documenting the encounter).

## 2019-12-16 ENCOUNTER — Other Ambulatory Visit: Payer: Self-pay | Admitting: Internal Medicine

## 2019-12-17 ENCOUNTER — Other Ambulatory Visit: Payer: Self-pay

## 2019-12-18 ENCOUNTER — Encounter: Payer: Self-pay | Admitting: Internal Medicine

## 2019-12-18 ENCOUNTER — Other Ambulatory Visit: Payer: Self-pay

## 2019-12-18 ENCOUNTER — Ambulatory Visit (INDEPENDENT_AMBULATORY_CARE_PROVIDER_SITE_OTHER): Payer: 59 | Admitting: Internal Medicine

## 2019-12-18 DIAGNOSIS — E785 Hyperlipidemia, unspecified: Secondary | ICD-10-CM | POA: Diagnosis not present

## 2019-12-18 DIAGNOSIS — E1140 Type 2 diabetes mellitus with diabetic neuropathy, unspecified: Secondary | ICD-10-CM

## 2019-12-18 DIAGNOSIS — Z794 Long term (current) use of insulin: Secondary | ICD-10-CM

## 2019-12-18 NOTE — Patient Instructions (Addendum)
Please continue:  - Metformin 1000 mg 2x a day with meals. - Invokana 100 mg daily in a.m. - Ozempic 1 mg weekly - Tresiba 40 units daily  Try to check if the following are covered: - Jardiance - Farxiga - Steglatro  Please return in 3 months with your sugar log.

## 2019-12-18 NOTE — Progress Notes (Signed)
Patient ID: Jerry Eaton, male   DOB: February 18, 1971, 49 y.o.   MRN: [id number]   Patient location: Home My location: Office Persons participating in the virtual visit: patient, provider  Referring Provider: Tonia Ghent, MD  I connected with the patient on 12/18/19 at 10:15 AM EST by a phone and verified that I am speaking with the correct person.   I discussed the limitations of evaluation and management by phone and the availability of in person appointments. The patient expressed understanding and agreed to proceed.   Details of the encounter are shown below.  HPI: Jerry Eaton is a 49 y.o.-year-old male, returning for follow-up for DM2, dx in ~2013, insulin-dependent since 2016-2017, uncontrolled, with complications (PN). Last visit 4 months ago.  He was exposed to Covid yesterday >> we had to do a virtual appt.  Reviewed HbA1c levels: Lab Results  Component Value Date   HGBA1C 6.2 (A) 08/21/2019   HGBA1C 6.5 (A) 03/26/2019   HGBA1C 7.2 (A) 11/24/2018   Pt was on a regimen of: - Metformin 500 mg 1x a day, with meals (Rx'ed BID, forgets second dose) - Lantus 65 units at bedtime  Tried JanuMet >> transaminitis.  Now on: - Metformin 1000 mg 2x a day with meals. - Invokana 100 mg daily in a.m. - increased price - Ozempic 1 mg weekly - Tresiba 64 >> 40 units daily  He checks sugars 1-2 times a day: - am: 105-161, 180, 202 >> 118-137, 172 >> 96, 130-150 - 2h after b'fast:190 x1 >> n/c >> 180-277 >> n/c >> 223 >> n/c - before lunch:  150 >> n/c >> 130-190 >> n/c >> 122-154 >> n/c - 2h after lunch:  180-202 >> 160-180 >> n/c >> 124, 202 >> n/c - before dinner: 90-190 >> 94, 111-153 >> n/c >> 191 >> n/c - 2h after dinner: 188-225 >> 150-250 >> n/c  >> 105, 126 >> n/c - bedtime: n/c - nighttime: n/c He has hypoglycemia awareness in the 70s. Lowest: 150 >> 105 >> 105 >> 96 Highest: 200 >> 223 >> 172 >> 200  Glucometer: OneTouch  Verio IQ;   Pt's meals are: -  Breakfast: bisquits - Lunch: sandwich, burgers (may skip) - Dinner: burgers, chicken, steak - Snacks: 0-1 He is still traveling a lot for work  -No CKD, last BUN/creatinine:  Lab Results  Component Value Date   BUN 20 07/17/2019   BUN 18 03/26/2019   CREATININE 0.81 07/17/2019   CREATININE 0.79 03/26/2019   ACR normal: Lab Results  Component Value Date   MICRALBCREAT 0.6 03/26/2019   MICRALBCREAT 4.6 07/14/2018   MICRALBCREAT 4.1 02/27/2017   MICRALBCREAT 0.6 09/19/2015   MICRALBCREAT 0.7 07/26/2014   On lisinopril 2.5.  -+ HL.  Last set of lipids: Lab Results  Component Value Date   CHOL 199 03/26/2019   HDL 34.80 (L) 03/26/2019   LDLCALC 140 (H) 03/26/2019   LDLDIRECT 163.5 05/16/2012   TRIG 124.0 03/26/2019   CHOLHDL 6 03/26/2019  He tried Crestor and Lipitor but these caused joint and muscle pain.  CoQ10 was not helping.  We tried Lescol XL in 06/2017 but he had shoulder pain from this.  Currently on Zetia 10.    -  last eye exam was: 09/2018: No DR  -+ numbness and tingling in feet-improved  He was traveling to the [place] once a year to visit his girlfriend before the coronavirus pandemic. Now he married his girlfriend and she wil move to  [place] in 09/2019.  ROS: Constitutional: + weight gain/no weight loss, no fatigue, no subjective hyperthermia, no subjective hypothermia Eyes: no blurry vision, no xerophthalmia ENT: no sore throat, no nodules palpated in neck, no dysphagia, no odynophagia, no hoarseness Cardiovascular: no CP/no SOB/no palpitations/no leg swelling Respiratory: no cough/no SOB/no wheezing Gastrointestinal: no N/no V/no D/no C/no acid reflux Musculoskeletal: no muscle aches/no joint aches Skin: no rashes, no hair loss, + itching - no pattern, different parts of the body, increasing Neurological: no tremors/+ numbness/+ tingling/no dizziness  I reviewed pt's medications, allergies, PMH, social hx, family hx, and changes were documented in the  history of present illness. Otherwise, unchanged from my initial visit note.  Past Medical History:  Diagnosis Date  . Carpal tunnel syndrome   . Diabetes mellitus   . DVT (deep venous thrombosis) ([place])   . Family history of polyps in the colon   . Fatty liver   . GERD (gastroesophageal reflux disease)   . History of chicken pox   . Hyperlipidemia    no meds now 05-28-16  . Obesity   . Sleep apnea    not using CPAP at this time 05-28-16   Past Surgical History:  Procedure Laterality Date  . COLONOSCOPY     2012, 2017  . LUMBAR DISC SURGERY     L4/L5  . PILONIDAL CYST EXCISION     2001   Social History   Social History  . Marital status: Single    Spouse name: N/A  . Number of children: 0   Social History Main Topics  . Smoking status: Current Every Day Smoker    Packs/day: 1.00    Years: 20.00    Types: Cigarettes  . Smokeless tobacco: Never Used  . Alcohol use Yes     Comment: 10-15 per week, usually on the weekends  . 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 ezetimibe (ZETIA) 10 MG tablet TAKE ONE TABLET BY MOUTH ONE TIME DAILY  30 tablet 0  . glucose blood test strip Check sugar before and 2 hours after meals. Okay to check >3 times per day- insulin treated, uncontrolled DM2. 300 each 1  . Insulin Degludec (TRESIBA FLEXTOUCH) 200 UNIT/ML SOPN Inject 40 units into the skin once daily.    . INVOKANA 100 MG TABS tablet TAKE 1 TABLET BY MOUTH ONCE A DAY BEFORE BREAKFAST  90 tablet 0  . Lancets (ONETOUCH ULTRASOFT) lancets Check sugar before and 2 hours after meals. Okay to check >3 times per day- insulin treated, uncontrolled DM2. 300 each 1  . lisinopril (ZESTRIL) 2.5 MG tablet TAKE ONE TABLET BY MOUTH ONE TIME DAILY  90 tablet 2  . metFORMIN (GLUCOPHAGE) 1000 MG tablet TAKE ONE TABLET BY  MOUTH TWICE DAILY WITH MEALS 180 tablet 0  . rivaroxaban (XARELTO) 20 MG TABS tablet Take 1 tablet (20 mg total) by mouth daily with supper. 30 tablet 2  . Semaglutide, 1 MG/DOSE, (OZEMPIC, 1 MG/DOSE,) 2 MG/1.5ML SOPN Inject 1 mg into the skin once a week. 6 pen 3  . UNIFINE PENTIPS PLUS 32G X 4 MM MISC USE 1 PENTIP ONCE A DAY 100 each 2   No current facility-administered medications on file prior to visit.   Allergies  Allergen Reactions  . Other Anaphylaxis  Bee stings  . Chantix [Varenicline Tartrate]     irritable  . Crestor [Rosuvastatin Calcium]     myalgia  . Januvia [Sitagliptin [place] in LFTs. Occurred with janumet, but prev tolerated metformin alone  . Lipitor [Atorvastatin Calcium]     myalgia   Family History  Problem Relation Age of Onset  . Diabetes Mother   . Stroke Mother   . Diabetes Father   . Prostate cancer Father        dx'd in his early 12s  . Hyperlipidemia Brother   . Colon cancer Paternal Uncle        dx'd near age 15  . Diabetes Maternal Uncle   . Esophageal cancer Neg Hx   . Stomach cancer Neg Hx   . Rectal cancer Neg Hx   Pt has FH of DM in Mother, father, M uncle.   PE: There were no vitals taken for this visit. Wt Readings from Last 3 Encounters:  10/22/19 294 lb 5 oz (133.5 kg)  08/21/19 285 lb 6 oz (129.4 kg)  08/21/19 286 lb 3.2 oz (129.8 kg)   Constitutional:  in NAD  The physical exam was not performed (phone visit).  ASSESSMENT: 1. DM2, insulin-dependent, uncontrolled, with complications - PN  -He had a freestyle libre CGM in the past but this kept coming off, so he is not using it. -No personal history of pancreatitis or family history of medullary thyroid cancer  2. HL  3.  Obesity class III  PLAN:  1. Patient with  longstandin, previously uncontrolled, type 2 diabetes, on Metformin, SGLT2 inhibitor, basal insulin and weekly GLP-1 receptor agonist.  His sugars started to improve after we started Ozempic.   He is tolerating this well. -At last visit, sugars are mostly at goal in the morning and he was not checking later in the day, but whenever he checked, and these were at goal.  HbA1c was excellent, at 6.2% - at this visit, sugars are higher in the morning but he is not checking later in the day.  I again advised him to check later in the day, also, rotating check times.  Regarding the high blood sugars in the morning he tells me that he is not completely consistent with [place].  We discussed about the importance of taking it consistently, every day. -Invokana is now expensive in the new year and high gave him alternatives to check with his insurance to see if they are cheaper.  He will let me know. -Otherwise, we will continue the current regimen. - I suggested to:  Patient Instructions  Please continue:  - Metformin 1000 mg 2x a day with meals. - Invokana 100 mg daily in a.m. - Ozempic 1 mg weekly - Tresiba 40 units daily  Try to check if the following are covered: - Jardiance - Farxiga - Steglatro  Please return in 3 months with your sugar log.   - we will recheck his HbA1c when he returns to the clinic - advised to check sugars at different times of the day - 1-2x a day, rotating check times - advised for yearly eye exams >> he is UTD - return to clinic in 3 months    2. HL -Reviewed latest lipid panel from 03/2019: LDL above goal, HDL low Lab Results  Component Value Date   CHOL 199 03/26/2019   HDL 34.80 (L) 03/26/2019   LDLCALC 140 (H) 03/26/2019   LDLDIRECT 163.5 05/16/2012   TRIG 124.0  03/26/2019   CHOLHDL 6 03/26/2019  -He had joint pains from statins, including Lescol XL.  He is on Zetia now.  He tolerates this well.  3.  Obesity class III -Continue Invokana and Ozempic which should also help with weight loss -net weight  Stable since last OV  - time spent with the patient: 12 min, of which >50% was spent in obtaining information about his diabetes, reviewing his  previous labs, evaluations, and treatments, counseling him about his conditions (please see the discussed topics above), and developing a plan to further investigate and treat them; he had a number of questions which I addressed.   Philemon Kingdom, MD PhD [facility]

## 2019-12-21 ENCOUNTER — Ambulatory Visit: Payer: 59 | Admitting: Internal Medicine

## 2019-12-24 ENCOUNTER — Telehealth: Payer: Self-pay | Admitting: Internal Medicine

## 2019-12-24 NOTE — Telephone Encounter (Signed)
Patient called to advise that at his eye exam in December 2020 he eye doctor detected slight retinopathy.  Wanted to advise of the finding and what if anything needed to be addressed

## 2019-12-25 NOTE — Telephone Encounter (Signed)
Will need to get the report from the eye doctor, they usually make the recommendations.  Left message for patient to have his eye doctor send up the report.

## 2019-12-29 ENCOUNTER — Encounter: Payer: Self-pay | Admitting: Internal Medicine

## 2019-12-29 NOTE — Telephone Encounter (Signed)
Noted.  Further management per his eye doctor.  We also definitely need to keep his sugars under control.

## 2019-12-29 NOTE — Telephone Encounter (Signed)
Report received:  Background DR detected but only needs monitoring.

## 2019-12-30 NOTE — Telephone Encounter (Signed)
Notified patient of message from Dr. Gherghe, patient expressed understanding and agreement. No further questions.  

## 2020-01-29 ENCOUNTER — Telehealth: Payer: Self-pay

## 2020-01-29 ENCOUNTER — Other Ambulatory Visit: Payer: Self-pay

## 2020-01-29 ENCOUNTER — Other Ambulatory Visit: Payer: Self-pay | Admitting: Internal Medicine

## 2020-01-29 ENCOUNTER — Ambulatory Visit (INDEPENDENT_AMBULATORY_CARE_PROVIDER_SITE_OTHER): Payer: 59

## 2020-01-29 DIAGNOSIS — Z1159 Encounter for screening for other viral diseases: Secondary | ICD-10-CM

## 2020-01-29 NOTE — Telephone Encounter (Signed)
Called to ask patientt if he was holding his Jerry Eaton prior to procedure. He said he was no longer taking it, his Dr discontinued.

## 2020-01-30 LAB — SARS CORONAVIRUS 2 (TAT 6-24 HRS): SARS Coronavirus 2: NEGATIVE

## 2020-02-02 ENCOUNTER — Ambulatory Visit (AMBULATORY_SURGERY_CENTER): Payer: 59 | Admitting: Internal Medicine

## 2020-02-02 ENCOUNTER — Other Ambulatory Visit: Payer: Self-pay

## 2020-02-02 ENCOUNTER — Encounter: Payer: Self-pay | Admitting: Internal Medicine

## 2020-02-02 VITALS — BP 119/57 | HR 77 | Temp 96.9°F | Resp 20 | Ht 71.0 in | Wt 294.0 lb

## 2020-02-02 DIAGNOSIS — Z86010 Personal history of colonic polyps: Secondary | ICD-10-CM

## 2020-02-02 DIAGNOSIS — D125 Benign neoplasm of sigmoid colon: Secondary | ICD-10-CM

## 2020-02-02 DIAGNOSIS — D124 Benign neoplasm of descending colon: Secondary | ICD-10-CM

## 2020-02-02 MED ORDER — SODIUM CHLORIDE 0.9 % IV SOLN: 500.0000 mL | INTRAVENOUS | Status: DC

## 2020-02-02 NOTE — Op Note (Signed)
Jerry Eaton: Jerry Eaton Procedure Date: 02/02/2020 11:17 AM MRN: [id number] Endoscopist: Docia Chuck. Henrene Pastor , MD Age: 49 Referring MD:  Date of Birth: 03-24-1971 Gender: Male Account #: [id number] Procedure:                Colonoscopy with cold snare polypectomy x 2 Indications:              High risk colon cancer surveillance: Personal                            history of multiple (3 or more) adenomas. Previous                            examinations 2006, 2012, 2017 Medicines:                Monitored Anesthesia Care Procedure:                Pre-Anesthesia Assessment:                           - Prior to the procedure, a History and Physical                            was performed, and patient medications and                            allergies were reviewed. The patient's tolerance of                            previous anesthesia was also reviewed. The risks                            and benefits of the procedure and the sedation                            options and risks were discussed with the patient.                            All questions were answered, and informed consent                            was obtained. Prior Anticoagulants: The patient has                            taken no previous anticoagulant or antiplatelet                            agents. ASA Grade Assessment: II - A patient with                            mild systemic disease. After reviewing the risks                            and benefits, the patient was deemed in  satisfactory condition to undergo the procedure.                           After obtaining informed consent, the colonoscope                            was passed under direct vision. Throughout the                            procedure, the patient's blood pressure, pulse, and                            oxygen saturations were monitored continuously. The   Colonoscope was introduced through the anus and                            advanced to the the cecum, identified by                            appendiceal orifice and ileocecal valve. The                            ileocecal valve, appendiceal orifice, and rectum                            were photographed. The quality of the bowel                            preparation was excellent. The colonoscopy was                            performed without difficulty. The patient tolerated                            the procedure well. The bowel preparation used was                            SUPREP via split dose instruction. Scope In: 11:28:38 AM Scope Out: 11:41:18 AM Scope Withdrawal Time: 0 hours 9 minutes 25 seconds  Total Procedure Duration: 0 hours 12 minutes 40 seconds  Findings:                 Two polyps were found in the sigmoid colon and                            descending colon. The polyps were 3 to 4 mm in                            size. These polyps were removed with a cold snare.                            Resection and retrieval were complete.  Internal hemorrhoids were found during retroflexion.                           The exam was otherwise without abnormality on                            direct and retroflexion views. Complications:            No immediate complications. Estimated blood loss:                            None. Estimated Blood Loss:     Estimated blood loss: none. Impression:               - Two 3 to 4 mm polyps in the sigmoid colon and in                            the descending colon, removed with a cold snare.                            Resected and retrieved.                           - Internal hemorrhoids.                           - The examination was otherwise normal on direct                            and retroflexion views. Recommendation:           - Repeat colonoscopy in 5 years for surveillance.                            - Patient has a contact number available for                            emergencies. The signs and symptoms of potential                            delayed complications were discussed with the                            patient. Return to normal activities tomorrow.                            Written discharge instructions were provided to the                            patient.                           - Resume previous diet.                           - Continue present medications.                           -  Await pathology results. Docia Chuck. Henrene Pastor, MD 02/02/2020 11:46:56 AM This report has been signed electronically.

## 2020-02-02 NOTE — Progress Notes (Signed)
Temp LC v/s CW 

## 2020-02-02 NOTE — Progress Notes (Signed)
PT taken to PACU. Monitors in place. VSS. Report given to RN. 

## 2020-02-02 NOTE — Progress Notes (Signed)
Called to room to assist during endoscopic procedure.  Patient ID and intended procedure confirmed with present staff. Received instructions for my participation in the procedure from the performing physician.  

## 2020-02-02 NOTE — Patient Instructions (Signed)
Handout on polyps given. ° °YOU HAD AN ENDOSCOPIC PROCEDURE TODAY AT THE [facility]:   Refer to the procedure report that was given to you for any specific questions about what was found during the examination.  If the procedure report does not answer your questions, please call your gastroenterologist to clarify.  If you requested that your care partner not be given the details of your procedure findings, then the procedure report has been included in a sealed envelope for you to review at your convenience later. ° °YOU SHOULD EXPECT: Some feelings of bloating in the abdomen. Passage of more gas than usual.  Walking can help get rid of the air that was put into your GI tract during the procedure and reduce the bloating. If you had a lower endoscopy (such as a colonoscopy or flexible sigmoidoscopy) you may notice spotting of blood in your stool or on the toilet paper. If you underwent a bowel prep for your procedure, you may not have a normal bowel movement for a few days. ° °Please Note:  You might notice some irritation and congestion in your nose or some drainage.  This is from the oxygen used during your procedure.  There is no need for concern and it should clear up in a day or so. ° °SYMPTOMS TO REPORT IMMEDIATELY: ° °Following lower endoscopy (colonoscopy or flexible sigmoidoscopy): ° Excessive amounts of blood in the stool ° Significant tenderness or worsening of abdominal pains ° Swelling of the abdomen that is new, acute ° Fever of 100°F or higher ° °For urgent or emergent issues, a gastroenterologist can be reached at any hour by calling [phone]. °Do not use MyChart messaging for urgent concerns.  ° ° °DIET:  We do recommend a small meal at first, but then you may proceed to your regular diet.  Drink plenty of fluids but you should avoid alcoholic beverages for 24 hours. ° °ACTIVITY:  You should plan to take it easy for the rest of today and you should NOT DRIVE or use heavy machinery  until tomorrow (because of the sedation medicines used during the test).   ° °FOLLOW UP: °Our staff will call the number listed on your records 48-72 hours following your procedure to check on you and address any questions or concerns that you may have regarding the information given to you following your procedure. If we do not reach you, we will leave a message.  We will attempt to reach you two times.  During this call, we will ask if you have developed any symptoms of COVID 19. If you develop any symptoms (ie: fever, flu-like symptoms, shortness of breath, cough etc.) before then, please call [phone].  If you test positive for Covid 19 in the 2 weeks post procedure, please call and report this information to us.   ° °If any biopsies were taken you will be contacted by phone or by letter within the next 1-3 weeks.  Please call us at [phone] if you have not heard about the biopsies in 3 weeks.  ° ° °SIGNATURES/CONFIDENTIALITY: °You and/or your care partner have signed paperwork which will be entered into your electronic medical record.  These signatures attest to the fact that that the information above on your After Visit Summary has been reviewed and is understood.  Full responsibility of the confidentiality of this discharge information lies with you and/or your care-partner.  °

## 2020-02-04 ENCOUNTER — Telehealth: Payer: Self-pay

## 2020-02-04 ENCOUNTER — Encounter: Payer: Self-pay | Admitting: Internal Medicine

## 2020-02-04 ENCOUNTER — Telehealth: Payer: Self-pay | Admitting: *Deleted

## 2020-02-04 NOTE — Telephone Encounter (Signed)
Attempted f/u phone call. No answer. Left message. °

## 2020-02-04 NOTE — Telephone Encounter (Signed)
  Follow up Call-  Call back number 02/02/2020  Post procedure Call Back phone  # [phone]  Permission to leave phone message Yes  Some recent data might be hidden     Patient questions:  Do you have a fever, pain , or abdominal swelling? No. Pain Score  0 *  Have you tolerated food without any problems? Yes.    Have you been able to return to your normal activities? Yes.    Do you have any questions about your discharge instructions: Diet   No. Medications  No. Follow up visit  No.  Do you have questions or concerns about your Care? No.  Actions: * If pain score is 4 or above: No action needed, pain <4.   1. Have you developed a fever since your procedure? no  2.   Have you had an respiratory symptoms (SOB or cough) since your procedure? no  3.   Have you tested positive for COVID 19 since your procedure no  4.   Have you had any family members/close contacts diagnosed with the COVID 19 since your procedure?  no   If yes to any of these questions please route to Joylene John, RN and Erenest Rasher, RN

## 2020-02-06 ENCOUNTER — Other Ambulatory Visit: Payer: Self-pay | Admitting: Internal Medicine

## 2020-02-09 ENCOUNTER — Encounter: Payer: Self-pay | Admitting: Family Medicine

## 2020-02-09 ENCOUNTER — Other Ambulatory Visit: Payer: Self-pay

## 2020-02-09 ENCOUNTER — Ambulatory Visit: Payer: 59 | Admitting: Family Medicine

## 2020-02-09 DIAGNOSIS — L509 Urticaria, unspecified: Secondary | ICD-10-CM

## 2020-02-09 MED ORDER — LORATADINE 10 MG PO TABS: 10.0000 mg | ORAL_TABLET | Freq: Every day | ORAL | 12 refills | Status: AC

## 2020-02-09 MED ORDER — FAMOTIDINE 20 MG PO TABS: 20.0000 mg | ORAL_TABLET | Freq: Two times a day (BID) | ORAL | 12 refills | Status: DC

## 2020-02-09 NOTE — Patient Instructions (Signed)
Add on claritin 10mg  a day and pepcid 20mg  twice a day.  Update me next week.  We may need to get you over to the allergy clinic.  Take care.  Glad to see you.

## 2020-02-09 NOTE — Progress Notes (Signed)
This visit occurred during the SARS-CoV-2 public health emergency.  Safety protocols were in place, including screening questions prior to the visit, additional usage of staff PPE, and extensive cleaning of exam room while observing appropriate contact time as indicated for disinfecting solutions.  Itching and hives.  No new meds. Itching isn't new but is clearly escalating.  He has itching on the scalp and ears.  On the back.  He has hives that will come up and then resolved.  Can be on the arms and trunk.  No lip or tongue swelling.  No airway sx.  No new soaps, etc.  Going on for years.  Hives going on episodically for less time but still 1 year, at least.  He hasn't noted mammalian meat as a trigger.   He had covid vaccine done.  D/w pt.    Meds, vitals, and allergies reviewed.   ROS: Per HPI unless specifically indicated in ROS section   GEN: nad, alert and oriented HEENT: ncat NECK: supple w/o LA, no stridor. CV: rrr. PULM: ctab, no inc wob ABD: soft, +bs EXT: no edema SKIN: He has some mild blanching nondermatomal macular erythema on the bilateral anterior upper chest.  No other hives.  No ulceration.

## 2020-02-10 DIAGNOSIS — L509 Urticaria, unspecified: Secondary | ICD-10-CM | POA: Insufficient documentation

## 2020-02-10 NOTE — Assessment & Plan Note (Signed)
He does not have a history suggestive of alpha gal.  Still okay for outpatient follow-up.  Discussed options Add on claritin 10mg  a day and pepcid 20mg  twice a day.  Update me next week.  We may need to refer him to the allergy clinic if he has persistent symptoms.  He agrees.

## 2020-03-03 ENCOUNTER — Other Ambulatory Visit: Payer: Self-pay | Admitting: Internal Medicine

## 2020-03-12 ENCOUNTER — Other Ambulatory Visit: Payer: Self-pay | Admitting: Internal Medicine

## 2020-03-14 ENCOUNTER — Ambulatory Visit: Payer: 59 | Admitting: Internal Medicine

## 2020-04-08 ENCOUNTER — Other Ambulatory Visit: Payer: Self-pay | Admitting: Internal Medicine

## 2020-04-28 ENCOUNTER — Encounter: Payer: Self-pay | Admitting: Internal Medicine

## 2020-04-28 ENCOUNTER — Ambulatory Visit: Payer: 59 | Admitting: Internal Medicine

## 2020-04-28 ENCOUNTER — Other Ambulatory Visit: Payer: Self-pay

## 2020-04-28 ENCOUNTER — Encounter: Payer: 59 | Admitting: Family Medicine

## 2020-04-28 VITALS — BP 122/80 | HR 107 | Ht 71.0 in | Wt 289.0 lb

## 2020-04-28 DIAGNOSIS — Z794 Long term (current) use of insulin: Secondary | ICD-10-CM | POA: Diagnosis not present

## 2020-04-28 DIAGNOSIS — E1140 Type 2 diabetes mellitus with diabetic neuropathy, unspecified: Secondary | ICD-10-CM

## 2020-04-28 DIAGNOSIS — E785 Hyperlipidemia, unspecified: Secondary | ICD-10-CM | POA: Diagnosis not present

## 2020-04-28 LAB — POCT GLYCOSYLATED HEMOGLOBIN (HGB A1C): Hemoglobin A1C: 6.7 % — AB (ref 4.0–5.6)

## 2020-04-28 MED ORDER — OZEMPIC (1 MG/DOSE) 4 MG/3ML ~~LOC~~ SOPN: 1.0000 mg | PEN_INJECTOR | SUBCUTANEOUS | 3 refills | Status: DC

## 2020-04-28 NOTE — Addendum Note (Signed)
Addended by: Cardell Peach I on: 04/28/2020 03:07 PM   Modules accepted: Orders

## 2020-04-28 NOTE — Patient Instructions (Addendum)
Please change: - Metformin 2000 mg with dinner  Please continue: - Invokana 100 mg daily in a.m. - Ozempic 1 mg weekly  Please return in 4 months with your sugar log.

## 2020-04-28 NOTE — Progress Notes (Signed)
Patient ID: Jerry Eaton, male   DOB: 25-Nov-1970, 49 y.o.   MRN: [id number]   This visit occurred during the SARS-CoV-2 public health emergency.  Safety protocols were in place, including screening questions prior to the visit, additional usage of staff PPE, and extensive cleaning of exam room while observing appropriate contact time as indicated for disinfecting solutions.   HPI: Jerry Eaton is a 49 y.o.-year-old male, returning for follow-up for DM2, dx in ~2013, insulin-dependent since 2016-2017, uncontrolled, with complications (DR, PN). Last visit 4.5 months ago (virtual).  His wife and daughter moved here from [place] in 09/2019.  Reviewed HbA1c levels: Lab Results  Component Value Date   HGBA1C 6.2 (A) 08/21/2019   HGBA1C 6.5 (A) 03/26/2019   HGBA1C 7.2 (A) 11/24/2018   Pt was on a regimen of: - Metformin 500 mg 1x a day, with meals (Rx'ed BID, forgets second dose) - Lantus 65 units at bedtime  Tried JanuMet >> transaminitis.  Now on: - Metformin 1000 mg 1-2x a day with meals (forgets evening dose) - Invokana 100 mg daily in a.m. - Ozempic 1 mg weekly - >> stopped ~2 mo ago  He checks sugars 1-2 times a day:: - am: 105-161, 180, 202 >> 118-137, 172 >> 96, 130-150 >> 125-147, 162 - 2h after b'fast:190 x1 >> n/c >> 180-277 >> n/c >> 223 >> n/c - before lunch:  150 >> n/c >> 130-190 >> n/c >> 122-154 >> n/c - 2h after lunch:  180-202 >> 160-180 >> n/c >> 124, 202 >> n/c - before dinner: 90-190 >> 94, 111-153 >> n/c >> 191 >> n/c - 2h after dinner: 188-225 >> 150-250 >> n/c  >> 105, 126 >> n/c - bedtime: n/c - nighttime: n/c He has hypoglycemia awareness in the 70s. Lowest: 150 >> 105 >> 105 >> 96 >> 125 Highest: 200 >> 223 >> 172 >> 200 >> 162  Glucometer: OneTouch  Verio IQ;   Pt's meals are: - Breakfast: bisquits - Lunch: sandwich, burgers (may skip) - Dinner: burgers, chicken, steak - Snacks: 0-1 He is still traveling a lot for work  -No CKD, last  BUN/creatinine:  Lab Results  Component Value Date   BUN 20 07/17/2019   BUN 18 03/26/2019   CREATININE 0.81 07/17/2019   CREATININE 0.79 03/26/2019   ACR normal: Lab Results  Component Value Date   MICRALBCREAT 0.6 03/26/2019   MICRALBCREAT 4.6 07/14/2018   MICRALBCREAT 4.1 02/27/2017   MICRALBCREAT 0.6 09/19/2015   MICRALBCREAT 0.7 07/26/2014   On lisinopril 2.5.  -+ HL.  Last set of lipids: Lab Results  Component Value Date   CHOL 199 03/26/2019   HDL 34.80 (L) 03/26/2019   LDLCALC 140 (H) 03/26/2019   LDLDIRECT 163.5 05/16/2012   TRIG 124.0 03/26/2019   CHOLHDL 6 03/26/2019  He tried Crestor, Lipitor, fluvastatin XL, and they caused joint and muscle aches.  CoQ10 was not helping.  Currently on Zetia 10 without side effects.  -  last eye exam was: 09/2019: + DR  -he has numbness and tingling in feet   He was traveling to the [place] once a year to visit his girlfriend before the coronavirus pandemic.   ROS: Constitutional: no weight gain/no weight loss, no fatigue, no subjective hyperthermia, no subjective hypothermia Eyes: no blurry vision, no xerophthalmia ENT: no sore throat, no nodules palpated in neck, no dysphagia, no odynophagia, no hoarseness Cardiovascular: no CP/no SOB/no palpitations/no leg swelling Respiratory: no cough/no SOB/no wheezing Gastrointestinal: no N/no V/no  D/no C/no acid reflux Musculoskeletal: no muscle aches/no joint aches Skin: no rashes, no hair loss Neurological: no tremors/+ numbness/+ tingling/no dizziness  I reviewed pt's medications, allergies, PMH, social hx, family hx, and changes were documented in the history of present illness. Otherwise, unchanged from my initial visit note.  Past Medical History:  Diagnosis Date  . Clotting disorder (HCC)    DVT  . Diabetes mellitus   . DVT (deep venous thrombosis) ([place])   . Family history of polyps in the colon   . Fatty liver   . GERD (gastroesophageal reflux disease)   .  History of chicken pox   . Hyperlipidemia    no meds now 05-28-16  . Obesity   . Sleep apnea    not using CPAP at this time 05-28-16   Past Surgical History:  Procedure Laterality Date  . COLONOSCOPY     2012, 2017  . LUMBAR DISC SURGERY     L4/L5  . PILONIDAL CYST EXCISION     2001   Social History   Social History  . Marital status: Single    Spouse name: N/A  . Number of children: 0   Social History Main Topics  . Smoking status: Current Every Day Smoker    Packs/day: 1.00    Years: 20.00    Types: Cigarettes  . Smokeless tobacco: Never Used  . Alcohol use Yes     Comment: 10-15 per week, usually on the weekends  . 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 ezetimibe (ZETIA) 10 MG tablet TAKE ONE TABLET BY MOUTH ONE TIME DAILY  30 tablet 3  . famotidine (PEPCID) 20 MG tablet Take 1 tablet (20 mg total) by mouth 2 (two) times daily. 60 tablet 12  . glucose blood test strip Check sugar before and 2 hours after meals. Okay to check >3 times per day- insulin treated, uncontrolled DM2. 300 each 1  . Insulin Degludec (TRESIBA FLEXTOUCH) 200 UNIT/ML SOPN Inject 40 units into the skin once daily.    . INVOKANA 100 MG TABS tablet TAKE ONE TABLET BY MOUTH ONE TIME DAILY BEFORE BREAKFAST 90 tablet 0  . Lancets (ONETOUCH ULTRASOFT) lancets Check sugar before and 2 hours after meals. Okay to check >3 times per day- insulin treated, uncontrolled DM2. 300 each 1  . lisinopril (ZESTRIL) 2.5 MG tablet TAKE ONE TABLET BY MOUTH ONE TIME DAILY  90 tablet 2  . loratadine (CLARITIN) 10 MG tablet Take 1 tablet (10 mg total) by mouth daily. 30 tablet 12  . metFORMIN (GLUCOPHAGE) 1000 MG tablet TAKE ONE TABLET BY MOUTH TWICE DAILY with meals 180 tablet 2  . OZEMPIC, 1 MG/DOSE, 2 MG/1.5ML SOPN INJECT 1MG  INTO THE SKIN ONCE A  WEEK 9 mL 0  . UNIFINE PENTIPS PLUS 32G X 4 MM MISC USE 1 PENTIP ONCE A DAY 100 each 2   No current facility-administered medications on file prior to visit.   Allergies  Allergen Reactions  . Other Anaphylaxis    Bee stings  . Chantix [Varenicline Tartrate]     irritable  . Crestor [Rosuvastatin Calcium]     myalgia  . Januvia [Sitagliptin [place] in LFTs. Occurred with janumet, but prev tolerated metformin alone  . Lipitor [Atorvastatin Calcium]  myalgia   Family History  Problem Relation Age of Onset  . Diabetes Mother   . Stroke Mother   . Diabetes Father   . Prostate cancer Father        dx'd in his early 38s  . Hyperlipidemia Brother   . Colon cancer Paternal Uncle        dx'd near age 63  . Diabetes Maternal Uncle   . Esophageal cancer Neg Hx   . Stomach cancer Neg Hx   . Rectal cancer Neg Hx   Pt has FH of DM in Mother, father, M uncle.   PE: BP 122/80   Pulse (!) 107   Ht 5\' 11"  (1.803 m)   Wt 289 lb (131.1 kg)   SpO2 96%   BMI 40.31 kg/m  Wt Readings from Last 3 Encounters:  04/28/20 289 lb (131.1 kg)  02/09/20 285 lb 7 oz (129.5 kg)  02/02/20 294 lb (133.4 kg)   Constitutional: overweight, in NAD Eyes: PERRLA, EOMI, no exophthalmos ENT: moist mucous membranes, no thyromegaly, no cervical lymphadenopathy Cardiovascular: tachycardia, RR, No MRG Respiratory: CTA B Gastrointestinal: abdomen soft, NT, ND, BS+ Musculoskeletal: no deformities, strength intact in all 4 Skin: moist, warm, no rashes Neurological: no tremor with outstretched hands, DTR normal in all 4  ASSESSMENT: 1. DM2, insulin-dependent, uncontrolled, with complications - PN - DR  -He had a freestyle libre CGM in the past but this kept coming off, so he is not using it. -No personal history of pancreatitis or family history of medullary thyroid cancer  2. HL  3.  Obesity class III  PLAN:  1. Patient with longstanding, previously uncontrolled type 2 diabetes, on  oral medication regimen with Metformin adjusted [place], also weekly GLP-1 receptor agonist and daily long-acting insulin, with good control.  Latest HbA1c was excellent at 6.2% in 07/2019.  Our last visit was virtual so we could not check an HbA1c.  At that time sugars are higher in the morning and he was not checking them later in the day.  I again advised him to check later in the day, also, rotating check times.  He was missing [place] doses at that time and I advised him to try to take it every day. Since Invokana was expensive at last visit, I gave him a list of other SGLT2 inhibitors which we could use -to check with his insurance -At this visit, he tells me that the problem with Invokana is resolved, so this is again free for him.  We will continue it. -He also tells me that he stopped Lantus approximately 2 months ago, after a period in which he was forgetting it so was taking it inconsistently.  His sugars did not decrease significantly afterwards. -At today's visit, reviewing his meter downloads, his sugars are higher than target in the morning, however, he tells me that he is forgetting Metformin at night frequently.  We discussed about setting alarms on his phone to take the Metformin and I advised him to move the entire dose of Metformin at night to hopefully improve the morning sugars so we do not need basal insulin again. -I do not think we need to make any other changes in his regimen. - I suggested to:  Patient Instructions  Please change: - Metformin 2000 mg with dinner  Please continue: - Invokana 100 mg daily in a.m. - Ozempic 1 mg weekly  Please return in 4 months with your sugar log.   - we checked his HbA1c: 6.7% (  higher) - advised to check sugars at different times of the day - 1-2x a day, rotating check times - advised for yearly eye exams >> he is UTD - return to clinic in 4 months   2. HL -Reviewed his latest lipid panel from last year: LDL above goal, HDL slightly  low: Lab Results  Component Value Date   CHOL 199 03/26/2019   HDL 34.80 (L) 03/26/2019   LDLCALC 140 (H) 03/26/2019   LDLDIRECT 163.5 05/16/2012   TRIG 124.0 03/26/2019   CHOLHDL 6 03/26/2019  -He had joint pains from statins, including [place] XL.  He is on Zetia now he tolerates this well. -He has an appointment with PCP coming up  3.  Obesity class III -continue SGLT 2 inhibitor and GLP-1 receptor agonist which should also help with weight loss -He cut out eating out and drinking alcohol since the corona pandemic started. -He gained approximately 3 pounds since last visit  Philemon Kingdom, MD PhD [facility] Endocrinology

## 2020-05-02 ENCOUNTER — Encounter: Payer: 59 | Admitting: Family Medicine

## 2020-05-16 ENCOUNTER — Encounter: Payer: 59 | Admitting: Family Medicine

## 2020-06-03 ENCOUNTER — Encounter: Payer: Self-pay | Admitting: Family Medicine

## 2020-06-03 ENCOUNTER — Other Ambulatory Visit: Payer: Self-pay

## 2020-06-03 ENCOUNTER — Ambulatory Visit (INDEPENDENT_AMBULATORY_CARE_PROVIDER_SITE_OTHER): Payer: 59 | Admitting: Family Medicine

## 2020-06-03 VITALS — BP 122/70 | HR 83 | Temp 96.5°F | Ht 71.75 in | Wt 287.5 lb

## 2020-06-03 DIAGNOSIS — Z7189 Other specified counseling: Secondary | ICD-10-CM

## 2020-06-03 DIAGNOSIS — E1140 Type 2 diabetes mellitus with diabetic neuropathy, unspecified: Secondary | ICD-10-CM

## 2020-06-03 DIAGNOSIS — Z0000 Encounter for general adult medical examination without abnormal findings: Secondary | ICD-10-CM

## 2020-06-03 DIAGNOSIS — Z794 Long term (current) use of insulin: Secondary | ICD-10-CM

## 2020-06-03 DIAGNOSIS — E785 Hyperlipidemia, unspecified: Secondary | ICD-10-CM | POA: Diagnosis not present

## 2020-06-03 DIAGNOSIS — L509 Urticaria, unspecified: Secondary | ICD-10-CM | POA: Diagnosis not present

## 2020-06-03 DIAGNOSIS — Z1159 Encounter for screening for other viral diseases: Secondary | ICD-10-CM

## 2020-06-03 LAB — LIPID PANEL
Cholesterol: 186 mg/dL (ref 0–200)
HDL: 30.6 mg/dL — ABNORMAL LOW (ref >39.00)
LDL Cholesterol: 135 mg/dL — ABNORMAL HIGH (ref 0–99)
NonHDL: 155.88
Total CHOL/HDL Ratio: 6
Triglycerides: 102 mg/dL (ref 0.0–149.0)
VLDL: 20.4 mg/dL (ref 0.0–40.0)

## 2020-06-03 LAB — COMPREHENSIVE METABOLIC PANEL
ALT: 17 U/L (ref 0–53)
AST: 17 U/L (ref 0–37)
Albumin: 4.3 g/dL (ref 3.5–5.2)
Alkaline Phosphatase: 65 U/L (ref 39–117)
BUN: 22 mg/dL (ref 6–23)
CO2: 25 mEq/L (ref 19–32)
Calcium: 9.8 mg/dL (ref 8.4–10.5)
Chloride: 105 mEq/L (ref 96–112)
Creatinine, Ser: 0.81 mg/dL (ref 0.40–1.50)
GFR: 102.3 mL/min (ref >60.00)
Glucose, Bld: 118 mg/dL — ABNORMAL HIGH (ref 70–99)
Potassium: 4.7 mEq/L (ref 3.5–5.1)
Sodium: 138 mEq/L (ref 135–145)
Total Bilirubin: 0.5 mg/dL (ref 0.2–1.2)
Total Protein: 7.5 g/dL (ref 6.0–8.3)

## 2020-06-03 MED ORDER — FAMOTIDINE 20 MG PO TABS: 20.0000 mg | ORAL_TABLET | Freq: Every day | ORAL | Status: DC

## 2020-06-03 MED ORDER — METFORMIN HCL 1000 MG PO TABS: 2000.0000 mg | ORAL_TABLET | Freq: Every day | ORAL | Status: DC

## 2020-06-03 NOTE — Patient Instructions (Addendum)
We'll call about seeing the allergy clinic.  Take care.  Glad to see you. Go to the lab on the way out.   If you have mychart we'll likely use that to update you.

## 2020-06-03 NOTE — Progress Notes (Signed)
This visit occurred during the SARS-CoV-2 public health emergency.  Safety protocols were in place, including screening questions prior to the visit, additional usage of staff PPE, and extensive cleaning of exam room while observing appropriate contact time as indicated for disinfecting solutions.  Tetanus 2013 Flu yearly.  PNA prev done.  Shingles not due covid 2021 Colonoscopy 2021 PSA not due.  Living will d/w pt. Wife designated if patient were incapacitated.   Smoking cessation encouraged.  HCV screening pending 2021  He had prev HAV and HBV vaccines per patient report.    Diabetes:  Per endo.  He is working on weight loss.   Most recent A1c 6.7.    Elevated Cholesterol: Using medications without problems:yes Muscle aches: not on statin- he has some aches and is getting a new bed to see if that will help. He didn't think zetia was contributing.   Statin intolerant.   Diet compliance: yes Exercise:d/w pt.    Skin rash.  Some better with claritin in AM and pepcid in PM.  No clear trigger.  No new soaps.  D/w pt about allergy referral.  He prev had itchy hives, some better now.    PMH and SH reviewed  Meds, vitals, and allergies reviewed.   ROS: Per HPI unless specifically indicated in ROS section   GEN: nad, alert and oriented HEENT: ncat NECK: supple w/o LA, no stridor.  CV: rrr. PULM: ctab, no inc wob ABD: soft, +bs EXT: no edema SKIN: no acute rash initially but has mild dermatographism on the L forearm after application of pressure locally.    Diabetic foot exam: Normal inspection No skin breakdown No calluses  Normal DP pulses Normal sensation to light touch and monofilament Nails normal

## 2020-06-06 LAB — HEPATITIS C ANTIBODY
Hepatitis C Ab: NONREACTIVE
SIGNAL TO CUT-OFF: 0.01 (ref <1.00)

## 2020-06-06 NOTE — Assessment & Plan Note (Signed)
Living will d/w pt.  Wife designated if patient were incapacitated.   ?

## 2020-06-06 NOTE — Assessment & Plan Note (Signed)
Per endo.  He is working on weight loss.   Most recent A1c 6.7.

## 2020-06-06 NOTE — Assessment & Plan Note (Signed)
Tetanus 2013 Flu yearly.  PNA prev done.  Shingles not due covid 2021 Colonoscopy 2021 PSA not due.  Living will d/w pt. Wife designated if patient were incapacitated.   Smoking cessation encouraged.  HCV screening pending 2021  He had prev HAV and HBV vaccines per patient report.

## 2020-06-06 NOTE — Assessment & Plan Note (Addendum)
No change in medications.  Continue work on diet and exercise.  See notes on labs.  He agrees.

## 2020-06-06 NOTE — Assessment & Plan Note (Signed)
Refer to allergy clinic.  Continue Claritin Pepcid for now.  Okay for outpatient follow-up.

## 2020-06-30 ENCOUNTER — Ambulatory Visit: Payer: 59 | Admitting: Internal Medicine

## 2020-07-05 ENCOUNTER — Other Ambulatory Visit: Payer: Self-pay | Admitting: Internal Medicine

## 2020-07-20 ENCOUNTER — Encounter: Payer: Self-pay | Admitting: Allergy

## 2020-07-20 ENCOUNTER — Other Ambulatory Visit: Payer: Self-pay

## 2020-07-20 ENCOUNTER — Ambulatory Visit: Payer: 59 | Admitting: Allergy

## 2020-07-20 VITALS — BP 128/80 | HR 98 | Temp 97.9°F | Resp 14 | Ht 72.0 in | Wt 289.0 lb

## 2020-07-20 DIAGNOSIS — L503 Dermatographic urticaria: Secondary | ICD-10-CM

## 2020-07-20 DIAGNOSIS — L508 Other urticaria: Secondary | ICD-10-CM

## 2020-07-20 NOTE — Patient Instructions (Addendum)
Hives, chronic  -  at this time etiology of hives and swelling is unknown.  Hives can be caused by a variety of different triggers including illness/infection, foods, medications, stings, exercise, pressure, vibrations, extremes of temperature to name a few however majority of the time there is no identifiable trigger.  Your symptoms have been ongoing for >6 weeks making this chronic thus will obtain labwork to evaluate: CBC w diff, CMP, tryptase, hive panel, environmental panel, alpha-gal panel  - for management of hives recommend high-dose antihistamine regimen as follows: Claritin 10mg  1 tab twice a day and Pepcid 20mg  1 tab twice a day  - let us know if the above regimen doesn't control hives enough and if not would add Singulair to the regimen.  If that still doesn't control hives enough then next step is starting Xolair monthly injections.  Xolair is an effective medication to management of hives refractory to high-dose antihistamines   Follow-up 3 months or sooner if needed

## 2020-07-20 NOTE — Progress Notes (Signed)
New Patient Note  RE: Jerry Eaton MRN: [id number] DOB: 1971-07-17 Date of Office Visit: 07/20/2020  Referring provider: Tonia Ghent, MD Primary care provider: Tonia Ghent, MD  Chief Complaint: hives  History of present illness: Jerry Eaton is a 49 y.o. male presenting today for consultation for hives.   Jerry Eaton has been having an itchy, bumpy, red rash.  The rash started months ago. The rash can appear anywhere on body but states Jerry Eaton has had the rash a lot of on his back, neck, scalp, legs. Jerry Eaton denies swelling associated with this rash.  However Jerry Eaton does report having "weird feelings" in his face when the hives are flaring.  Jerry Eaton described the feeling as a pressure feeling.  The rash has been present pretty much daily.  Not leaving any bruising behind.  No significant joint aches/pains. No fevers.  No preceding illnesses.  No new medications or dose changes. No stings/bites.  Jerry Eaton has not been able to find a food trigger.  Jerry Eaton also has changed soaps/detergents but did notice any  change severity of rash.  Jerry Eaton has been taking claritin and pepcid since about June 2021 which has helped.  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  Jerry Eaton thinks Jerry Eaton may be itchier if Jerry Eaton is in a hotter environment.     Review of systems: Review of Systems  Constitutional: Negative.   HENT: Negative.   Eyes: Negative.   Respiratory: Negative.   Cardiovascular: Negative.   Gastrointestinal: Negative.   Musculoskeletal: Negative.   Skin: Positive for itching and rash.  Neurological: Negative.     All other systems negative unless noted above in HPI  Past medical history: Past Medical History:  Diagnosis Date  . Clotting disorder (HCC)    DVT  . Diabetes mellitus   . DVT (deep venous thrombosis) ([place])   . Family history of polyps in the colon   . Fatty liver   . GERD (gastroesophageal reflux disease)   . History of  chicken pox   . Hyperlipidemia    no meds now 05-28-16  . Obesity   . Sleep apnea    not using CPAP at this time 05-28-16    Past surgical history: Past Surgical History:  Procedure Laterality Date  . COLONOSCOPY     2012, 2017  . LUMBAR DISC SURGERY     L4/L5  . PILONIDAL CYST EXCISION     2001    Family history:  Family History  Problem Relation Age of Onset  . Diabetes Mother   . Stroke Mother   . Diabetes Father   . Prostate cancer Father        dx'd in his early 1s  . Hyperlipidemia Brother   . Colon cancer Paternal Uncle        dx'd near age 72  . Diabetes Maternal Uncle   . Esophageal cancer Neg Hx   . Stomach cancer Neg Hx   . Rectal cancer Neg Hx     Social history: Lives in a home with carpeting in the bedroom with gas heating and central cooling.  2 cats in the home.  There is no concern for water damage, mildew or roaches in the home.  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  Jerry Eaton does report a smoking history of cigarettes 1 pack/day for the past 27 years.  Medication List:  Current Outpatient Medications  Medication Sig Dispense Refill  . ezetimibe (ZETIA) 10 MG tablet TAKE ONE TABLET BY MOUTH ONE TIME DAILY  30 tablet 3  . famotidine (PEPCID) 20 MG tablet Take 1 tablet (20 mg total) by mouth daily.    [name] glucose blood test strip Check sugar before and 2 hours after meals. Okay to check >3 times per day- insulin treated, uncontrolled DM2. 300 each 1  . INVOKANA 100 MG TABS tablet TAKE ONE TABLET BY MOUTH ONE TIME DAILY BEFORE BREAKFAST 90 tablet 0  . Lancets (ONETOUCH ULTRASOFT) lancets Check sugar before and 2 hours after meals. Okay to check >3 times per day- insulin treated, uncontrolled DM2. 300 each 1  . lisinopril (ZESTRIL) 2.5 MG tablet TAKE ONE TABLET BY MOUTH ONE TIME DAILY  90 tablet 2  . loratadine (CLARITIN) 10 MG tablet Take 1 tablet (10 mg total) by mouth daily. 30 tablet 12  . metFORMIN (GLUCOPHAGE) 1000 MG tablet  Take 2 tablets (2,000 mg total) by mouth at bedtime.    . Semaglutide, 1 MG/DOSE, (OZEMPIC, 1 MG/DOSE,) 4 MG/3ML SOPN Inject 0.75 mLs (1 mg total) into the skin once a week. 9 mL 3  . UNIFINE PENTIPS PLUS 32G X 4 MM MISC USE 1 PENTIP ONCE A DAY (Patient not taking: Reported on 07/20/2020) 100 each 2   No current facility-administered medications for this visit.    Known medication allergies: Allergies  Allergen Reactions  . Other Anaphylaxis    Bee stings  . Chantix [Varenicline Tartrate]     irritable  . Crestor [Rosuvastatin Calcium]     myalgia  . Januvia [Sitagliptin [place] in LFTs. Occurred with janumet, but prev tolerated metformin alone  . Lipitor [Atorvastatin Calcium]     myalgia     Physical examination: Blood pressure 128/80, pulse 98, temperature 97.9 F (36.6 C), resp. rate 14, height 6' (1.829 m), weight 289 lb (131.1 kg), SpO2 96 %.  General: Alert, interactive, in no acute distress. HEENT: PERRLA, TMs pearly gray, turbinates non-edematous without discharge, post-pharynx non erythematous. Neck: Supple without lymphadenopathy. Lungs: Clear to auscultation without wheezing, rhonchi or rales. {no increased work of breathing. CV: Normal S1, S2 without murmurs. Abdomen: Nondistended, nontender. Skin: Scattered erythematous urticarial type lesions primarily located neck. right upper arm with erythematous linear marks from scratching , nonvesicular. Extremities:  No clubbing, cyanosis or edema. Neuro:   Grossly intact.  Diagnositics/Labs:  Allergy testing: deferred due to urticaria  Assessment and plan:   Urticaria, chronic Dermatographia  -  at this time etiology of hives and swelling is unknown.  Hives can be caused by a variety of different triggers including illness/infection, foods, medications, stings, exercise, pressure, vibrations, extremes of temperature to name a few however majority of the time there is no identifiable trigger.  Your symptoms  have been ongoing for >6 weeks making this chronic thus will obtain labwork to evaluate: CBC w diff, CMP, tryptase, hive panel, environmental panel, alpha-gal panel  - for management of hives recommend high-dose antihistamine regimen as follows: Claritin 10mg  1 tab twice a day and Pepcid 20mg  1 tab twice a day  - let us know if the above regimen doesn't control hives enough and if not would add Singulair to the regimen.  If that still doesn't control hives enough then next step is starting Xolair monthly injections.  Xolair is an effective medication to management of hives refractory to high-dose antihistamines   Follow-up 3 months or  sooner if needed  I appreciate the opportunity to take part in Jerry Eaton's care. Please do not hesitate to contact me with questions.  Sincerely,   Prudy Feeler, MD Allergy/Immunology [facility]

## 2020-07-26 LAB — COMPREHENSIVE METABOLIC PANEL
ALT: 27 IU/L (ref 0–44)
AST: 24 IU/L (ref 0–40)
Albumin/Globulin Ratio: 1.4 (ref 1.2–2.2)
Albumin: 4.6 g/dL (ref 4.0–5.0)
Alkaline Phosphatase: 77 IU/L (ref 44–121)
BUN/Creatinine Ratio: 19 (ref 9–20)
BUN: 15 mg/dL (ref 6–24)
Bilirubin Total: 0.5 mg/dL (ref 0.0–1.2)
CO2: 24 mmol/L (ref 20–29)
Calcium: 9.8 mg/dL (ref 8.7–10.2)
Chloride: 102 mmol/L (ref 96–106)
Creatinine, Ser: 0.79 mg/dL (ref 0.76–1.27)
GFR calc Af Amer: 124 mL/min/{1.73_m2} (ref >59)
GFR calc non Af Amer: 108 mL/min/{1.73_m2} (ref >59)
Globulin, Total: 3.2 g/dL (ref 1.5–4.5)
Glucose: 128 mg/dL — ABNORMAL HIGH (ref 65–99)
Potassium: 5.2 mmol/L (ref 3.5–5.2)
Sodium: 138 mmol/L (ref 134–144)
Total Protein: 7.8 g/dL (ref 6.0–8.5)

## 2020-07-26 LAB — CBC WITH DIFFERENTIAL
Basophils Absolute: 0.1 10*3/uL (ref 0.0–0.2)
Basos: 1 %
EOS (ABSOLUTE): 0.1 10*3/uL (ref 0.0–0.4)
Eos: 2 %
Hematocrit: 49 % (ref 37.5–51.0)
Hemoglobin: 15.9 g/dL (ref 13.0–17.7)
Immature Grans (Abs): 0 10*3/uL (ref 0.0–0.1)
Immature Granulocytes: 0 %
Lymphocytes Absolute: 1.5 10*3/uL (ref 0.7–3.1)
Lymphs: 23 %
MCH: 27.1 pg (ref 26.6–33.0)
MCHC: 32.4 g/dL (ref 31.5–35.7)
MCV: 84 fL (ref 79–97)
Monocytes Absolute: 0.5 10*3/uL (ref 0.1–0.9)
Monocytes: 7 %
Neutrophils Absolute: 4.5 10*3/uL (ref 1.4–7.0)
Neutrophils: 67 %
RBC: 5.86 x10E6/uL — ABNORMAL HIGH (ref 4.14–5.80)
RDW: 13.7 % (ref 11.6–15.4)
WBC: 6.7 10*3/uL (ref 3.4–10.8)

## 2020-07-26 LAB — ALLERGENS W/TOTAL IGE AREA 2
Alternaria Alternata IgE: 0.29 kU/L — AB
Aspergillus Fumigatus IgE: 1.22 kU/L — AB
Bermuda Grass IgE: 1.79 kU/L — AB
Cat Dander IgE: <0.1 kU/L
Cedar, Mountain IgE: 1.23 kU/L — AB
Cladosporium Herbarum IgE: 0.56 kU/L — AB
Cockroach, German IgE: 0.93 kU/L — AB
Common Silver Birch IgE: 1.14 kU/L — AB
Cottonwood IgE: 1.51 kU/L — AB
D Farinae IgE: 1.35 kU/L — AB
D Pteronyssinus IgE: 2.09 kU/L — AB
Dog Dander IgE: <0.1 kU/L
Elm, American IgE: 1.69 kU/L — AB
IgE (Immunoglobulin E), Serum: 959 IU/mL — ABNORMAL HIGH (ref 6–495)
Johnson Grass IgE: 2 kU/L — AB
Maple/Box Elder IgE: 1.83 kU/L — AB
Mouse Urine IgE: <0.1 kU/L
Oak, White IgE: 1.75 kU/L — AB
Pecan, Hickory IgE: 1.29 kU/L — AB
Penicillium Chrysogen IgE: 4.5 kU/L — AB
Pigweed, Rough IgE: 1.47 kU/L — AB
Ragweed, Short IgE: 1.8 kU/L — AB
Sheep Sorrel IgE Qn: 1.92 kU/L — AB
Timothy Grass IgE: 2 kU/L — AB
White Mulberry IgE: 1.1 kU/L — AB

## 2020-07-26 LAB — CHRONIC URTICARIA: cu index: 1.6 (ref <10)

## 2020-07-26 LAB — ALPHA-GAL PANEL
Alpha Gal IgE*: <0.1 kU/L (ref <0.10)
Beef (Bos spp) IgE: <0.1 kU/L (ref <0.35)
Class Interpretation: 0
Class Interpretation: 0
Class Interpretation: 0
Lamb/Mutton (Ovis spp) IgE: <0.1 kU/L (ref <0.35)
Pork (Sus spp) IgE: <0.1 kU/L (ref <0.35)

## 2020-07-26 LAB — TRYPTASE: Tryptase: 7.4 ug/L (ref 2.2–13.2)

## 2020-07-26 LAB — THYROID PEROXIDASE ANTIBODY: Thyroperoxidase Ab SerPl-aCnc: 15 IU/mL (ref 0–34)

## 2020-08-29 ENCOUNTER — Other Ambulatory Visit: Payer: Self-pay

## 2020-08-29 ENCOUNTER — Encounter: Payer: Self-pay | Admitting: Internal Medicine

## 2020-08-29 ENCOUNTER — Ambulatory Visit (INDEPENDENT_AMBULATORY_CARE_PROVIDER_SITE_OTHER): Payer: 59 | Admitting: Internal Medicine

## 2020-08-29 VITALS — BP 122/80 | HR 87 | Ht 72.0 in | Wt 291.6 lb

## 2020-08-29 DIAGNOSIS — Z794 Long term (current) use of insulin: Secondary | ICD-10-CM

## 2020-08-29 DIAGNOSIS — E785 Hyperlipidemia, unspecified: Secondary | ICD-10-CM

## 2020-08-29 DIAGNOSIS — E669 Obesity, unspecified: Secondary | ICD-10-CM

## 2020-08-29 DIAGNOSIS — T466X5A Adverse effect of antihyperlipidemic and antiarteriosclerotic drugs, initial encounter: Secondary | ICD-10-CM | POA: Diagnosis not present

## 2020-08-29 DIAGNOSIS — E1140 Type 2 diabetes mellitus with diabetic neuropathy, unspecified: Secondary | ICD-10-CM | POA: Diagnosis not present

## 2020-08-29 LAB — POCT GLYCOSYLATED HEMOGLOBIN (HGB A1C): Hemoglobin A1C: 6.9 % — AB (ref 4.0–5.6)

## 2020-08-29 MED ORDER — METFORMIN HCL ER 500 MG PO TB24: 2000.0000 mg | ORAL_TABLET | Freq: Every day | ORAL | 3 refills | Status: DC

## 2020-08-29 MED ORDER — GLUCOSE BLOOD VI STRP
ORAL_STRIP | 3 refills | Status: DC
Start: 2020-08-29 — End: 2022-01-18

## 2020-08-29 MED ORDER — ONETOUCH ULTRASOFT LANCETS MISC
3 refills | Status: DC
Start: 2020-08-29 — End: 2022-01-18

## 2020-08-29 NOTE — Addendum Note (Signed)
Addended by: Lauralyn Primes on: 08/29/2020 09:18 AM   Modules accepted: Orders

## 2020-08-29 NOTE — Patient Instructions (Addendum)
Please move: - Metformin 2000 mg with b'fast and change to ER formulation  Please continue: - Invokana 100 mg daily before b'fast - Ozempic 1 mg weekly  Please return in 4 months with your sugar log.

## 2020-08-29 NOTE — Progress Notes (Signed)
Patient ID: Jerry Eaton, male   DOB: 1971-05-21, 49 y.o.   MRN: [id number]   This visit occurred during the SARS-CoV-2 public health emergency.  Safety protocols were in place, including screening questions prior to the visit, additional usage of staff PPE, and extensive cleaning of exam room while observing appropriate contact time as indicated for disinfecting solutions.   HPI: Jerry Eaton is a 49 y.o.-year-old male, returning for follow-up for DM2, dx in ~2013, insulin-dependent since 2016-2017, uncontrolled, with complications (DR, PN). Last visit 4 months ago.  Reviewed HbA1c levels: Lab Results  Component Value Date   HGBA1C 6.7 (A) 04/28/2020   HGBA1C 6.2 (A) 08/21/2019   HGBA1C 6.5 (A) 03/26/2019   Pt was on a regimen of: - Metformin 500 mg 1x a day, with meals (Rx'ed BID, forgets second dose) - Lantus 65 units at bedtime  Tried JanuMet >> transaminitis.  Now on: - Metformin 1000 mg 1-2x a day with meals (forgets evening dose) >> 2000 mg with dinner >> but forgetting doses - Invokana 100 mg daily in a.m. - Ozempic 1 mg weekly - >> stopped ~02/2019  He checks sugars 1-4x a week: - am: 96, 130-150 >> 125-147, 162 >> 128-157 - 2h after b'fast: 180-277 >> n/c >> 223 >> n/c - before lunch: 130-190 >> n/c >> 122-154 >> n/c - 2h after lunch: 160-180 >> n/c >> 124, 202 >> n/c - before dinner: 94, 111-153 >> n/c >> 191 >> n/c >> 171 - 2h after dinner: 150-250 >> n/c  >> 105, 126 >> n/c - bedtime: n/c - nighttime: n/c He has hypoglycemia awareness in the 70s. Lowest: 96 >> 125 >> 98 Highest: 200 >> 162 >> 200  Glucometer: OneTouch  Verio IQ;  One Touch Ultra 2  Pt's meals are: - Breakfast: bisquits - Lunch: sandwich, burgers (may skip) - Dinner: burgers, chicken, steak - Snacks: 0-1 He is still traveling a lot for work  -No CKD, last BUN/creatinine:  Lab Results  Component Value Date   BUN 15 07/20/2020   BUN 22 06/03/2020   CREATININE 0.79 07/20/2020    CREATININE 0.81 06/03/2020   ACR normal: Lab Results  Component Value Date   MICRALBCREAT 0.6 03/26/2019   MICRALBCREAT 4.6 07/14/2018   MICRALBCREAT 4.1 02/27/2017   MICRALBCREAT 0.6 09/19/2015   MICRALBCREAT 0.7 07/26/2014   On lisinopril 2.5.  -+ HL.  Last set of lipids: Lab Results  Component Value Date   CHOL 186 06/03/2020   HDL 30.60 (L) 06/03/2020   LDLCALC 135 (H) 06/03/2020   LDLDIRECT 163.5 05/16/2012   TRIG 102.0 06/03/2020   CHOLHDL 6 06/03/2020  Crestor, Lipitor, fluvastatin XL caused joint and muscle aches.  Co-Q10 was not helping.  Currently on Zetia without side effects.  -  last eye exam was: 09/2019: + DR  - + numbness and tingling in feet   He was traveling to the [place] once a year to visit his girlfriend before the coronavirus pandemic. His wife and daughter moved here from [place] in 09/2019.  ROS: Constitutional: no weight gain/no weight loss, no fatigue, no subjective hyperthermia, no subjective hypothermia Eyes: no blurry vision, no xerophthalmia ENT: no sore throat, no nodules palpated in neck, no dysphagia, no odynophagia, no hoarseness Cardiovascular: no CP/no SOB/no palpitations/no leg swelling Respiratory: no cough/no SOB/no wheezing Gastrointestinal: no N/no V/no D/no C/no acid reflux Musculoskeletal: no muscle aches/no joint aches Skin: no rashes, no hair loss Neurological: no tremors/+ numbness/+ tingling/no dizziness  I reviewed  pt's medications, allergies, PMH, social hx, family hx, and changes were documented in the history of present illness. Otherwise, unchanged from my initial visit note.  Past Medical History:  Diagnosis Date  . Clotting disorder (HCC)    DVT  . Diabetes mellitus   . DVT (deep venous thrombosis) ([place])   . Family history of polyps in the colon   . Fatty liver   . GERD (gastroesophageal reflux disease)   . History of chicken pox   . Hyperlipidemia    no meds now 05-28-16  . Obesity   . Sleep apnea     not using CPAP at this time 05-28-16   Past Surgical History:  Procedure Laterality Date  . COLONOSCOPY     2012, 2017  . LUMBAR DISC SURGERY     L4/L5  . PILONIDAL CYST EXCISION     2001   Social History   Social History  . Marital status: Single    Spouse name: N/A  . Number of children: 0   Social History Main Topics  . Smoking status: Current Every Day Smoker    Packs/day: 1.00    Years: 20.00    Types: Cigarettes  . Smokeless tobacco: Never Used  . Alcohol use Yes     Comment: 10-15 per week, usually on the weekends  . 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 ezetimibe (ZETIA) 10 MG tablet TAKE ONE TABLET BY MOUTH ONE TIME DAILY  30 tablet 3  . famotidine (PEPCID) 20 MG tablet Take 1 tablet (20 mg total) by mouth daily.    [name] glucose blood test strip Check sugar before and 2 hours after meals. Okay to check >3 times per day- insulin treated, uncontrolled DM2. 300 each 1  . INVOKANA 100 MG TABS tablet TAKE ONE TABLET BY MOUTH ONE TIME DAILY BEFORE BREAKFAST 90 tablet 0  . Lancets (ONETOUCH ULTRASOFT) lancets Check sugar before and 2 hours after meals. Okay to check >3 times per day- insulin treated, uncontrolled DM2. 300 each 1  . lisinopril (ZESTRIL) 2.5 MG tablet TAKE ONE TABLET BY MOUTH ONE TIME DAILY  90 tablet 2  . loratadine (CLARITIN) 10 MG tablet Take 1 tablet (10 mg total) by mouth daily. 30 tablet 12  . metFORMIN (GLUCOPHAGE) 1000 MG tablet Take 2 tablets (2,000 mg total) by mouth at bedtime.    . Semaglutide, 1 MG/DOSE, (OZEMPIC, 1 MG/DOSE,) 4 MG/3ML SOPN Inject 0.75 mLs (1 mg total) into the skin once a week. 9 mL 3  . UNIFINE PENTIPS PLUS 32G X 4 MM MISC USE 1 PENTIP ONCE A DAY (Patient not taking: Reported on 07/20/2020) 100 each 2   No current facility-administered medications on file  prior to visit.   Allergies  Allergen Reactions  . Other Anaphylaxis    Bee stings  . Chantix [Varenicline Tartrate]     irritable  . Crestor [Rosuvastatin Calcium]     myalgia  . Januvia [Sitagliptin [place] in LFTs. Occurred with janumet, but prev tolerated metformin alone  . Lipitor [Atorvastatin Calcium]     myalgia   Family History  Problem Relation Age of Onset  . Diabetes Mother   . Stroke Mother   . Diabetes Father   . Prostate cancer Father  dx'd in his early 49s  . Hyperlipidemia Brother   . Colon cancer Paternal Uncle        dx'd near age 58  . Diabetes Maternal Uncle   . Esophageal cancer Neg Hx   . Stomach cancer Neg Hx   . Rectal cancer Neg Hx   Pt has FH of DM in Mother, father, M uncle.   PE: BP 122/80   Pulse 87   Ht 6' (1.829 m)   Wt 291 lb 9.6 oz (132.3 kg)   SpO2 97%   BMI 39.55 kg/m  Wt Readings from Last 3 Encounters:  08/29/20 291 lb 9.6 oz (132.3 kg)  07/20/20 289 lb (131.1 kg)  06/03/20 287 lb 8 oz (130.4 kg)   Constitutional: overweight, in NAD Eyes: PERRLA, EOMI, no exophthalmos ENT: moist mucous membranes, no thyromegaly, no cervical lymphadenopathy Cardiovascular: RRR, No MRG Respiratory: CTA B Gastrointestinal: abdomen soft, NT, ND, BS+ Musculoskeletal: no deformities, strength intact in all 4 Skin: moist, warm, no rashes Neurological: no tremor with outstretched hands, DTR normal in all 4  ASSESSMENT: 1. DM2, insulin-dependent, uncontrolled, with complications - PN - DR  -He had a freestyle libre CGM in the past but this kept coming off, so he is not using it. -No personal history of pancreatitis or family history of medullary thyroid cancer  2. HL  3. Arthralgia due to statin therapy  4. Obesity class II  PLAN:  1. Patient with longstanding, previously uncontrolled type 2 diabetes, on oral medication regimen with Metformin, SGLT2 inhibitor, and weekly GLP-1 receptor agonist.  At last visit, after a  period in which he was forgetting his long-acting insulin, he stopped Lantus completely for approximately 2 months.  He noticed that his sugars were not much different.  At last visit, sugars are higher than target in the morning but he was also forgetting Metformin frequently at night.  We discussed about setting alarms on his phone to take Metformin with dinner but we did not restart his long-acting insulin.  HbA1c at that point was 6.7%, higher, but still at goal. -At today's visit, he is not checking sugars consistently and I strongly advised him to do so. He tolerates his medication well, but he forgets to take his Metformin at night. We discussed that it possibly would be better for him to take it in the morning but he is not sure whether he would tolerate it. Therefore, we will switch to the ER formulation and advised him to take it with breakfast. I do not feel that we need to escalate his regimen if he starts taking Metformin consistently. - I suggested to:  Patient Instructions  Please move: - Metformin 2000 mg with b'fast and change to ER formulation  Please continue: - Invokana 100 mg daily before b'fast - Ozempic 1 mg weekly  Please return in 4 months with your sugar log.   - we checked his HbA1c: 6.9% (slightly higher) - advised to check sugars at different times of the day -try to check 1x a day, rotating check times - refilled test strips and lancets - advised for yearly eye exams >> he is UTD and has an appointment coming up next month - return to clinic in 4 months   2. HL -Reviewed latest lipid panel from 05/2020: LDL above target, HDL low Lab Results  Component Value Date   CHOL 186 06/03/2020   HDL 30.60 (L) 06/03/2020   LDLCALC 135 (H) 06/03/2020   LDLDIRECT 163.5 05/16/2012  TRIG 102.0 06/03/2020   CHOLHDL 6 06/03/2020  -He has joint pains from statins, including from Lescol XL.  He is on Zetia 10 now.  He tolerates this well.  3. Arthralgia due to statin  therapy -Please see above  4.  Obesity class II -continue SGLT 2 inhibitor and GLP-1 receptor agonist which should also help with weight loss; also, Metformin is appetite suppressant long-term -He cut down eating out and stop drinking alcohol after the coronavirus pandemic started -At last visit, he gained approximately 3 pounds since the previous visit -gained 2 lbs since last OV  Philemon Kingdom, MD PhD [facility] Endocrinology

## 2020-09-13 ENCOUNTER — Other Ambulatory Visit: Payer: Self-pay | Admitting: Internal Medicine

## 2020-10-03 LAB — HM DIABETES EYE EXAM

## 2020-10-08 ENCOUNTER — Other Ambulatory Visit: Payer: Self-pay | Admitting: Internal Medicine

## 2020-10-08 ENCOUNTER — Other Ambulatory Visit: Payer: Self-pay | Admitting: Family Medicine

## 2020-10-08 DIAGNOSIS — E1140 Type 2 diabetes mellitus with diabetic neuropathy, unspecified: Secondary | ICD-10-CM

## 2020-10-13 ENCOUNTER — Ambulatory Visit: Payer: 59 | Admitting: Allergy

## 2020-10-13 ENCOUNTER — Other Ambulatory Visit: Payer: Self-pay | Admitting: Internal Medicine

## 2020-10-13 ENCOUNTER — Encounter: Payer: Self-pay | Admitting: Allergy

## 2020-10-13 ENCOUNTER — Other Ambulatory Visit: Payer: Self-pay

## 2020-10-13 VITALS — BP 126/80 | HR 90 | Temp 97.8°F | Resp 18 | Ht 72.0 in

## 2020-10-13 DIAGNOSIS — L501 Idiopathic urticaria: Secondary | ICD-10-CM | POA: Diagnosis not present

## 2020-10-13 NOTE — Patient Instructions (Addendum)
Hives, chronic  -  at this time etiology of hives and swelling is spontaneous/idiopathic.  Your lab work was all reassuring and normal but did show sensitivity to mold, dust mites, grass pollen, tree pollen, weed pollen and cockroach .  Hives can be caused by a variety of different triggers including illness/infection, foods, medications, stings, exercise, pressure, vibrations, extremes of temperature to name a few however majority of the time there is no identifiable trigger.   - for management of hives continue the following regimen: Claritin 10mg  1 tab once a day and Pepcid 20mg  1 tab once a day  -  Can try stopping Pepcid and see if Claritin alone keeps hives under control.  If not continue both medications daily   - let us know if the above regimen doesn't control hives enough and if not would add Singulair to the regimen.  If that still doesn't control hives enough then next step is starting Xolair monthly injections.  Xolair is an effective medication to management of hives refractory to high-dose antihistamines   Follow-up 6 months or sooner if needed

## 2020-10-13 NOTE — Progress Notes (Signed)
Follow-up Note  RE: ISHMEAL RORIE MRN: [id number] DOB: May 22, 1971 Date of Office Visit: 10/13/2020   History of present illness: JARVIN OGREN is a 49 y.o. male presenting today for follow-up of hives.  He was last seen in the office on 07/20/2020 by myself.  He states he has been doing well since his last visit without any major health changes, surgeries or hospitalizations.  He does have hives have been controlled on once a day Claritin and Pepcid.  He states he can tell if he misses a dose as he does get itchy and more flushed.  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  Review of systems: Review of Systems  Constitutional: Negative.   HENT: Negative.   Eyes: Negative.   Respiratory: Negative.   Cardiovascular: Negative.   Gastrointestinal: Negative.   Musculoskeletal: Negative.   Skin: Negative.   Neurological: Negative.     All other systems negative unless noted above in HPI  Past medical/social/surgical/family history have been reviewed and are unchanged unless specifically indicated below.  No changes  Medication List: Current Outpatient Medications  Medication Sig Dispense Refill  . ezetimibe (ZETIA) 10 MG tablet TAKE ONE TABLET BY MOUTH ONE TIME DAILY 30 tablet 3  . famotidine (PEPCID) 20 MG tablet Take 1 tablet (20 mg total) by mouth daily.    [name] glucose blood test strip Check 2x a day - for One Touch Ultra 2 200 each 3  . INVOKANA 100 MG TABS tablet TAKE ONE TABLET BY MOUTH ONE TIME DAILY before breakfast 90 tablet 0  . Lancets (ONETOUCH ULTRASOFT) lancets Check 2x a day - for One Touch Ultra 2 200 each 3  . lisinopril (ZESTRIL) 2.5 MG tablet TAKE ONE TABLET BY MOUTH ONE TIME DAILY 90 tablet 0  . loratadine (CLARITIN) 10 MG tablet Take 1 tablet (10 mg total) by mouth daily. 30 tablet 12  . metFORMIN (GLUCOPHAGE-XR) 500 MG 24 hr tablet Take 4 tablets (2,000 mg total) by  mouth daily with breakfast. 360 tablet 3  . Semaglutide, 1 MG/DOSE, (OZEMPIC, 1 MG/DOSE,) 4 MG/3ML SOPN Inject 0.75 mLs (1 mg total) into the skin once a week. 9 mL 3  . UNIFINE PENTIPS PLUS 32G X 4 MM MISC USE 1 PENTIP ONCE A DAY 100 each 2   No current facility-administered medications for this visit.     Known medication allergies: Allergies  Allergen Reactions  . Other Anaphylaxis    Bee stings  . Chantix [Varenicline Tartrate]     irritable  . Crestor [Rosuvastatin Calcium]     myalgia  . Januvia [Sitagliptin [place] in LFTs. Occurred with janumet, but prev tolerated metformin alone  . Lipitor [Atorvastatin Calcium]     myalgia     Physical examination: Blood pressure 126/80, pulse 90, temperature 97.8 F (36.6 C), temperature source Temporal, resp. rate 18, height 6' (1.829 m), SpO2 97 %.  General: Alert, interactive, in no acute distress. HEENT: PERRLA, TMs pearly gray, turbinates non-edematous without discharge, post-pharynx non erythematous. Neck: Supple without lymphadenopathy. Lungs: Clear to auscultation without wheezing, rhonchi or rales. {no increased work of breathing. CV: Normal S1, S2 without murmurs. Abdomen: Nondistended, nontender. Skin: Warm and dry, without lesions or rashes. Extremities:  No clubbing, cyanosis or edema. Neuro:   Grossly intact.  Diagnositics/Labs: Labs:  Component     Latest Ref Rng &  Units 07/20/2020  IgE (Immunoglobulin E), Serum     6 - 495 IU/mL 959 (H)  D Pteronyssinus IgE     Class III kU/L 2.09 (A)  D Farinae IgE     Class II kU/L 1.35 (A)  Cat Dander IgE     Class 0 kU/L <0.10  Dog Dander IgE     Class 0 kU/L <0.10  [place] Grass IgE     Class III kU/L 1.79 (A)  Timothy Grass IgE     Class III kU/L 2.00 (A)  [place] Grass IgE     Class III kU/L 2.00 (A)  Cockroach, German IgE     Class II kU/L 0.93 (A)  Penicillium Chrysogen IgE     Class IV kU/L 4.50 (A)  Cladosporium Herbarum IgE     Class II  kU/L 0.56 (A)  Aspergillus Fumigatus IgE     Class II kU/L 1.22 (A)  Alternaria Alternata IgE     Class 0/I kU/L 0.29 (A)  Maple/Box Elder IgE     Class III kU/L 1.83 (A)  Common Silver Charletta Cousin IgE     Class II kU/L 1.14 (A)  Cedar, Mountain IgE     Class II kU/L 1.23 (A)  Oak, White IgE     Class III kU/L 1.75 (A)  Elm, American IgE     Class III kU/L 1.69 (A)  Cottonwood IgE     Class III kU/L 1.51 (A)  Pecan, Hickory IgE     Class II kU/L 1.29 (A)  White Mulberry IgE     Class II kU/L 1.10 (A)  Ragweed, Short IgE     Class III kU/L 1.80 (A)  Pigweed, Rough IgE     Class III kU/L 1.47 (A)  Sheep Sorrel IgE Qn     Class III kU/L 1.92 (A)  Mouse Urine IgE     Class 0 kU/L <0.10  WBC     3.4 - 10.8 x10E3/uL 6.7  RBC     4.14 - 5.80 x10E6/uL 5.86 (H)  Hemoglobin     13.0 - 17.7 g/dL 18.8  HCT     41.6 - 60.6 % 49.0  MCV     79 - 97 fL 84  MCH     26.6 - 33.0 pg 27.1  MCHC     31.5 - 35.7 g/dL 30.1  RDW     60.1 - 09.3 % 13.7  Neutrophils     Not Estab. % 67  Lymphs     Not Estab. % 23  Monocytes     Not Estab. % 7  Eos     Not Estab. % 2  Basos     Not Estab. % 1  NEUT#     1.4 - 7.0 x10E3/uL 4.5  Lymphocyte #     0.7 - 3.1 x10E3/uL 1.5  Monocytes Absolute     0.1 - 0.9 x10E3/uL 0.5  EOS (ABSOLUTE)     0.0 - 0.4 x10E3/uL 0.1  Basophils Absolute     0.0 - 0.2 x10E3/uL 0.1  Immature Granulocytes     Not Estab. % 0  Immature Grans (Abs)     0.0 - 0.1 x10E3/uL 0.0  Glucose     65 - 99 mg/dL 235 (H)  BUN     6 - 24 mg/dL 15  Creatinine     5.73 - 1.27 mg/dL 2.20  GFR, Est Non African American     >59 mL/min/1.73 108  GFR, Est  African American     >59 mL/min/1.73 124  BUN/Creatinine Ratio     9 - 20 19  Sodium     134 - 144 mmol/L 138  Potassium     3.5 - 5.2 mmol/L 5.2  Chloride     96 - 106 mmol/L 102  CO2     20 - 29 mmol/L 24  Calcium     8.7 - 10.2 mg/dL 9.8  Total Protein     6.0 - 8.5 g/dL 7.8  Albumin     4.0 - 5.0 g/dL 4.6   Globulin, Total     1.5 - 4.5 g/dL 3.2  Albumin/Globulin Ratio     1.2 - 2.2 1.4  Total Bilirubin     0.0 - 1.2 mg/dL 0.5  Alkaline Phosphatase     44 - 121 IU/L 77  AST     0 - 40 IU/L 24  ALT     0 - 44 IU/L 27  Beef (Bos spp) IgE     <0.35 kU/L <0.10  Class Interpretation      0  Lamb/Mutton (Ovis spp) IgE     <0.35 kU/L <0.10  Class Interpretation      0  Pork (Sus spp) IgE     <0.35 kU/L <0.10  Class Interpretation      0  Alpha Gal IgE*     <0.10 kU/L <0.10  cu index     <10 1.6  Tryptase     2.2 - 13.2 ug/L 7.4  Thyroperoxidase Ab SerPl-aCnc     0 - 34 IU/mL 15   Assessment and plan: Urticaria, chronic idiopathic  -  at this time etiology of hives and swelling is spontaneous/idiopathic.  Your lab work was all reassuring and normal but did show sensitivity to mold, dust mites, grass pollen, tree pollen, weed pollen and cockroach .  Hives can be caused by a variety of different triggers including illness/infection, foods, medications, stings, exercise, pressure, vibrations, extremes of temperature to name a few however majority of the time there is no identifiable trigger.   - for management of hives continue the following regimen: Claritin 10mg  1 tab once a day and Pepcid 20mg  1 tab once a day  -  Can try stopping Pepcid and see if Claritin alone keeps hives under control.  If not continue both medications daily   - let us know if the above regimen doesn't control hives enough and if not would add Singulair to the regimen.  If that still doesn't control hives enough then next step is starting Xolair monthly injections.  Xolair is an effective medication to management of hives refractory to high-dose antihistamines   Follow-up 6 months or sooner if needed  I appreciate the opportunity to take part in Bolden's care. Please do not hesitate to contact me with questions.  Sincerely,   Prudy Feeler, MD Allergy/Immunology [facility]

## 2021-01-04 ENCOUNTER — Ambulatory Visit: Payer: 59 | Admitting: Internal Medicine

## 2021-01-13 ENCOUNTER — Ambulatory Visit: Payer: Self-pay | Admitting: Internal Medicine

## 2021-01-20 ENCOUNTER — Other Ambulatory Visit: Payer: Self-pay

## 2021-01-20 ENCOUNTER — Encounter: Payer: Self-pay | Admitting: Internal Medicine

## 2021-01-20 ENCOUNTER — Ambulatory Visit (INDEPENDENT_AMBULATORY_CARE_PROVIDER_SITE_OTHER): Payer: BC Managed Care – PPO | Admitting: Internal Medicine

## 2021-01-20 VITALS — BP 128/80 | HR 108 | Ht 72.0 in | Wt 293.6 lb

## 2021-01-20 DIAGNOSIS — E785 Hyperlipidemia, unspecified: Secondary | ICD-10-CM | POA: Diagnosis not present

## 2021-01-20 DIAGNOSIS — E1140 Type 2 diabetes mellitus with diabetic neuropathy, unspecified: Secondary | ICD-10-CM

## 2021-01-20 DIAGNOSIS — E669 Obesity, unspecified: Secondary | ICD-10-CM | POA: Diagnosis not present

## 2021-01-20 DIAGNOSIS — Z794 Long term (current) use of insulin: Secondary | ICD-10-CM

## 2021-01-20 LAB — POCT GLYCOSYLATED HEMOGLOBIN (HGB A1C): Hemoglobin A1C: 6.9 % — AB (ref 4.0–5.6)

## 2021-01-20 MED ORDER — LISINOPRIL 2.5 MG PO TABS: 2.5000 mg | ORAL_TABLET | Freq: Every day | ORAL | 3 refills | Status: DC

## 2021-01-20 MED ORDER — OZEMPIC (1 MG/DOSE) 4 MG/3ML ~~LOC~~ SOPN: 1.0000 mg | PEN_INJECTOR | SUBCUTANEOUS | 3 refills | Status: DC

## 2021-01-20 MED ORDER — FREESTYLE LIBRE 2 READER DEVI: 1.0000 | Freq: Every day | 0 refills | Status: DC

## 2021-01-20 MED ORDER — EZETIMIBE 10 MG PO TABS: 10.0000 mg | ORAL_TABLET | Freq: Every day | ORAL | 3 refills | Status: DC

## 2021-01-20 MED ORDER — FREESTYLE LIBRE 2 SENSOR MISC: 1.0000 | 3 refills | Status: DC

## 2021-01-20 MED ORDER — CANAGLIFLOZIN 100 MG PO TABS: ORAL_TABLET | ORAL | 3 refills | Status: DC

## 2021-01-20 NOTE — Progress Notes (Signed)
Patient ID: Jerry Eaton, male   DOB: [birth year], 50 y.o.   MRN: [id number]   This visit occurred during the SARS-CoV-2 public health emergency.  Safety protocols were in place, including screening questions prior to the visit, additional usage of staff PPE, and extensive cleaning of exam room while observing appropriate contact time as indicated for disinfecting solutions.   HPI: Jerry Eaton is a 50 y.o.-year-old male, returning for follow-up for DM2, dx in ~2013, insulin-dependent since 2016-2017, uncontrolled, with complications (DR, PN). Last visit 5 months ago.  Interim history: Since last visit, he has been having more knee pain (left knee). Otherwise, he has no new complaints: No blurry vision, increased urination, nausea.  Reviewed HbA1c levels: Lab Results  Component Value Date   HGBA1C 6.9 (A) 08/29/2020   HGBA1C 6.7 (A) 04/28/2020   HGBA1C 6.2 (A) 08/21/2019   HGBA1C 6.5 (A) 03/26/2019   HGBA1C 7.2 (A) 11/24/2018   HGBA1C 7.2 (A) 07/14/2018   HGBA1C 7.7 (H) 03/06/2018   HGBA1C 7.8 11/25/2017   HGBA1C 7.0 08/20/2017   HGBA1C 8.8 05/10/2017   HGBA1C 9.2 (H) 02/27/2017   HGBA1C 9.0 (H) 07/06/2016   HGBA1C 9.2 (H) 04/02/2016   HGBA1C 8.3 (H) 09/19/2015   HGBA1C 9.3 (H) 02/04/2015   HGBA1C 9.7 (H) 11/01/2014   HGBA1C 9.3 (H) 06/18/2014   HGBA1C 8.6 (H) 11/19/2013   HGBA1C 10.1 (H) 05/15/2013   HGBA1C 9.8 (H) 03/02/2013   Pt was on a regimen of: - Metformin 500 mg 1x a day, with meals (Rx'ed BID, forgets second dose) - Lantus 65 units at bedtime  Tried JanuMet >> transaminitis.  Now on: - Metformin 1000 mg 1-2x a day with meals >> 2000 mg with dinner >> but  Was forgetting doses >> Metformin ER 2000 mg with b'fast - Invokana 100 mg daily in a.m. - Ozempic 1 mg weekly - >> stopped ~02/2019  He checks sugars 1-4x a week: - am: 96, 130-150 >> 125-147, 162 >> 128-157 >> 146-172, 203 (missed meds) - 2h after b'fast: 180-277 >> n/c >> 223 >> n/c >> 204 - before  lunch: 130-190 >> n/c >> 122-154 >> n/c >> 149 - 2h after lunch: 160-180 >> n/c >> 124, 202 >> n/c - before dinner: 94, 111-153 >> n/c >> 191 >> n/c >> 171 >> n/c - 2h after dinner: 150-250 >> n/c  >> 105, 126 >> n/c - bedtime: n/c - nighttime: n/c He has hypoglycemia awareness in the 70s. Lowest: 96 >> 125 >> 98 Highest: 200 >> 162 >> 200  Glucometer: OneTouch  Verio IQ;  One Touch Ultra 2  Pt's meals are: - Breakfast: bisquits - Lunch: sandwich, burgers (may skip) - Dinner: burgers, chicken, steak - Snacks: 0-1 He is still traveling a lot for work  -No CKD, last BUN/creatinine:  Lab Results  Component Value Date   BUN 15 07/20/2020   BUN 22 06/03/2020   CREATININE 0.79 07/20/2020   CREATININE 0.81 06/03/2020   ACR normal: Lab Results  Component Value Date   MICRALBCREAT 0.6 03/26/2019   MICRALBCREAT 4.6 07/14/2018   MICRALBCREAT 4.1 02/27/2017   MICRALBCREAT 0.6 09/19/2015   MICRALBCREAT 0.7 07/26/2014   On lisinopril 2.5.  -+ HL.  Last set of lipids: Lab Results  Component Value Date   CHOL 186 06/03/2020   HDL 30.60 (L) 06/03/2020   LDLCALC 135 (H) 06/03/2020   LDLDIRECT 163.5 05/16/2012   TRIG 102.0 06/03/2020   CHOLHDL 6 06/03/2020  Crestor, Lipitor, fluvastatin  XL caused joint and muscle aches.  Co-Q10 was not helping.  Currently on Zetia without side effects.  -  last eye exam was: 09/2020: + DR  - + numbness and tingling in feet    He was traveling to the [place] once a year to visit his girlfriend before the coronavirus pandemic. His wife and daughter moved here from [place] in 09/2019.  ROS: Constitutional: + weight gain/no weight loss, no fatigue, no subjective hyperthermia, no subjective hypothermia Eyes: no blurry vision, no xerophthalmia ENT: no sore throat, no nodules palpated in neck, no dysphagia, no odynophagia, no hoarseness Cardiovascular: no CP/no SOB/no palpitations/no leg swelling Respiratory: no cough/no SOB/no  wheezing Gastrointestinal: no N/no V/no D/no C/no acid reflux Musculoskeletal: no muscle aches/+ joint aches Skin: no rashes, no hair loss Neurological: no tremors/+ numbness/+ tingling/no dizziness  I reviewed pt's medications, allergies, PMH, social hx, family hx, and changes were documented in the history of present illness. Otherwise, unchanged from my initial visit note.  Past Medical History:  Diagnosis Date  . Clotting disorder (HCC)    DVT  . Diabetes mellitus   . DVT (deep venous thrombosis) ([place])   . Family history of polyps in the colon   . Fatty liver   . GERD (gastroesophageal reflux disease)   . History of chicken pox   . Hyperlipidemia    no meds now 05-28-16  . Obesity   . Sleep apnea    not using CPAP at this time 05-28-16   Past Surgical History:  Procedure Laterality Date  . COLONOSCOPY     2012, 2017  . LUMBAR DISC SURGERY     L4/L5  . PILONIDAL CYST EXCISION     2001   Social History   Social History  . Marital status: Single    Spouse name: N/A  . Number of children: 0   Social History Main Topics  . Smoking status: Current Every Day Smoker    Packs/day: 1.00    Years: 20.00    Types: Cigarettes  . Smokeless tobacco: Never Used  . Alcohol use Yes     Comment: 10-15 per week, usually on the weekends  . 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 ezetimibe (ZETIA) 10 MG tablet TAKE ONE TABLET BY MOUTH ONE TIME DAILY 30 tablet 3  . famotidine (PEPCID) 20 MG tablet Take 1 tablet (20 mg total) by mouth daily.    [name] glucose blood test strip Check 2x a day - for One Touch Ultra 2 200 each 3  . INVOKANA 100 MG TABS tablet TAKE ONE TABLET BY MOUTH ONE TIME DAILY before breakfast 90 tablet 0  . Lancets (ONETOUCH ULTRASOFT) lancets Check 2x a day - for One Touch Ultra 2 200 each 3   . lisinopril (ZESTRIL) 2.5 MG tablet TAKE ONE TABLET BY MOUTH ONE TIME DAILY 90 tablet 0  . loratadine (CLARITIN) 10 MG tablet Take 1 tablet (10 mg total) by mouth daily. 30 tablet 12  . metFORMIN (GLUCOPHAGE-XR) 500 MG 24 hr tablet Take 4 tablets (2,000 mg total) by mouth daily with breakfast. 360 tablet 3  . Semaglutide, 1 MG/DOSE, (OZEMPIC, 1 MG/DOSE,) 4 MG/3ML SOPN Inject 0.75 mLs (1 mg total) into the skin once a week. 9 mL 3  . UNIFINE PENTIPS PLUS  32G X 4 MM MISC USE 1 PENTIP ONCE A DAY 100 each 2   No current facility-administered medications on file prior to visit.   Allergies  Allergen Reactions  . Other Anaphylaxis    Bee stings  . Chantix [Varenicline Tartrate]     irritable  . Crestor [Rosuvastatin Calcium]     myalgia  . Januvia [Sitagliptin [place] in LFTs. Occurred with janumet, but prev tolerated metformin alone  . Lipitor [Atorvastatin Calcium]     myalgia   Family History  Problem Relation Age of Onset  . Diabetes Mother   . Stroke Mother   . Diabetes Father   . Prostate cancer Father        dx'd in his early 76s  . Hyperlipidemia Brother   . Colon cancer Paternal Uncle        dx'd near age 21  . Diabetes Maternal Uncle   . Esophageal cancer Neg Hx   . Stomach cancer Neg Hx   . Rectal cancer Neg Hx   Pt has FH of DM in Mother, father, M uncle.   PE: BP 128/80 (BP Location: Right Arm, Patient Position: Sitting, Cuff Size: Large)   Pulse (!) 108   Ht 6' (1.829 m)   Wt 293 lb 9.6 oz (133.2 kg)   SpO2 96%   BMI 39.82 kg/m  Wt Readings from Last 3 Encounters:  01/20/21 293 lb 9.6 oz (133.2 kg)  08/29/20 291 lb 9.6 oz (132.3 kg)  07/20/20 289 lb (131.1 kg)   Constitutional: overweight, in NAD Eyes: PERRLA, EOMI, no exophthalmos ENT: moist mucous membranes, no thyromegaly, no cervical lymphadenopathy Cardiovascular: RRR, No MRG Respiratory: CTA B Gastrointestinal: abdomen soft, NT, ND, BS+ Musculoskeletal: no deformities, strength  intact in all 4 Skin: moist, warm, no rashes Neurological: no tremor with outstretched hands, DTR normal in all 4  ASSESSMENT: 1. DM2, insulin-dependent, uncontrolled, with complications - PN - DR  -He had a freestyle libre CGM in the past but this kept coming off, so he is not using it. -No personal history of pancreatitis or family history of medullary thyroid cancer  2. HL  3. Obesity class II  PLAN:  1. Patient with longstanding, previously uncontrolled type 2 diabetes, on Metformin, she ultimately gives her a GLP-1 receptor agonist, with improved control in the last 2 years, after the start of the coronavirus pandemic.  During the pandemic, he cut out eating out and increased alcohol intake.  At last visit, she was not checking sugars consistently and I strongly advised him to do so.  He was occasionally forgetting his Metformin at night so we switched to Metformin ER formulation and I advised him to take the entire dose with breakfast.  At that time, HbA1c was slightly higher, at 6.9%. -At this visit, we reviewed his meter downloads.  He still checks sugars only in the morning, and his sugars are all above target in the last 2 weeks.  Once or twice he did forget her medications at night, but this does not happen frequently.  It appears that his sugars are higher in the morning after we moved the Metformin dose in the morning so at today's visit, in an effort to avoid addition of basal insulin, I advised him to try his best not to forget to take the entire Metformin dose with dinner.  We also discussed about the importance of checking sugars later in the day to understand blood sugar patterns.  She agrees  to try the freestyle libre 2 CGM, and hopefully this is not covered by his new insurance.  This was coming off in the past due to increased sweating but also because she was hitting it.  I advised him to put it on the inside of his arm.  He would like to try this. - I suggested to:  Patient  Instructions  Please move: - Metformin ER 2000 mg with dinner  Continue: - Invokana 100 mg daily before b'fast - Ozempic 1 mg weekly  Check some sugars later in the day.  Try again to get the [place] CGM.  Please return in 4 months with your sugar log.   - we checked his HbA1c: 6.9% (higher than expected from his meter download) - advised to check sugars at different times of the day - 1x a day, rotating check times - advised for yearly eye exams >> he is UTD - return to clinic in 4 months   2. HL -Reviewed latest lipid panel from 05/2020: LDL above target, HDL low: Lab Results  Component Value Date   CHOL 186 06/03/2020   HDL 30.60 (L) 06/03/2020   LDLCALC 135 (H) 06/03/2020   LDLDIRECT 163.5 05/16/2012   TRIG 102.0 06/03/2020   CHOLHDL 6 06/03/2020  -He had joint pains from statins, including from Lescol XL.  She is now on Zetia 10 mg daily, tolerated well.  3.  Obesity class II -continue SGLT 2 inhibitor and GLP-1 receptor agonist which should also help with weight loss -He cut out eating out and stopped drinking alcohol after the coronavirus pandemic started but now weight started to increase again  -he gained 4 pounds in the last 6 months  Jerry Kingdom, MD PhD [facility]

## 2021-01-20 NOTE — Patient Instructions (Addendum)
Please move: - Metformin ER 2000 mg with dinner  Continue: - Invokana 100 mg daily before b'fast - Ozempic 1 mg weekly  Check some sugars later in the day.  Try again to get the [place] CGM.  Please return in 4 months with your sugar log.

## 2021-02-17 ENCOUNTER — Ambulatory Visit: Payer: Self-pay | Admitting: Internal Medicine

## 2021-02-23 ENCOUNTER — Telehealth: Payer: Self-pay | Admitting: Internal Medicine

## 2021-02-23 DIAGNOSIS — E1140 Type 2 diabetes mellitus with diabetic neuropathy, unspecified: Secondary | ICD-10-CM

## 2021-02-23 DIAGNOSIS — Z794 Long term (current) use of insulin: Secondary | ICD-10-CM

## 2021-02-23 NOTE — Telephone Encounter (Signed)
Patient called to advise that Ozempic is no longer covered by his insurance. It now costs him close to $775.  Would like another medication, maybe a generic?  I did ask him to call insurance to find out what they would cover in its place but he did not want to do that.

## 2021-02-23 NOTE — Telephone Encounter (Signed)
Hm, could we give him a coupon card?  Ozempic is the strongest so we would definitely be preferred to stay on it.  However, we can try to send Trulicity 4.5 mg weekly to his pharmacy instead, if Ozempic is absolutely impossible to obtain.

## 2021-02-24 NOTE — Telephone Encounter (Signed)
Called pt to inform of Dr. Arman Filter last note. No answer. LVM informed we do have savings cards if he wanted to go that route, but if he wanted to switch to Trulicity to call us back and let us know.

## 2021-03-06 NOTE — Telephone Encounter (Signed)
Pt calling to f/u on last message. Pharmacy said after applying for the discount card for the Ozempic that it was still 700 dollars for a 90 day supply. Patient would like a call back as soon as possible on what the options will be.

## 2021-03-07 MED ORDER — TRULICITY 1.5 MG/0.5ML ~~LOC~~ SOAJ: 1.5000 mg | SUBCUTANEOUS | 3 refills | Status: DC

## 2021-03-07 NOTE — Telephone Encounter (Signed)
Rx sent to preferred pharmacy.

## 2021-03-07 NOTE — Addendum Note (Signed)
Addended by: Lauralyn Primes on: 03/07/2021 05:07 PM   Modules accepted: Orders

## 2021-03-07 NOTE — Telephone Encounter (Signed)
Yes, let's send the Trulicity 4.5 mg instead. Ty! C

## 2021-03-07 NOTE — Telephone Encounter (Signed)
Called and advised pt Trulicicty can be sent to the pharmacy as an alternative to Eva.

## 2021-04-13 ENCOUNTER — Ambulatory Visit: Payer: 59 | Admitting: Allergy

## 2021-04-26 ENCOUNTER — Telehealth: Payer: Self-pay | Admitting: Pharmacy Technician

## 2021-04-26 NOTE — Telephone Encounter (Addendum)
Patient Advocate Encounter   Received notification from [facility] that prior authorization for [facility] is required.   PA submitted on 04/26/2021 Key [id number] Status is DENIED  SENT APPEAL 04/27/2021  DENIED  Provider changed to preferred [facility] will continue to follow.   Venida Jarvis. Nadara Mustard, CPhT Patient Advocate [facility] Phone: [phone] Fax:  [phone]

## 2021-04-27 ENCOUNTER — Other Ambulatory Visit: Payer: Self-pay | Admitting: Internal Medicine

## 2021-04-27 MED ORDER — DAPAGLIFLOZIN PROPANEDIOL 10 MG PO TABS: 10.0000 mg | ORAL_TABLET | Freq: Every day | ORAL | 3 refills | Status: DC

## 2021-06-02 ENCOUNTER — Encounter: Payer: Self-pay | Admitting: Family Medicine

## 2021-06-02 ENCOUNTER — Ambulatory Visit (INDEPENDENT_AMBULATORY_CARE_PROVIDER_SITE_OTHER): Payer: BC Managed Care – PPO | Admitting: Family Medicine

## 2021-06-02 ENCOUNTER — Other Ambulatory Visit: Payer: Self-pay

## 2021-06-02 VITALS — BP 118/80 | HR 93 | Temp 96.4°F | Ht 72.0 in | Wt 298.7 lb

## 2021-06-02 DIAGNOSIS — K1370 Unspecified lesions of oral mucosa: Secondary | ICD-10-CM

## 2021-06-02 MED ORDER — METFORMIN HCL ER 500 MG PO TB24: 1000.0000 mg | ORAL_TABLET | Freq: Two times a day (BID) | ORAL | Status: DC

## 2021-06-02 NOTE — Progress Notes (Signed)
This visit occurred during the SARS-CoV-2 public health emergency.  Safety protocols were in place, including screening questions prior to the visit, additional usage of staff PPE, and extensive cleaning of exam room while observing appropriate contact time as indicated for disinfecting solutions.  Oral lesion.  R sided. Present for about 2 weeks.  Not painful but he can feel it.  To the R of the uvula.  No FCNAVD.  No drainage.  No neck mass.  Swallowing well.  Smoking ~1 PPD, d/w pt.    Has endo f/u pending.  Has been using continual meter.  D/w pt about taking metformin.  Taking 2 tabs BID.    Meds, vitals, and allergies reviewed.   ROS: Per HPI unless specifically indicated in ROS section   Nad Ncat OP with MMM Tongue wnl.  Small whitish papule on the soft palate to R of uvula.  No ulceration.  No other lesions.   No LA.

## 2021-06-02 NOTE — Patient Instructions (Signed)
No change on visit.  We'll call about seeing ENT.  Take care.  Glad to see you.

## 2021-06-04 DIAGNOSIS — K1370 Unspecified lesions of oral mucosa: Secondary | ICD-10-CM | POA: Insufficient documentation

## 2021-06-04 NOTE — Assessment & Plan Note (Signed)
Discussed smoking cessation. Refer to ENT No charge for visit.

## 2021-06-06 ENCOUNTER — Ambulatory Visit (INDEPENDENT_AMBULATORY_CARE_PROVIDER_SITE_OTHER): Payer: BC Managed Care – PPO | Admitting: Internal Medicine

## 2021-06-06 ENCOUNTER — Other Ambulatory Visit: Payer: Self-pay

## 2021-06-06 ENCOUNTER — Encounter: Payer: Self-pay | Admitting: Internal Medicine

## 2021-06-06 VITALS — BP 128/82 | HR 90 | Ht 72.0 in | Wt 294.8 lb

## 2021-06-06 DIAGNOSIS — Z794 Long term (current) use of insulin: Secondary | ICD-10-CM

## 2021-06-06 DIAGNOSIS — E1140 Type 2 diabetes mellitus with diabetic neuropathy, unspecified: Secondary | ICD-10-CM | POA: Diagnosis not present

## 2021-06-06 DIAGNOSIS — E785 Hyperlipidemia, unspecified: Secondary | ICD-10-CM

## 2021-06-06 DIAGNOSIS — E669 Obesity, unspecified: Secondary | ICD-10-CM | POA: Diagnosis not present

## 2021-06-06 LAB — COMPREHENSIVE METABOLIC PANEL
ALT: 22 U/L (ref 0–53)
AST: 18 U/L (ref 0–37)
Albumin: 4.6 g/dL (ref 3.5–5.2)
Alkaline Phosphatase: 70 U/L (ref 39–117)
BUN: 21 mg/dL (ref 6–23)
CO2: 24 mEq/L (ref 19–32)
Calcium: 10 mg/dL (ref 8.4–10.5)
Chloride: 102 mEq/L (ref 96–112)
Creatinine, Ser: 0.81 mg/dL (ref 0.40–1.50)
GFR: 104.89 mL/min (ref >60.00)
Glucose, Bld: 159 mg/dL — ABNORMAL HIGH (ref 70–99)
Potassium: 4.6 mEq/L (ref 3.5–5.1)
Sodium: 136 mEq/L (ref 135–145)
Total Bilirubin: 0.4 mg/dL (ref 0.2–1.2)
Total Protein: 8 g/dL (ref 6.0–8.3)

## 2021-06-06 LAB — POCT GLYCOSYLATED HEMOGLOBIN (HGB A1C): Hemoglobin A1C: 7.5 % — AB (ref 4.0–5.6)

## 2021-06-06 LAB — MICROALBUMIN / CREATININE URINE RATIO
Creatinine,U: 122.4 mg/dL
Microalb Creat Ratio: 4.1 mg/g (ref 0.0–30.0)
Microalb, Ur: 5 mg/dL — ABNORMAL HIGH (ref 0.0–1.9)

## 2021-06-06 LAB — LIPID PANEL
Cholesterol: 198 mg/dL (ref 0–200)
HDL: 33.7 mg/dL — ABNORMAL LOW (ref >39.00)
LDL Cholesterol: 129 mg/dL — ABNORMAL HIGH (ref 0–99)
NonHDL: 164.1
Total CHOL/HDL Ratio: 6
Triglycerides: 178 mg/dL — ABNORMAL HIGH (ref 0.0–149.0)
VLDL: 35.6 mg/dL (ref 0.0–40.0)

## 2021-06-06 MED ORDER — ROSUVASTATIN CALCIUM 5 MG PO TABS: 5.0000 mg | ORAL_TABLET | Freq: Every day | ORAL | 3 refills | Status: DC

## 2021-06-06 MED ORDER — FREESTYLE LIBRE 2 READER DEVI: 1.0000 | Freq: Every day | 0 refills | Status: DC

## 2021-06-06 MED ORDER — TIRZEPATIDE 10 MG/0.5ML ~~LOC~~ SOAJ: 10.0000 mg | SUBCUTANEOUS | 3 refills | Status: DC

## 2021-06-06 NOTE — Progress Notes (Signed)
Patient ID: [name], male   DOB: April 07, 1971, 50 y.o.   MRN: [id number]   This visit occurred during the SARS-CoV-2 public health emergency.  Safety protocols were in place, including screening questions prior to the visit, additional usage of staff PPE, and extensive cleaning of exam room while observing appropriate contact time as indicated for disinfecting solutions.   HPI: Jerry Eaton is a 50 y.o.-year-old male, returning for follow-up for DM2, dx in ~2013, insulin-dependent since 2016-2017,  now off insulin, uncontrolled, with complications (DR, PN). Last visit 4.5 months ago.  Interim history: He continues to have left knee pain, but improved.  Otherwise, no increased urination, nausea, chest pain. Had an episode of blurry vision and dark spots in L eye while driving >> resolved in 15-20 min. He has been eating out a lot in the last 3 months.  Sugars have been higher.  He was able to start on the CGM, but this is not connected to Wi-Fi so we could not download it today. He has been forgetting many Metformin doses since last visit, but lately has been taking it more consistently. He visited [place] this summer.  Reviewed HbA1c levels: Lab Results  Component Value Date   HGBA1C 6.9 (A) 01/20/2021   HGBA1C 6.9 (A) 08/29/2020   HGBA1C 6.7 (A) 04/28/2020   HGBA1C 6.2 (A) 08/21/2019   HGBA1C 6.5 (A) 03/26/2019   HGBA1C 7.2 (A) 11/24/2018   HGBA1C 7.2 (A) 07/14/2018   HGBA1C 7.7 (H) 03/06/2018   HGBA1C 7.8 11/25/2017   HGBA1C 7.0 08/20/2017   HGBA1C 8.8 05/10/2017   HGBA1C 9.2 (H) 02/27/2017   HGBA1C 9.0 (H) 07/06/2016   HGBA1C 9.2 (H) 04/02/2016   HGBA1C 8.3 (H) 09/19/2015   HGBA1C 9.3 (H) 02/04/2015   HGBA1C 9.7 (H) 11/01/2014   HGBA1C 9.3 (H) 06/18/2014   HGBA1C 8.6 (H) 11/19/2013   HGBA1C 10.1 (H) 05/15/2013   Pt was on a regimen of: - Metformin 500 mg 1x a day, with meals (Rx'ed BID, forgets second dose) - Lantus 65 units at bedtime  Tried JanuMet >>  transaminitis.  Now on: - Metformin ER 2000 mg with b'fast >> w/ dinner >> 1000 mg 2x a day - Invokana 100 mg daily in a.m. >> Farxiga 10 mg in a.m. - Ozempic 1 mg weekly >> Trulicity 4.5 mg weekly He was previously on Tresiba 64 >> 40 units daily >> stopped ~02/2019  He checks sugars 4x a day - with the Libre CGM: - am: 125-147, 162 >> 128-157 >> 146-172, 203 (missed meds) >> 140s, 180 - 2h after b'fast: 180-277 >> n/c >> 223 >> n/c >> 204 >> 150-240 - before lunch: 130-190 >> n/c >> 122-154 >> n/c >> 149 >> 180 - 2h after lunch: 160-180 >> n/c >> 124, 202 >> n/c  - before dinner: 94, 111-153 >> n/c >> 191 >> n/c >> 171 >> n/c >> 140-50 - 2h after dinner: 150-250 >> n/c  >> 105, 126 >> n/c >> 200 - bedtime: n/c - nighttime: n/c He has hypoglycemia awareness in the 70s. Lowest: 96 >> 125 >> 98 >> 110 Highest: 200 >> 162 >> 200 >> 240  Glucometer: OneTouch  Verio IQ;  One Touch Ultra 2  Pt's meals are: - Breakfast: bisquits - Lunch: sandwich, burgers (may skip) - Dinner: burgers, chicken, steak - Snacks: 0-1 He is still traveling a lot for work.  -No CKD, last BUN/creatinine:  Lab Results  Component Value Date   BUN 15  07/20/2020   BUN 22 06/03/2020   CREATININE 0.79 07/20/2020   CREATININE 0.81 06/03/2020   ACR normal: Lab Results  Component Value Date   MICRALBCREAT 0.6 03/26/2019   MICRALBCREAT 4.6 07/14/2018   MICRALBCREAT 4.1 02/27/2017   MICRALBCREAT 0.6 09/19/2015   MICRALBCREAT 0.7 07/26/2014   On lisinopril 2.5.  -+ HL.  Last set of lipids: Lab Results  Component Value Date   CHOL 186 06/03/2020   HDL 30.60 (L) 06/03/2020   LDLCALC 135 (H) 06/03/2020   LDLDIRECT 163.5 05/16/2012   TRIG 102.0 06/03/2020   CHOLHDL 6 06/03/2020  Crestor, Lipitor, fluvastatin XL caused joint and muscle aches.  Co-Q10 was not helping.  Currently on Zetia without side effects.  -  last eye exam was: 09/2020: + DR  - + numbness and tingling in feet    His wife and  daughter moved here from [place] in 09/2019.  ROS: Constitutional: + weight gain/no weight loss, no fatigue, no subjective hyperthermia, no subjective hypothermia Eyes: no blurry vision, no xerophthalmia ENT: no sore throat, no nodules palpated in neck, no dysphagia, no odynophagia, no hoarseness Cardiovascular: no CP/no SOB/no palpitations/no leg swelling Respiratory: no cough/no SOB/no wheezing Gastrointestinal: no N/no V/no D/no C/no acid reflux Musculoskeletal: no muscle aches/+ improved joint aches Skin: no rashes, no hair loss Neurological: no tremors/+ numbness/+ tingling/no dizziness  I reviewed pt's medications, allergies, PMH, social hx, family hx, and changes were documented in the history of present illness. Otherwise, unchanged from my initial visit note.  Past Medical History:  Diagnosis Date   Clotting disorder ([place])    DVT   Diabetes mellitus    DVT (deep venous thrombosis) ([place])    Family history of polyps in the colon    Fatty liver    GERD (gastroesophageal reflux disease)    History of chicken pox    Hyperlipidemia    no meds now 05-28-16   Obesity    Sleep apnea    not using CPAP at this time 05-28-16   Past Surgical History:  Procedure Laterality Date   COLONOSCOPY     2012, 2017   LUMBAR [place] SURGERY     L4/L5   PILONIDAL CYST EXCISION     2001   Social History   Social History   Marital status: Single    Spouse name: N/A   Number of children: 0   Social History Main Topics   Smoking status: Current Every Day Smoker    Packs/day: 1.00    Years: 20.00    Types: Cigarettes   Smokeless tobacco: Never Used   Alcohol use Yes     Comment: 10-15 per week, usually on the weekends   Drug use: No   Social History Narrative   [employer] grad   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 1 each 0   Continuous Blood Gluc Sensor (FREESTYLE LIBRE 2 SENSOR) MISC 1 each by Does not apply route every 14 (fourteen) days. 6 each 3   dapagliflozin propanediol (FARXIGA) 10 MG TABS tablet Take 1 tablet (10 mg total) by mouth daily before breakfast. 90 tablet 3   Dulaglutide (TRULICITY) 1.5 0000000 SOPN Inject 1.5 mg into the skin once a week. 6 mL 3   ezetimibe (ZETIA) 10 MG  tablet Take 1 tablet (10 mg total) by mouth daily. 90 tablet 3   glucose blood test strip Check 2x a day - for One Touch Ultra 2 200 each 3   Lancets (ONETOUCH ULTRASOFT) lancets Check 2x a day - for One Touch Ultra 2 200 each 3   lisinopril (ZESTRIL) 2.5 MG tablet Take 1 tablet (2.5 mg total) by mouth daily. 90 tablet 3   loratadine (CLARITIN) 10 MG tablet Take 1 tablet (10 mg total) by mouth daily. 30 tablet 12   metFORMIN (GLUCOPHAGE-XR) 500 MG 24 hr tablet Take 2 tablets (1,000 mg total) by mouth in the morning and at bedtime.     UNIFINE PENTIPS PLUS 32G X 4 MM MISC USE 1 PENTIP ONCE A DAY 100 each 2   No current facility-administered medications on file prior to visit.   Allergies  Allergen Reactions   Other Anaphylaxis    Bee stings   Chantix [Varenicline Tartrate]     irritable   Crestor [Rosuvastatin Calcium]     myalgia   Januvia [Sitagliptin Phosphate]     Inc in LFTs. Occurred with janumet, but prev tolerated metformin alone   Lipitor [Atorvastatin Calcium]     myalgia   Family History  Problem Relation Age of Onset   Diabetes Mother    Stroke Mother    Diabetes Father    Prostate cancer Father        dx'd in his early 34s   Hyperlipidemia Brother    Colon cancer Paternal Uncle        dx'd near age 38   Diabetes Maternal Uncle    Esophageal cancer Neg Hx    Stomach cancer Neg Hx    Rectal cancer Neg Hx   Pt has FH of DM in Mother, father, M uncle.   PE: BP 128/82 (BP Location: Left Arm, Patient Position: Sitting, Cuff Size: Normal)    Pulse 90   Ht 6' (1.829 m)   Wt 294 lb 12.8 oz (133.7 kg)   SpO2 96%   BMI 39.98 kg/m  Wt Readings from Last 3 Encounters:  06/06/21 294 lb 12.8 oz (133.7 kg)  06/02/21 298 lb 11.2 oz (135.5 kg)  01/20/21 293 lb 9.6 oz (133.2 kg)   Constitutional: overweight, in NAD Eyes: PERRLA, EOMI, no exophthalmos ENT: moist mucous membranes, no thyromegaly, no cervical lymphadenopathy Cardiovascular: RRR, No MRG Respiratory: CTA B Gastrointestinal: abdomen soft, NT, ND, BS+ Musculoskeletal: no deformities, strength intact in all 4 Skin: moist, warm, no rashes Neurological: no tremor with outstretched hands, DTR normal in all 4  ASSESSMENT: 1. DM2, now noninsulin-dependent, uncontrolled, with complications - PN - DR  -He had a freestyle libre CGM in the past but this kept coming off -No personal history of pancreatitis or family history of medullary thyroid cancer  2. HL  3. Obesity class II  PLAN:  1. Patient with longstanding, previously uncontrolled type 2 diabetes, on metformin, SGLT2 inhibitor, and GLP-1 receptor agonist, with improved control in the last 2 years, after the start of the coronavirus pandemic.  During the pandemic, he cut down eating out and alcohol.  At last visit, HbA1c was stable, at goal, at 6.9%, but higher than expected from his log.  Sugars were above target in the morning in the previous 2 weeks so I advised him to move metformin from morning to dinnertime.  He was forgetting doses so we discussed about trying his best to take it with every dinner.  We again  discussed about retrying a freestyle libre 2 CGM.  He had a freestyle libre 14 days before, which was coming off due to increased sweating and also hitting his arm.  I advised him to put it on the inside of his arm.  I sent the prescription for the CGM to his pharmacy. -Since last visit, per insurance preference, unfortunately, he had to switch from [place] to [place] and from [place] to [place]. -At  today's visit, sugars are higher, especially after breakfast in the last 2 weeks.  Upon questioning, he is eating out more and feels that this has increased his blood sugars.  Discussed about cutting down eating out.  He also mentions that his co-pay for Trulicity is quite high and we discussed about switching to [facility].  Discussed that this is a stronger medication and he may expect some nausea.  Given Mounjaro coupon card.  Otherwise, we can continue with the same doses of metformin and Farxiga.  He is doing better with remembering to take metformin recently. - I suggested to:  Patient Instructions  Please continue: - Metformin ER 1000 mg 2x a day - Farxiga 10 mg daily before b'fast  Please change: - Trulicity to Mounjaro 10 mg weekly  Please return in 3-4 months.  - we checked his HbA1c: 7.5% (higher) - advised to check sugars at different times of the day - 4x a day, rotating check times - advised for yearly eye exams >> he is UTD -discussed that if he has another episode of blurry vision, he needs to schedule an appointment with his eye doctor right away - return to clinic in 4 months   2. HL -Reviewed latest lipid panel from 05/2020: LDL above target, HDL low: Lab Results  Component Value Date   CHOL 186 06/03/2020   HDL 30.60 (L) 06/03/2020   LDLCALC 135 (H) 06/03/2020   LDLDIRECT 163.5 05/16/2012   TRIG 102.0 06/03/2020   CHOLHDL 6 06/03/2020  -He had joint pains from statins, including from [place] XL.  He is now on Zetia 10 mg daily, tolerated well. -He is due for another lipid panel-we will check today (fasting)  3.  Obesity class II -continue SGLT 2 inhibitor and GLP-1 receptor agonist which should also help with weight loss -He cut out eating out and stopped drinking alcohol after the coronavirus pandemic started but weight started to increase again at last visit -Before last visit, he gained 4 pounds and gained 5 pounds since then but was able to lose 4 pounds  recently  Component     Latest Ref Rng & Units 06/06/2021  Sodium     135 - 145 mEq/L 136  Potassium     3.5 - 5.1 mEq/L 4.6  Chloride     96 - 112 mEq/L 102  CO2     19 - 32 mEq/L 24  Glucose     70 - 99 mg/dL 159 (H)  BUN     6 - 23 mg/dL 21  Creatinine     0.40 - 1.50 mg/dL 0.81  Total Bilirubin     0.2 - 1.2 mg/dL 0.4  Alkaline Phosphatase     39 - 117 U/L 70  AST     0 - 37 U/L 18  ALT     0 - 53 U/L 22  Total Protein     6.0 - 8.3 g/dL 8.0  Albumin     3.5 - 5.2 g/dL 4.6  Calcium     8.4 -  10.5 mg/dL 10.0  GFR     >60.00 mL/min 104.89  Cholesterol     0 - 200 mg/dL 198  Triglycerides     0.0 - 149.0 mg/dL 178.0 (H)  HDL Cholesterol     >39.00 mg/dL 33.70 (L)  VLDL     0.0 - 40.0 mg/dL 35.6  LDL (calc)     0 - 99 mg/dL 129 (H)  Total CHOL/HDL Ratio      6  NonHDL      164.10  Microalb, Ur     0.0 - 1.9 mg/dL 5.0 (H)  Creatinine,U     mg/dL 122.4  MICROALB/CREAT RATIO     0.0 - 30.0 mg/g 4.1   LDL is above target, as are his triglycerides.  He will definitely benefit from retrying a statin.  I would suggest a low-dose of Crestor, 5 mg weekly.  I will check if he wants to start.  Philemon Kingdom, MD PhD [facility]

## 2021-06-06 NOTE — Patient Instructions (Addendum)
Please continue: - Metformin ER 1000 mg 2x a day - Farxiga 10 mg daily before b'fast  Please change: - Trulicity to Mounjaro 10 mg weekly  Please return in 3-4 months.

## 2021-06-09 ENCOUNTER — Telehealth: Payer: Self-pay | Admitting: Family Medicine

## 2021-06-09 NOTE — Telephone Encounter (Signed)
Do you mind following up on this for me

## 2021-06-09 NOTE — Telephone Encounter (Signed)
Patient called in requesting info about ENT referral . Stated he has not heard anything . And would like a call back

## 2021-06-12 ENCOUNTER — Telehealth: Payer: Self-pay | Admitting: Pharmacy Technician

## 2021-06-12 ENCOUNTER — Other Ambulatory Visit (HOSPITAL_COMMUNITY): Payer: Self-pay

## 2021-06-12 NOTE — Telephone Encounter (Signed)
Patient Advocate Encounter   Received notification from [employer]Cover[employer]eds that prior authorization for [facility] is required.   PA submitted on 06/12/21  Key [id number] Status is approved 05/13/2021-06/12/2022   [place] and they were able to process. They will inform the pt.    Armanda Magic, CPhT Patient Advocate [facility] Phone: [phone] Fax:  [phone]

## 2021-06-13 NOTE — Telephone Encounter (Signed)
Referral was faxed over to   [facility]  (Located in: [facility]) Address: [address], [place], [place] [address] Phone: [phone]   Pt can call to schedule their appt or can wait for ENT to reach out to them to schedule. I am not sure how far behind they are but they do contact patients once they review the referrals.   Thanks!

## 2021-06-14 NOTE — Telephone Encounter (Signed)
LMTCB

## 2021-06-19 NOTE — Telephone Encounter (Signed)
Called patient and he stated he did get a call from ENT last week and he will call them back to schedule.

## 2021-06-28 DIAGNOSIS — F17210 Nicotine dependence, cigarettes, uncomplicated: Secondary | ICD-10-CM | POA: Diagnosis not present

## 2021-06-28 DIAGNOSIS — D3709 Neoplasm of uncertain behavior of other specified sites of the oral cavity: Secondary | ICD-10-CM | POA: Diagnosis not present

## 2021-08-18 DIAGNOSIS — D485 Neoplasm of uncertain behavior of skin: Secondary | ICD-10-CM | POA: Diagnosis not present

## 2021-08-18 DIAGNOSIS — D225 Melanocytic nevi of trunk: Secondary | ICD-10-CM | POA: Diagnosis not present

## 2021-08-18 DIAGNOSIS — Z1283 Encounter for screening for malignant neoplasm of skin: Secondary | ICD-10-CM | POA: Diagnosis not present

## 2021-08-18 DIAGNOSIS — B078 Other viral warts: Secondary | ICD-10-CM | POA: Diagnosis not present

## 2021-09-01 DIAGNOSIS — D485 Neoplasm of uncertain behavior of skin: Secondary | ICD-10-CM | POA: Diagnosis not present

## 2021-09-01 DIAGNOSIS — L988 Other specified disorders of the skin and subcutaneous tissue: Secondary | ICD-10-CM | POA: Diagnosis not present

## 2021-09-04 ENCOUNTER — Other Ambulatory Visit: Payer: Self-pay | Admitting: Internal Medicine

## 2021-09-18 DIAGNOSIS — D3709 Neoplasm of uncertain behavior of other specified sites of the oral cavity: Secondary | ICD-10-CM | POA: Diagnosis not present

## 2021-10-06 ENCOUNTER — Ambulatory Visit (INDEPENDENT_AMBULATORY_CARE_PROVIDER_SITE_OTHER): Payer: BC Managed Care – PPO | Admitting: Internal Medicine

## 2021-10-06 ENCOUNTER — Other Ambulatory Visit: Payer: Self-pay

## 2021-10-06 ENCOUNTER — Encounter: Payer: Self-pay | Admitting: Internal Medicine

## 2021-10-06 VITALS — BP 120/74 | HR 76 | Ht 72.0 in | Wt 286.2 lb

## 2021-10-06 DIAGNOSIS — Z794 Long term (current) use of insulin: Secondary | ICD-10-CM

## 2021-10-06 DIAGNOSIS — E785 Hyperlipidemia, unspecified: Secondary | ICD-10-CM | POA: Diagnosis not present

## 2021-10-06 DIAGNOSIS — E1140 Type 2 diabetes mellitus with diabetic neuropathy, unspecified: Secondary | ICD-10-CM

## 2021-10-06 DIAGNOSIS — E669 Obesity, unspecified: Secondary | ICD-10-CM

## 2021-10-06 LAB — LIPID PANEL
Cholesterol: 133 mg/dL (ref 0–200)
HDL: 34.4 mg/dL — ABNORMAL LOW (ref >39.00)
LDL Cholesterol: 76 mg/dL (ref 0–99)
NonHDL: 98.65
Total CHOL/HDL Ratio: 4
Triglycerides: 114 mg/dL (ref 0.0–149.0)
VLDL: 22.8 mg/dL (ref 0.0–40.0)

## 2021-10-06 LAB — POCT GLYCOSYLATED HEMOGLOBIN (HGB A1C): Hemoglobin A1C: 6.6 % — AB (ref 4.0–5.6)

## 2021-10-06 MED ORDER — FREESTYLE LIBRE 3 SENSOR MISC: 1.0000 | 3 refills | Status: DC

## 2021-10-06 NOTE — Progress Notes (Signed)
Patient ID: Jerry Eaton, male   DOB: [birth year], 50 y.o.   MRN: [id number]   This visit occurred during the SARS-CoV-2 public health emergency.  Safety protocols were in place, including screening questions prior to the visit, additional usage of staff PPE, and extensive cleaning of exam room while observing appropriate contact time as indicated for disinfecting solutions.   HPI: Jerry Eaton is a 50 y.o.-year-old male, returning for follow-up for DM2, dx in ~2013, insulin-dependent since 2016-2017,  now off insulin, uncontrolled, with complications (DR, PN). Last visit 4 months ago.  Interim history: No increased urination, blurry vision, nausea, chest pain. He has more cramping in his leg recently. He had left knee pain >> resolved. Before last visit, he relaxed his diet and sugars increased.  He is now doing a better job with this.  Reviewed HbA1c levels: Lab Results  Component Value Date   HGBA1C 7.5 (A) 06/06/2021   HGBA1C 6.9 (A) 01/20/2021   HGBA1C 6.9 (A) 08/29/2020   HGBA1C 6.7 (A) 04/28/2020   HGBA1C 6.2 (A) 08/21/2019   HGBA1C 6.5 (A) 03/26/2019   HGBA1C 7.2 (A) 11/24/2018   HGBA1C 7.2 (A) 07/14/2018   HGBA1C 7.7 (H) 03/06/2018   HGBA1C 7.8 11/25/2017   HGBA1C 7.0 08/20/2017   HGBA1C 8.8 05/10/2017   HGBA1C 9.2 (H) 02/27/2017   HGBA1C 9.0 (H) 07/06/2016   HGBA1C 9.2 (H) 04/02/2016   HGBA1C 8.3 (H) 09/19/2015   HGBA1C 9.3 (H) 02/04/2015   HGBA1C 9.7 (H) 11/01/2014   HGBA1C 9.3 (H) 06/18/2014   HGBA1C 8.6 (H) 11/19/2013   Pt was on a regimen of: - Metformin 500 mg 1x a day, with meals (Rx'ed BID, forgets second dose) - Lantus 65 units at bedtime  Tried JanuMet >> transaminitis.  Now on: - Metformin ER 2000 mg with b'fast >> w/ dinner >> 1000 mg 2x a day - Invokana 100 mg daily in a.m. >> Farxiga 10 mg in a.m. - Ozempic 1 mg weekly >> Trulicity 4.5 mg weekly >> Mounjaro 10 mg weekly - had GERD  >> resolved He was previously on Tresiba 64 >> 40 units daily >>  stopped ~02/2019  He checks sugars >4x a day - with the Libre CGM:   Previously: - am: 128-157 >> 146-172, 203 (missed meds) >> 140s, 180 - 2h after b'fast: 223 >> n/c >> 204 >> 150-240 - before lunch: 1n/c >> 122-154 >> n/c >> 149 >> 180 - 2h after lunch: 160-180 >> n/c >> 124, 202 >> n/c  - before dinner: 191 >> n/c >> 171 >> n/c >> 140-50 - 2h after dinner: 150-250 >> n/c  >> 105, 126 >> n/c >> 200 - bedtime: n/c - nighttime: n/c He has hypoglycemia awareness in the 70s. Lowest: 96 >> 125 >> 98 >> 110 >> 90s, 110 Highest: 200 >> 162 >> 200 >> 240 >> 170  Glucometer: OneTouch  Verio IQ;  One Touch Ultra 2  Pt's meals are: - Breakfast: bisquits - Lunch: sandwich, burgers (may skip) - Dinner: burgers, chicken, steak - Snacks: 0-1 He is still traveling a lot for work.  -No CKD, last BUN/creatinine:  Lab Results  Component Value Date   BUN 21 06/06/2021   BUN 15 07/20/2020   CREATININE 0.81 06/06/2021   CREATININE 0.79 07/20/2020   ACR normal: Lab Results  Component Value Date   MICRALBCREAT 4.1 06/06/2021   MICRALBCREAT 0.6 03/26/2019   MICRALBCREAT 4.6 07/14/2018   MICRALBCREAT 4.1 02/27/2017   MICRALBCREAT 0.6 09/19/2015  MICRALBCREAT 0.7 07/26/2014   On lisinopril 2.5.  -+ HL.  Last set of lipids: Lab Results  Component Value Date   CHOL 198 06/06/2021   HDL 33.70 (L) 06/06/2021   LDLCALC 129 (H) 06/06/2021   LDLDIRECT 163.5 05/16/2012   TRIG 178.0 (H) 06/06/2021   CHOLHDL 6 06/06/2021  Crestor, Lipitor, fluvastatin XL caused joint and muscle aches.  Co-Q10 was not helping.  Currently on Zetia without side effects.  -  last eye exam was: 09/2020: + DR. Coming up next month.  - + numbness and tingling in feet    His wife and daughter moved here from [place] in 09/2019.  ROS: + see HPI Neurological: no tremors/+ numbness/+ tingling/no dizziness  I reviewed pt's medications, allergies, PMH, social hx, family hx, and changes were documented in the  history of present illness. Otherwise, unchanged from my initial visit note.  Past Medical History:  Diagnosis Date   Clotting disorder ([place])    DVT   Diabetes mellitus    DVT (deep venous thrombosis) ([place])    Family history of polyps in the colon    Fatty liver    GERD (gastroesophageal reflux disease)    History of chicken pox    Hyperlipidemia    no meds now 05-28-16   Obesity    Sleep apnea    not using CPAP at this time 05-28-16   Past Surgical History:  Procedure Laterality Date   COLONOSCOPY     2012, 2017   [place] SURGERY     L4/L5   PILONIDAL CYST EXCISION     2001   Social History   Social History   Marital status: Single    Spouse name: N/A   Number of children: 0   Social History Main Topics   Smoking status: Current Every Day Smoker    Packs/day: 1.00    Years: 20.00    Types: Cigarettes   Smokeless tobacco: Never Used   Alcohol use Yes     Comment: 10-15 per week, usually on the weekends   Drug use: No   Social History Narrative   Social research officer, 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 6 each 3   dapagliflozin propanediol (FARXIGA) 10 MG TABS tablet Take 1 tablet (10 mg total) by mouth daily before breakfast. 90 tablet 3   ezetimibe (ZETIA) 10 MG tablet Take 1 tablet (10 mg total) by mouth daily. 90 tablet 3   glucose blood test strip Check 2x a day - for One Touch Ultra 2 200 each 3   Lancets (ONETOUCH ULTRASOFT) lancets Check 2x a day - for One Touch Ultra 2 200 each 3   lisinopril (ZESTRIL) 2.5 MG tablet Take 1 tablet (2.5 mg total) by mouth daily. 90 tablet 3   loratadine (CLARITIN) 10 MG tablet Take 1 tablet (10 mg  total) by mouth daily. 30 tablet 12   metFORMIN (GLUCOPHAGE-XR) 500 MG 24 hr tablet Take 2 tablets (1,000 mg total) by mouth in the morning and at bedtime.     rosuvastatin (CRESTOR) 5 MG tablet Take 1 tablet (5 mg total) by mouth daily. 90 tablet  3   tirzepatide (MOUNJARO) 10 MG/0.5ML Pen Inject 10 mg into the skin once a week. 6 mL 3   UNIFINE PENTIPS PLUS 32G X 4 MM MISC USE 1 PENTIP ONCE A DAY 100 each 2   No current facility-administered medications on file prior to visit.   Allergies  Allergen Reactions   Other Anaphylaxis    Bee stings   Chantix [Varenicline Tartrate]     irritable   Crestor [Rosuvastatin Calcium]     myalgia   Januvia [Sitagliptin Phosphate]     Inc in LFTs. Occurred with janumet, but prev tolerated metformin alone   Lipitor [Atorvastatin Calcium]     myalgia   Family History  Problem Relation Age of Onset   Diabetes Mother    Stroke Mother    Diabetes Father    Prostate cancer Father        dx'd in his early 2s   Hyperlipidemia Brother    Colon cancer Paternal Uncle        dx'd near age 55   Diabetes Maternal Uncle    Esophageal cancer Neg Hx    Stomach cancer Neg Hx    Rectal cancer Neg Hx   Pt has FH of DM in Mother, father, M uncle.   PE: BP 120/74 (BP Location: Left Arm, Patient Position: Sitting, Cuff Size: Normal)    Pulse 76    Ht 6' (1.829 m)    Wt 286 lb 3.2 oz (129.8 kg)    SpO2 99%    BMI 38.82 kg/m  Wt Readings from Last 3 Encounters:  10/06/21 286 lb 3.2 oz (129.8 kg)  06/06/21 294 lb 12.8 oz (133.7 kg)  06/02/21 298 lb 11.2 oz (135.5 kg)   Constitutional: overweight, in NAD Eyes: PERRLA, EOMI, no exophthalmos ENT: moist mucous membranes, no thyromegaly, no cervical lymphadenopathy Cardiovascular: RRR, No MRG Respiratory: CTA B Musculoskeletal: no deformities, strength intact in all 4 Skin: moist, warm, no rashes Neurological: no tremor with outstretched hands, DTR normal in all 4  ASSESSMENT: 1. DM2, now  noninsulin-dependent, uncontrolled, with complications - PN - DR  -He had a freestyle libre CGM in the past but this kept coming off -No personal history of pancreatitis or family history of medullary thyroid cancer  2. HL  3. Obesity class II  PLAN:  1. Patient with longstanding, uncontrolled, type 2 diabetes, on metformin, SGLT2 inhibitor, and weekly GLP-1 receptor agonist, with improved control in the last 2 years, after the start of the coronavirus pandemic.  During the pandemic, he cut out eating out and also cut out alcohol.  At last visit, he relaxed his diet and HbA1c was higher, at 7.5%.  At that time, sugars are higher, especially after breakfast, and especially after starting to eating out I suggested to switch from Trulicity to [place].  We previously had to switch from Ozempic to [employer] per insurance preference.  Also, for the same reason, we had to switch from [place] to [place]. CGM interpretation: -At today's visit, we reviewed his CGM downloads: It appears that 99% of values are in target range (goal >70%), while 1% are higher than 180 (goal <25%), and 0% are lower than 70 (goal <4%).  The calculated average blood sugar is 128.   -Reviewing the CGM trends, sugars appear to be excellent throughout the day.  We do have loss of data from approximately 8 AM to 2 PM and from 5 PM to 12 AM.  However, reviewing the individual day traces, his  sugars appear to be well controlled in the days for which he is able to scan the device.  At this visit, to avoid future loss of data, I suggested to use the freestyle libre 3.  Sent prescription to his pharmacy.  Due to improved control and excellent blood sugars, will continue the same regimen for now.  Of note, he did have GERD which Mounjaro when he started the medication, but now he tolerates it well. - I suggested to:  Patient Instructions  Please continue: - Metformin ER 1000 mg 2x a day - Farxiga 10 mg daily before b'fast - Mounjaro 10  mg weekly  Please return in 4 months.  - we checked his HbA1c: 6.6% (improved) - advised to check sugars at different times of the day - 4x a day, rotating check times - advised for yearly eye exams >> he is UTD -pending next month - return to clinic in 4 months   2. HL -Reviewed latest lipid panel from 05/2021: LDL above target, as were his triglycerides, HDL low: Lab Results  Component Value Date   CHOL 198 06/06/2021   HDL 33.70 (L) 06/06/2021   LDLCALC 129 (H) 06/06/2021   LDLDIRECT 163.5 05/16/2012   TRIG 178.0 (H) 06/06/2021   CHOLHDL 6 06/06/2021  -He had joint pains from statins, including from [place] XL.  He continues on Zetia 10 mg daily, tolerated well.  At last visit, I suggested to try Crestor 5 mg daily.  She did start.  This is tolerated well, with only occasional muscle cramps. -We will recheck a lipid level today  3.  Obesity class II -continue SGLT 2 inhibitor and GLP-1 receptor agonist which should also help with weight loss -At last visit, she lost 4 pounds -She lost 8 pounds since last visit.  Component     Latest Ref Rng & Units 10/06/2021  Cholesterol     0 - 200 mg/dL 133  Triglycerides     0.0 - 149.0 mg/dL 114.0  HDL Cholesterol     >39.00 mg/dL 34.40 (L)  VLDL     0.0 - 40.0 mg/dL 22.8  LDL (calc)     0 - 99 mg/dL 76  Total CHOL/HDL Ratio      4  NonHDL      98.65  Lipid panel much improved.  For now I would suggest to continue the same dose of Crestor.  Philemon Kingdom, MD PhD [facility]

## 2021-10-06 NOTE — Patient Instructions (Addendum)
Please continue: - Metformin ER 1000 mg 2x a day - Farxiga 10 mg daily before b'fast - Mounjaro 10 mg weekly  Please return in 4 months.

## 2021-11-10 DIAGNOSIS — E119 Type 2 diabetes mellitus without complications: Secondary | ICD-10-CM | POA: Diagnosis not present

## 2021-11-10 DIAGNOSIS — H5202 Hypermetropia, left eye: Secondary | ICD-10-CM | POA: Diagnosis not present

## 2021-11-10 DIAGNOSIS — H524 Presbyopia: Secondary | ICD-10-CM | POA: Diagnosis not present

## 2021-11-10 DIAGNOSIS — H53002 Unspecified amblyopia, left eye: Secondary | ICD-10-CM | POA: Diagnosis not present

## 2021-11-10 LAB — HM DIABETES EYE EXAM

## 2022-01-10 ENCOUNTER — Other Ambulatory Visit: Payer: Self-pay | Admitting: Internal Medicine

## 2022-01-10 DIAGNOSIS — E1140 Type 2 diabetes mellitus with diabetic neuropathy, unspecified: Secondary | ICD-10-CM

## 2022-01-18 ENCOUNTER — Ambulatory Visit (INDEPENDENT_AMBULATORY_CARE_PROVIDER_SITE_OTHER): Payer: BC Managed Care – PPO | Admitting: Family Medicine

## 2022-01-18 ENCOUNTER — Encounter: Payer: Self-pay | Admitting: Family Medicine

## 2022-01-18 DIAGNOSIS — M549 Dorsalgia, unspecified: Secondary | ICD-10-CM

## 2022-01-18 MED ORDER — IBUPROFEN 200 MG PO TABS: 200.0000 mg | ORAL_TABLET | Freq: Three times a day (TID) | ORAL | Status: AC | PRN

## 2022-01-18 MED ORDER — TIZANIDINE HCL 4 MG PO TABS: 2.0000 mg | ORAL_TABLET | Freq: Four times a day (QID) | ORAL | 0 refills | Status: DC | PRN

## 2022-01-18 NOTE — Progress Notes (Signed)
We talked about potentially holding statin if needed, d/w pt.   ? ?He has been more attuned to his sugar readings with current meter.  A1c improved on most recent check.  Per endocrinology.  I will defer. ? ?Back pain.  More pain yesterday.  Waxing and waning.  He thought he pulled a muscle.  Episodic pain.  No rash.  B paraspinal muscle pain, lower T spine vs upper L spine.  Can radiate around the trunk to either side.  No FCNAVD.  He has moving awkwardly with moving a gas tank when he noted it initially.  No leg sx.  More sx with leaning forward.   ? ?He thought the sx were more muscular and didn't feel like prev nerve pain.   ? ?Meds, vitals, and allergies reviewed.  ? ?ROS: Per HPI unless specifically indicated in ROS section  ? ?GEN: nad, alert and oriented ?HEENT: ncat ?NECK: supple w/o LA ?CV: rrr.   ?PULM: ctab, no inc wob ?ABD: soft, +bs ?EXT: no edema ?SKIN: no acute rash, well perfused ?Back midline nontender to palpation at time of exam. ?

## 2022-01-18 NOTE — Patient Instructions (Signed)
Try heat, stretching, ibuprofen with food, and then tizanidine if needed.   ?Take care.  Glad to see you. ?

## 2022-01-21 NOTE — Assessment & Plan Note (Signed)
Likely muscle strain causing intermittent symptoms.  Discussed options.  Okay for outpatient follow-up. ?Try heat, stretching, ibuprofen with food, and then tizanidine if needed.  Routine medication cautions discussed with patient.  He agrees to plan. ?

## 2022-01-22 ENCOUNTER — Other Ambulatory Visit: Payer: Self-pay | Admitting: Family Medicine

## 2022-02-07 DIAGNOSIS — D3709 Neoplasm of uncertain behavior of other specified sites of the oral cavity: Secondary | ICD-10-CM | POA: Diagnosis not present

## 2022-02-09 ENCOUNTER — Encounter: Payer: Self-pay | Admitting: Internal Medicine

## 2022-02-09 ENCOUNTER — Ambulatory Visit (INDEPENDENT_AMBULATORY_CARE_PROVIDER_SITE_OTHER): Payer: BC Managed Care – PPO | Admitting: Internal Medicine

## 2022-02-09 VITALS — BP 140/98 | HR 85 | Ht 72.0 in | Wt 275.6 lb

## 2022-02-09 DIAGNOSIS — E669 Obesity, unspecified: Secondary | ICD-10-CM

## 2022-02-09 DIAGNOSIS — E785 Hyperlipidemia, unspecified: Secondary | ICD-10-CM | POA: Diagnosis not present

## 2022-02-09 DIAGNOSIS — E1140 Type 2 diabetes mellitus with diabetic neuropathy, unspecified: Secondary | ICD-10-CM

## 2022-02-09 DIAGNOSIS — Z794 Long term (current) use of insulin: Secondary | ICD-10-CM | POA: Diagnosis not present

## 2022-02-09 LAB — POCT GLYCOSYLATED HEMOGLOBIN (HGB A1C): Hemoglobin A1C: 6.6 % — AB (ref 4.0–5.6)

## 2022-02-09 MED ORDER — METFORMIN HCL ER 500 MG PO TB24: 1000.0000 mg | ORAL_TABLET | Freq: Two times a day (BID) | ORAL | 3 refills | Status: DC

## 2022-02-09 MED ORDER — DAPAGLIFLOZIN PROPANEDIOL 10 MG PO TABS: 10.0000 mg | ORAL_TABLET | Freq: Every day | ORAL | 3 refills | Status: DC

## 2022-02-09 MED ORDER — TIRZEPATIDE 10 MG/0.5ML ~~LOC~~ SOAJ: 10.0000 mg | SUBCUTANEOUS | 3 refills | Status: DC

## 2022-02-09 NOTE — Progress Notes (Signed)
Patient ID: Jerry Eaton, male   DOB: 02-02-1971, 51 y.o.   MRN: [id number]  ? ?This visit occurred during the SARS-CoV-2 public health emergency.  Safety protocols were in place, including screening questions prior to the visit, additional usage of staff PPE, and extensive cleaning of exam room while observing appropriate contact time as indicated for disinfecting solutions.  ? ?HPI: ?[name] is a 51 y.o.-year-old male, returning for follow-up for DM2, dx in ~2013, insulin-dependent since 2016-2017,  now off insulin, uncontrolled, with complications (DR, PN). Last visit 4 months ago. ? ?Interim history: ?No increased urination, blurry vision, nausea, chest pain.  ?He had low back pain, had back sx. no pain anymore but has some pain in his legs and he is wondering whether this may be related to his previous back pain. ? ?Reviewed HbA1c levels: ?Lab Results  ?Component Value Date  ? HGBA1C 6.6 (A) 10/06/2021  ? HGBA1C 7.5 (A) 06/06/2021  ? HGBA1C 6.9 (A) 01/20/2021  ? HGBA1C 6.9 (A) 08/29/2020  ? HGBA1C 6.7 (A) 04/28/2020  ? HGBA1C 6.2 (A) 08/21/2019  ? HGBA1C 6.5 (A) 03/26/2019  ? HGBA1C 7.2 (A) 11/24/2018  ? HGBA1C 7.2 (A) 07/14/2018  ? HGBA1C 7.7 (H) 03/06/2018  ? HGBA1C 7.8 11/25/2017  ? HGBA1C 7.0 08/20/2017  ? HGBA1C 8.8 05/10/2017  ? HGBA1C 9.2 (H) 02/27/2017  ? HGBA1C 9.0 (H) 07/06/2016  ? HGBA1C 9.2 (H) 04/02/2016  ? HGBA1C 8.3 (H) 09/19/2015  ? HGBA1C 9.3 (H) 02/04/2015  ? HGBA1C 9.7 (H) 11/01/2014  ? HGBA1C 9.3 (H) 06/18/2014  ? ?Pt was on a regimen of: ?- Metformin 500 mg 1x a day, with meals (Rx'ed BID, forgets second dose) ?- Lantus 65 units at bedtime  ?Tried JanuMet >> transaminitis. ? ?Now on: ?- Metformin ER 2000 mg with b'fast >> w/ dinner >> 1000 mg 2x a day ?- >> Farxiga 10 mg in a.m. ?- Ozempic 1 mg weekly >> Trulicity 4.5 mg weekly >> Mounjaro 10 mg weekly - had GERD  >> resolved ?He was previously on Tresiba 64 >> 40 units daily >> stopped ~02/2019 ? ?He checks sugars >4x a day -  with the Libre 3 CGM: ? ? ?Previously: ? ? ?He has hypoglycemia awareness in the 70s. ?Lowest: 98 >> 110 >> 90s, 110 >> 50s (one night) ?Highest: 200 >> 240 >> 170 >> 200 ? ?Glucometer: [employer] IQ;  One Touch Ultra 2 ? ?Pt's meals are: ?- Breakfast: bisquits ?- Lunch: sandwich, burgers (may skip) ?- Dinner: burgers, chicken, steak ?- Snacks: 0-1 ?He is still traveling a lot for work. ? ?-No CKD, last BUN/creatinine:  ?Lab Results  ?Component Value Date  ? BUN 21 06/06/2021  ? BUN 15 07/20/2020  ? CREATININE 0.81 06/06/2021  ? CREATININE 0.79 07/20/2020  ? ?ACR normal: ?Lab Results  ?Component Value Date  ? MICRALBCREAT 4.1 06/06/2021  ? MICRALBCREAT 0.6 03/26/2019  ? MICRALBCREAT 4.6 07/14/2018  ? MICRALBCREAT 4.1 02/27/2017  ? MICRALBCREAT 0.6 09/19/2015  ? MICRALBCREAT 0.7 07/26/2014  ? On lisinopril 2.5. ? ?-+ HL.  Last set of lipids: ?Lab Results  ?Component Value Date  ? CHOL 133 10/06/2021  ? HDL 34.40 (L) 10/06/2021  ? [place] 76 10/06/2021  ? LDLDIRECT 163.5 05/16/2012  ? TRIG 114.0 10/06/2021  ? CHOLHDL 4 10/06/2021  ?Crestor, Lipitor, fluvastatin XL caused joint and muscle aches.  Co-Q10 was not helping.  However, he is now on Crestor 5 mg daily and Zetia.  He only has occasional  muscle cramps. ? ?-  last eye exam was: 10/2021: No DR, prev.+DR ? ?- + some numbness and tingling in feet. Also pain. ? ?His wife and daughter moved here from [place] in 09/2019. ? ?ROS: ?+ see HPI ?Neurological: no tremors/+ numbness/+ tingling/no dizziness ? ?I reviewed pt's medications, allergies, PMH, social hx, family hx, and changes were documented in the history of present illness. Otherwise, unchanged from my initial visit note. ? ?Past Medical History:  ?Diagnosis Date  ? Clotting disorder ([place])   ? DVT  ? Diabetes mellitus   ? DVT (deep venous thrombosis) ([place])   ? Family history of polyps in the colon   ? Fatty liver   ? GERD (gastroesophageal reflux disease)   ? History of chicken pox   ? Hyperlipidemia   ?  no meds now 05-28-16  ? Obesity   ? Sleep apnea   ? not using CPAP at this time 05-28-16  ? ?Past Surgical History:  ?Procedure Laterality Date  ? COLONOSCOPY    ? 2012, 2017  ? LUMBAR DISC SURGERY    ? L4/L5  ? PILONIDAL CYST EXCISION    ? 2001  ? ?Social History  ? ?Social History  ? Marital status: Single  ?  Spouse name: N/A  ? Number of children: 0  ? ?Social History Main Topics  ? Smoking status: Current Every Day Smoker  ?  Packs/day: 1.00  ?  Years: 20.00  ?  Types: Cigarettes  ? Smokeless tobacco: Never Used  ? Alcohol use Yes  ?   Comment: 10-15 per week, usually on the weekends  ? Drug use: No  ? ?Social History Narrative  ? [employer] grad  ? 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 Engaged 2017  ? ?Current Outpatient Medications on File Prior to Visit  ?Medication Sig Dispense Refill  ? Continuous Blood Gluc Sensor (FREESTYLE LIBRE 3 SENSOR) MISC 1 each by Does not apply route every 14 (fourteen) days. 6 each 3  ? dapagliflozin propanediol (FARXIGA) 10 MG TABS tablet Take 1 tablet (10 mg total) by mouth daily before breakfast. 90 tablet 3  ? ezetimibe (ZETIA) 10 MG tablet Take 1 tablet (10 mg total) by mouth daily. 90 tablet 3  ? ibuprofen (ADVIL) 200 MG tablet Take 1-3 tablets (200-600 mg total) by mouth every 8 (eight) hours as needed (with food).    ? lisinopril (ZESTRIL) 2.5 MG tablet TAKE ONE TABLET BY MOUTH ONE TIME DAILY 90 tablet 0  ? loratadine (CLARITIN) 10 MG tablet Take 1 tablet (10 mg total) by mouth daily. 30 tablet 12  ? metFORMIN (GLUCOPHAGE-XR) 500 MG 24 hr tablet Take 2 tablets (1,000 mg total) by mouth in the morning and at bedtime.    ? rosuvastatin (CRESTOR) 5 MG tablet Take 1 tablet (5 mg total) by mouth daily. 90 tablet 3  ? tirzepatide (MOUNJARO) 10 MG/0.5ML Pen Inject 10 mg into the skin once a week. 6 mL 3  ? tiZANidine (ZANAFLEX) 4 MG tablet Take 0.5-1 tablets (2-4 mg total) by mouth every 6 (six) hours as needed for muscle spasms (sedation caution). 30 tablet 0  ? ?No  current facility-administered medications on file prior to visit.  ? ?Allergies  ?Allergen Reactions  ? Other Anaphylaxis  ?  Bee stings  ? Chantix [Varenicline Tartrate]   ?  irritable  ? Crestor [Rosuvastatin Calcium]   ?  myalgia  ? Januvia [Sitagliptin Phosphate]   ?  Inc in LFTs. Occurred  with janumet, but prev tolerated metformin alone  ? Lipitor [Atorvastatin Calcium]   ?  myalgia  ? ?Family History  ?Problem Relation Age of Onset  ? Diabetes Mother   ? Stroke Mother   ? Diabetes Father   ? Prostate cancer Father   ?     dx'd in his early 81s  ? Hyperlipidemia Brother   ? Colon cancer Paternal Uncle   ?     dx'd near age 68  ? Diabetes Maternal Uncle   ? Esophageal cancer Neg Hx   ? Stomach cancer Neg Hx   ? Rectal cancer Neg Hx   ?Pt has FH of DM in Mother, father, M uncle.  ? ?PE: ?BP (!) 140/98 (BP Location: Right Arm, Patient Position: Sitting, Cuff Size: Normal)   Pulse 85   Ht 6' (1.829 m)   Wt 275 lb 9.6 oz (125 kg)   SpO2 98%   BMI 37.38 kg/m?  ?Wt Readings from Last 3 Encounters:  ?02/09/22 275 lb 9.6 oz (125 kg)  ?01/18/22 280 lb (127 kg)  ?10/06/21 286 lb 3.2 oz (129.8 kg)  ? ?Constitutional: overweight, in NAD ?Eyes: PERRLA, EOMI, no exophthalmos ?ENT: moist mucous membranes, no thyromegaly, no cervical lymphadenopathy ?Cardiovascular: RRR, No MRG ?Respiratory: CTA B ?Musculoskeletal: no deformities, strength intact in all 4 ?Skin: moist, warm, no rashes ?Neurological: no tremor with outstretched hands, DTR normal in all 4 ?Diabetic Foot Exam - Simple   ?Simple Foot Form ?Diabetic Foot exam was performed with the following findings: Yes 02/09/2022  8:17 AM  ?Visual Inspection ?No deformities, no ulcerations, no other skin breakdown bilaterally: Yes ?Sensation Testing ?See comments: Yes ?Pulse Check ?Posterior Tibialis and Dorsalis pulse intact bilaterally: Yes ?Comments ?Slight decrease sensation on the plantar aspect of the toes bilaterally ?  ? ? ?ASSESSMENT: ?1. DM2, now  noninsulin-dependent, uncontrolled, with complications ?- PN ?- DR ? ?-He had a freestyle libre CGM in the past but this kept coming off ?-No personal history of pancreatitis or family history of medullary thyroid cancer ? ?2.

## 2022-02-09 NOTE — Patient Instructions (Addendum)
Please continue: ?- Metformin ER 1000 mg 2x a day ?- Farxiga 10 mg daily before b'fast ?- Mounjaro 10 mg weekly ? ?Try Alpha lipoic acid 600 mg 2x a day for neuropathy. ?Add a B complex once a day. ? ?Please return in 6 months. ?

## 2022-03-27 DIAGNOSIS — D225 Melanocytic nevi of trunk: Secondary | ICD-10-CM | POA: Diagnosis not present

## 2022-03-27 DIAGNOSIS — D485 Neoplasm of uncertain behavior of skin: Secondary | ICD-10-CM | POA: Diagnosis not present

## 2022-03-27 DIAGNOSIS — B078 Other viral warts: Secondary | ICD-10-CM | POA: Diagnosis not present

## 2022-03-27 DIAGNOSIS — L821 Other seborrheic keratosis: Secondary | ICD-10-CM | POA: Diagnosis not present

## 2022-03-27 DIAGNOSIS — Z1283 Encounter for screening for malignant neoplasm of skin: Secondary | ICD-10-CM | POA: Diagnosis not present

## 2022-04-07 ENCOUNTER — Other Ambulatory Visit: Payer: Self-pay | Admitting: Internal Medicine

## 2022-04-07 DIAGNOSIS — E1140 Type 2 diabetes mellitus with diabetic neuropathy, unspecified: Secondary | ICD-10-CM

## 2022-06-02 ENCOUNTER — Other Ambulatory Visit: Payer: Self-pay | Admitting: Internal Medicine

## 2022-06-02 DIAGNOSIS — E785 Hyperlipidemia, unspecified: Secondary | ICD-10-CM

## 2022-06-11 ENCOUNTER — Other Ambulatory Visit: Payer: Self-pay | Admitting: Internal Medicine

## 2022-06-15 ENCOUNTER — Encounter: Payer: Self-pay | Admitting: Family Medicine

## 2022-06-15 ENCOUNTER — Ambulatory Visit (INDEPENDENT_AMBULATORY_CARE_PROVIDER_SITE_OTHER): Payer: BC Managed Care – PPO | Admitting: Family Medicine

## 2022-06-15 VITALS — BP 120/76 | HR 85 | Temp 97.6°F | Ht 72.0 in | Wt 271.0 lb

## 2022-06-15 DIAGNOSIS — E1140 Type 2 diabetes mellitus with diabetic neuropathy, unspecified: Secondary | ICD-10-CM

## 2022-06-15 DIAGNOSIS — M722 Plantar fascial fibromatosis: Secondary | ICD-10-CM

## 2022-06-15 DIAGNOSIS — Z794 Long term (current) use of insulin: Secondary | ICD-10-CM

## 2022-06-15 DIAGNOSIS — Z0000 Encounter for general adult medical examination without abnormal findings: Secondary | ICD-10-CM

## 2022-06-15 DIAGNOSIS — E785 Hyperlipidemia, unspecified: Secondary | ICD-10-CM

## 2022-06-15 DIAGNOSIS — Z23 Encounter for immunization: Secondary | ICD-10-CM

## 2022-06-15 DIAGNOSIS — Z0001 Encounter for general adult medical examination with abnormal findings: Secondary | ICD-10-CM

## 2022-06-15 DIAGNOSIS — L989 Disorder of the skin and subcutaneous tissue, unspecified: Secondary | ICD-10-CM

## 2022-06-15 DIAGNOSIS — Z7189 Other specified counseling: Secondary | ICD-10-CM

## 2022-06-15 MED ORDER — METFORMIN HCL ER 500 MG PO TB24
1000.0000 mg | ORAL_TABLET | Freq: Every day | ORAL | Status: DC
Start: 2022-06-15 — End: 2023-01-14

## 2022-06-15 NOTE — Patient Instructions (Addendum)
Go to the lab on the way out.   If you have mychart we'll likely use that to update you.     Stretch your arch prior to getting out of bed or getting up.   Roll a frozen water bottle under your left arch.   Refer to podiatry.    If you have true low sugars then please update Dr. Cruzita Lederer.  Check both meters in the meantime.    I would stop crestor for 2 weeks and see how you feel.  If better, then let me know.  If not then restart.    Take care.  Glad to see you.

## 2022-06-15 NOTE — Progress Notes (Unsigned)
CPE- See plan.  Routine anticipatory guidance given to patient.  See health maintenance.  The possibility exists that previously documented standard health maintenance information may have been brought forward from a previous encounter into this note.  If needed, that same information has been updated to reflect the current situation based on today's encounter.    Tetanus 2023 Flu 2023 PNA prev done.  Shingles not due covid 2021 Colonoscopy 2021 PSA not due.  Living will d/w pt. Wife designated if patient were incapacitated.   Smoking cessation encouraged.  HCV screening pending 2021   He had prev HAV and HBV vaccines per patient report.    R lateral foot with a lesion noted.  Longstanding.  Episodically tender.  D/w pt about podiatry referral.  See exam.  L foot pain near plantar fascia origin.  D/w pt about stretching and icing.    He is driving about ~19,622 miles a year for work.  D/w pt.    DM2 per endo.  He is having some lower sugars in the middle of the night.  D/w pt about not skipping meals in the middle of the day.  Down to 69s or 44s in the middle of the night but he didn't have sx. Unclear if this is an accurate reading- he can calibrate against his old meter.    We talked diet and exercise.  Lipids pending.  He has some aches on crestor and we talked about holding for 2 weeks.  He isn't fasting.    PMH and SH reviewed  Meds, vitals, and allergies reviewed.   ROS: Per HPI.  Unless specifically indicated otherwise in HPI, the patient denies:  General: fever. Eyes: acute vision changes ENT: sore throat Cardiovascular: chest pain Respiratory: SOB GI: vomiting GU: dysuria Musculoskeletal: acute back pain Derm: acute rash Neuro: acute motor dysfunction Psych: worsening mood Endocrine: polydipsia Heme: bleeding Allergy: hayfever  GEN: nad, alert and oriented HEENT: ncat NECK: supple w/o LA CV: rrr. PULM: ctab, no inc wob ABD: soft, +bs EXT: no edema SKIN: no  acute rash 2 calloses on the R foot laterally.

## 2022-06-16 LAB — CBC WITH DIFFERENTIAL/PLATELET
Absolute Monocytes: 501 cells/uL (ref 200–950)
Basophils Absolute: 59 cells/uL (ref 0–200)
Basophils Relative: 0.9 %
Eosinophils Absolute: 130 cells/uL (ref 15–500)
Eosinophils Relative: 2 %
HCT: 44 % (ref 38.5–50.0)
Hemoglobin: 15.2 g/dL (ref 13.2–17.1)
Lymphs Abs: 1300 cells/uL (ref 850–3900)
MCH: 28.4 pg (ref 27.0–33.0)
MCHC: 34.5 g/dL (ref 32.0–36.0)
MCV: 82.1 fL (ref 80.0–100.0)
MPV: 10.6 fL (ref 7.5–12.5)
Monocytes Relative: 7.7 %
Neutro Abs: 4511 cells/uL (ref 1500–7800)
Neutrophils Relative %: 69.4 %
Platelets: 273 10*3/uL (ref 140–400)
RBC: 5.36 10*6/uL (ref 4.20–5.80)
RDW: 13.3 % (ref 11.0–15.0)
Total Lymphocyte: 20 %
WBC: 6.5 10*3/uL (ref 3.8–10.8)

## 2022-06-16 LAB — COMPREHENSIVE METABOLIC PANEL
AG Ratio: 1.6 (calc) (ref 1.0–2.5)
ALT: 23 U/L (ref 9–46)
AST: 17 U/L (ref 10–40)
Albumin: 4.2 g/dL (ref 3.6–5.1)
Alkaline phosphatase (APISO): 60 U/L (ref 36–130)
BUN: 16 mg/dL (ref 7–25)
CO2: 24 mmol/L (ref 20–32)
Calcium: 9.5 mg/dL (ref 8.6–10.3)
Chloride: 102 mmol/L (ref 98–110)
Creat: 0.84 mg/dL (ref 0.60–1.29)
Globulin: 2.6 g/dL (calc) (ref 1.9–3.7)
Glucose, Bld: 142 mg/dL — ABNORMAL HIGH (ref 65–99)
Potassium: 4.4 mmol/L (ref 3.5–5.3)
Sodium: 137 mmol/L (ref 135–146)
Total Bilirubin: 0.5 mg/dL (ref 0.2–1.2)
Total Protein: 6.8 g/dL (ref 6.1–8.1)

## 2022-06-16 LAB — LIPID PANEL
Cholesterol: 158 mg/dL (ref <200)
HDL: 35 mg/dL — ABNORMAL LOW (ref >=40)
LDL Cholesterol (Calc): 90 mg/dL (calc)
Non-HDL Cholesterol (Calc): 123 mg/dL (calc) (ref <130)
Total CHOL/HDL Ratio: 4.5 (calc) (ref <5.0)
Triglycerides: 237 mg/dL — ABNORMAL HIGH (ref <150)

## 2022-06-17 NOTE — Assessment & Plan Note (Signed)
  We talked diet and exercise.  Lipids pending.  He has some aches on crestor and we talked about holding for 2 weeks.  He isn't fasting.    He can update me when he sees how he feels off Crestor.

## 2022-06-17 NOTE — Assessment & Plan Note (Signed)
DM2 per endo.  He is having some lower sugars in the middle of the night.  D/w pt about not skipping meals in the middle of the day.  Down to 66s or 23s in the middle of the night but he didn't have sx. Unclear if this is an accurate reading- he can calibrate against his old meter.  I will update endocrinology in the meantime.  He will let us know if he is having true low sugars.

## 2022-06-17 NOTE — Assessment & Plan Note (Signed)
Likely Planter fasciitis and he also has 2 calluses on the right lateral foot.  I think it makes sense to see podiatry to see if he can get his plantar fasciitis addressed and to see if that would help his gait and therefore help him with callus formation.  Discussed icing and stretching his foot for plantar fasciitis in the meantime.

## 2022-06-17 NOTE — Assessment & Plan Note (Signed)
Tetanus 2023 Flu 2023 PNA prev done.  Shingles not due covid 2021 Colonoscopy 2021 PSA not due.  Living will d/w pt. Wife designated if patient were incapacitated.   Smoking cessation encouraged.  HCV screening pending 2021   He had prev HAV and HBV vaccines per patient report.

## 2022-06-17 NOTE — Assessment & Plan Note (Signed)
Living will d/w pt.  Wife designated if patient were incapacitated.   ?

## 2022-07-03 ENCOUNTER — Ambulatory Visit (INDEPENDENT_AMBULATORY_CARE_PROVIDER_SITE_OTHER): Payer: BC Managed Care – PPO

## 2022-07-03 ENCOUNTER — Ambulatory Visit (INDEPENDENT_AMBULATORY_CARE_PROVIDER_SITE_OTHER): Payer: BC Managed Care – PPO | Admitting: Podiatry

## 2022-07-03 ENCOUNTER — Encounter: Payer: Self-pay | Admitting: Podiatry

## 2022-07-03 DIAGNOSIS — D2371 Other benign neoplasm of skin of right lower limb, including hip: Secondary | ICD-10-CM

## 2022-07-03 DIAGNOSIS — M722 Plantar fascial fibromatosis: Secondary | ICD-10-CM

## 2022-07-03 MED ORDER — TRIAMCINOLONE ACETONIDE 40 MG/ML IJ SUSP
20.0000 mg | Freq: Once | INTRAMUSCULAR | Status: AC
  Administered 2022-07-03: 20 mg

## 2022-07-03 MED ORDER — MELOXICAM 15 MG PO TABS: 15.0000 mg | ORAL_TABLET | Freq: Every day | ORAL | 3 refills | Status: DC

## 2022-07-03 MED ORDER — METHYLPREDNISOLONE 4 MG PO TBPK: ORAL_TABLET | ORAL | 0 refills | Status: DC

## 2022-07-03 NOTE — Progress Notes (Signed)
Subjective:  Patient ID: Jerry Eaton, male    DOB: 09/24/1971,  MRN: [id number] HPI Chief Complaint  Patient presents with   Foot Pain    Plantar heel left - aching x few weeks, AM pain, PCP thought PF, tried stretching and ice   Callouses    Lateral foot right - callused lesion x several months, very tender if rubbed on by shoes   New Patient (Initial Visit)    51 y.o. male presents with the above complaint.   ROS: Denies fever chills nausea vomiting muscle aches pains calf pain back pain chest pain shortness of breath.  Past Medical History:  Diagnosis Date   Clotting disorder ([place])    DVT   Diabetes mellitus    DVT (deep venous thrombosis) ([place])    Family history of polyps in the colon    Fatty liver    GERD (gastroesophageal reflux disease)    History of chicken pox    Hyperlipidemia    no meds now 05-28-16   Obesity    Sleep apnea    not using CPAP at this time 05-28-16   Past Surgical History:  Procedure Laterality Date   COLONOSCOPY     2012, 2017   LUMBAR [place]     L4/L5   PILONIDAL CYST EXCISION     2001    Current Outpatient Medications:    meloxicam (MOBIC) 15 MG tablet, Take 1 tablet (15 mg total) by mouth daily., Disp: 30 tablet, Rfl: 3   methylPREDNISolone (MEDROL DOSEPAK) 4 MG TBPK tablet, 6 day dose pack - take as directed, Disp: 21 tablet, Rfl: 0   ALPHA-LIPOIC ACID PO, Take 2 tablets by mouth daily., Disp: , Rfl:    b complex vitamins capsule, Take 1 capsule by mouth daily., Disp: , Rfl:    Continuous Blood Gluc Sensor (FREESTYLE LIBRE 3 SENSOR) MISC, 1 each by Does not apply route every 14 (fourteen) days., Disp: 6 each, Rfl: 3   dapagliflozin propanediol (FARXIGA) 10 MG TABS tablet, Take 1 tablet (10 mg total) by mouth daily before breakfast., Disp: 90 tablet, Rfl: 3   ezetimibe (ZETIA) 10 MG tablet, TAKE ONE TABLET BY MOUTH ONE TIME DAILY, Disp: 90 tablet, Rfl: 0   ibuprofen (ADVIL) 200 MG tablet, Take 1-3 tablets (200-600 mg total) by  mouth every 8 (eight) hours as needed (with food)., Disp: , Rfl:    lisinopril (ZESTRIL) 2.5 MG tablet, TAKE ONE TABLET BY MOUTH ONE TIME DAILY, Disp: 90 tablet, Rfl: 1   loratadine (CLARITIN) 10 MG tablet, Take 1 tablet (10 mg total) by mouth daily., Disp: 30 tablet, Rfl: 12   metFORMIN (GLUCOPHAGE-XR) 500 MG 24 hr tablet, Take 2 tablets (1,000 mg total) by mouth daily with breakfast., Disp: , Rfl:   Allergies  Allergen Reactions   Other Anaphylaxis    Bee stings   Chantix [Varenicline Tartrate]     irritable   Crestor [Rosuvastatin Calcium]     Possible myalgias.     Januvia [Sitagliptin [place] in LFTs. Occurred with janumet, but prev tolerated metformin alone   Lipitor [Atorvastatin Calcium]     myalgia   Review of Systems Objective:  There were no vitals filed for this visit.  General: Well developed, nourished, in no acute distress, alert and oriented x3   Dermatological: Skin is warm, dry and supple bilateral. Nails x 10 are well maintained; remaining integument appears unremarkable at this time. There are no open sores, no  preulcerative lesions, no rash or signs of infection present.  Benign skin lesion plantar lateral aspect of the fifth met base of the right foot.  He has a small benign skin lesion tyloma subfifth metatarsal head right foot.  Vascular: Dorsalis Pedis artery and Posterior Tibial artery pedal pulses are 2/4 bilateral with immedate capillary fill time. Pedal hair growth present. No varicosities and no lower extremity edema present bilateral.   Neruologic: Grossly intact via light touch bilateral. Vibratory intact via tuning fork bilateral. Protective threshold with Semmes Wienstein monofilament intact to all pedal sites bilateral. Patellar and Achilles deep tendon reflexes 2+ bilateral. No Babinski or clonus noted bilateral.   Musculoskeletal: No gross boney pedal deformities bilateral. No pain, crepitus, or limitation noted with foot and ankle range  of motion bilateral. Muscular strength 5/5 in all groups tested bilateral.  Pain on palpation left heel.  No pain on medial-lateral compression of the calcaneus.  Majority the pain is located over the medial calcaneal and plantar central calcaneal tubercles.  Metatarsus adductus bilateral  Gait: Unassisted, Nonantalgic.    Radiographs:  Radiographs left foot today demonstrate an osseously mature foot he does have metatarsus adductus.  He does have some midfoot injuries with early osteoarthritic changes.  He also has's very small plantar distally oriented calcaneal heel spur with soft tissue increase in density at the plantar fascial calcaneal insertion site.  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  Metatarsus adductus right foot.  Benign skin lesion fifth met base right foot.  Plan: Discussed etiology pathology conservative surgical therapies debrided all benign skin lesions.  Started him on methylprednisolone he will watch his sugars carefully.  Also he will follow-up with meloxicam afterwards.  I injected the left heel today 20 mg Kenalog 5 mg Marcaine point maximal tenderness.  Placed him in a plantar fascial brace left foot.  Discussed etiology pathology conservative versus surgical therapies discussed appropriate shoe gear stretching exercise ice therapy and shoe gear modifications.  Follow-up with him in 1 month     Divante Kotch T. [place], [place]

## 2022-07-18 ENCOUNTER — Telehealth: Payer: Self-pay

## 2022-07-18 NOTE — Telephone Encounter (Signed)
Pt contacted and confirmed he had d/c Mounjaro but recently had a steroid injection and his sugars got high so he resumed his 10 mg weekly injection. Pt states he has a 3 month supply that he did not want to waste.

## 2022-08-02 ENCOUNTER — Ambulatory Visit (INDEPENDENT_AMBULATORY_CARE_PROVIDER_SITE_OTHER): Payer: BC Managed Care – PPO | Admitting: Podiatry

## 2022-08-02 ENCOUNTER — Encounter: Payer: Self-pay | Admitting: Podiatry

## 2022-08-02 DIAGNOSIS — M722 Plantar fascial fibromatosis: Secondary | ICD-10-CM

## 2022-08-02 MED ORDER — TRIAMCINOLONE ACETONIDE 40 MG/ML IJ SUSP
20.0000 mg | Freq: Once | INTRAMUSCULAR | Status: AC
  Administered 2022-08-02: 20 mg

## 2022-08-02 NOTE — Progress Notes (Signed)
He presents today states that his Planter fasciitis is approximately 90% improved as he refers to his left foot.  Continues to wear the plantar fascial brace and take his meloxicam.  Objective: Vital signs are stable alert oriented x3 there is no erythema edema cellulitis drainage or odor.  He has mild tenderness on palpation medial calcaneal tubercle but much improved.  Assessment: 90% improved Planter fasciitis.  Plan: Discussed etiology pathology conservative surgical therapies I want to go ahead and give him another injection 20 mg Kenalog 5 mg Marcaine point maximal tenderness.  Continue all conservative therapies to 100% relief.

## 2022-08-17 ENCOUNTER — Ambulatory Visit (INDEPENDENT_AMBULATORY_CARE_PROVIDER_SITE_OTHER): Payer: BC Managed Care – PPO | Admitting: Internal Medicine

## 2022-08-17 ENCOUNTER — Encounter: Payer: Self-pay | Admitting: Internal Medicine

## 2022-08-17 VITALS — BP 128/80 | HR 91 | Ht 72.0 in | Wt 267.8 lb

## 2022-08-17 DIAGNOSIS — E785 Hyperlipidemia, unspecified: Secondary | ICD-10-CM

## 2022-08-17 DIAGNOSIS — E669 Obesity, unspecified: Secondary | ICD-10-CM | POA: Diagnosis not present

## 2022-08-17 DIAGNOSIS — Z794 Long term (current) use of insulin: Secondary | ICD-10-CM | POA: Diagnosis not present

## 2022-08-17 DIAGNOSIS — E1140 Type 2 diabetes mellitus with diabetic neuropathy, unspecified: Secondary | ICD-10-CM | POA: Diagnosis not present

## 2022-08-17 LAB — POCT GLYCOSYLATED HEMOGLOBIN (HGB A1C): Hemoglobin A1C: 6.4 % — AB (ref 4.0–5.6)

## 2022-08-17 MED ORDER — FREESTYLE LIBRE 3 SENSOR MISC: 1.0000 | 3 refills | Status: DC

## 2022-08-17 NOTE — Patient Instructions (Signed)
Please continue: - Metformin ER 1000 mg 2x a day - Farxiga 10 mg daily before b'fast - Mounjaro 10 mg weekly  Please return in 6 months.

## 2022-08-17 NOTE — Progress Notes (Signed)
Patient ID: Jerry Eaton, male   DOB: 1971/09/15, 51 y.o.   MRN: [id number]   HPI: Jerry Eaton is a 51 y.o.-year-old male, returning for follow-up for DM2, dx in ~2013, insulin-dependent since 2016-2017,  now off insulin, uncontrolled, with complications (DR, PN). Last visit 6 months ago.  Interim history: No increased urination, blurry vision, nausea, chest pain.  He had low back pain, had back sx. >> no pain anymore but continues to have some pain/cramping in his legs - plantar fasciitis - podiatrist gave him a steroid inj.Jerry Eaton He just started MVIs recently and stopped ALA, B complex.  Reviewed HbA1c levels: Lab Results  Component Value Date   HGBA1C 6.6 (A) 02/09/2022   HGBA1C 6.6 (A) 10/06/2021   HGBA1C 7.5 (A) 06/06/2021   HGBA1C 6.9 (A) 01/20/2021   HGBA1C 6.9 (A) 08/29/2020   HGBA1C 6.7 (A) 04/28/2020   HGBA1C 6.2 (A) 08/21/2019   HGBA1C 6.5 (A) 03/26/2019   HGBA1C 7.2 (A) 11/24/2018   HGBA1C 7.2 (A) 07/14/2018   HGBA1C 7.7 (H) 03/06/2018   HGBA1C 7.8 11/25/2017   HGBA1C 7.0 08/20/2017   HGBA1C 8.8 05/10/2017   HGBA1C 9.2 (H) 02/27/2017   HGBA1C 9.0 (H) 07/06/2016   HGBA1C 9.2 (H) 04/02/2016   HGBA1C 8.3 (H) 09/19/2015   HGBA1C 9.3 (H) 02/04/2015   HGBA1C 9.7 (H) 11/01/2014   He is on: - Metformin ER 2000 mg with b'fast >> w/ dinner >> 1000 mg 2x a day - Farxiga 10 mg in a.m. -  Mounjaro 10 mg weekly - had GERD  >> resolved He was previously on Tresiba 64 >> 40 units daily >> stopped ~02/2019 He was previously on Invokana. He was previously on Janumet but developed transaminitis. Previously on Lantus 65 units at bedtime.  He checks sugars >4x a day - with the [place] 3 CGM:  Previously:   He has hypoglycemia awareness in the 70s. Lowest: 90s, 110 >> 50s (one night) Highest: 240 >> 170 >> 200 >> 268.  Glucometer: [employer] IQ;  One Touch Ultra 2  Pt's meals are: - Breakfast: bisquits - Lunch: sandwich, burgers (may skip) - Dinner: burgers, chicken,  steak - Snacks: 0-1 He is still traveling a lot for work.  -No CKD, last BUN/creatinine:  Lab Results  Component Value Date   BUN 16 06/15/2022   BUN 21 06/06/2021   CREATININE 0.84 06/15/2022   CREATININE 0.81 06/06/2021   ACR normal: Lab Results  Component Value Date   MICRALBCREAT 4.1 06/06/2021   MICRALBCREAT 0.6 03/26/2019   MICRALBCREAT 4.6 07/14/2018   MICRALBCREAT 4.1 02/27/2017   MICRALBCREAT 0.6 09/19/2015   MICRALBCREAT 0.7 07/26/2014   On lisinopril 2.5.  -+ HL.  Last set of lipids: Lab Results  Component Value Date   CHOL 158 06/15/2022   HDL 35 (L) 06/15/2022   LDLCALC 90 06/15/2022   LDLDIRECT 163.5 05/16/2012   TRIG 237 (H) 06/15/2022   CHOLHDL 4.5 06/15/2022  Crestor, Lipitor, fluvastatin XL caused joint and muscle aches.  Co-Q10 was not helping.  However, he then started on Crestor 5 mg daily and Zetia.  He has muscle cramps >> stopped Crestor.  -  last eye exam was: 10/2021: No DR, prev.+DR  - + some numbness and tingling in feet.  Last foot exam: 07/03/2022.  His wife and daughter moved here from [place] in 09/2019.  ROS: + see HPI Neurological: no tremors/+ numbness/+ tingling/no dizziness  I reviewed pt's medications, allergies, PMH, social hx, family hx,  and changes were documented in the history of present illness. Otherwise, unchanged from my initial visit note.  Past Medical History:  Diagnosis Date   Clotting disorder ([place])    DVT   Diabetes mellitus    DVT (deep venous thrombosis) ([place])    Family history of polyps in the colon    Fatty liver    GERD (gastroesophageal reflux disease)    History of chicken pox    Hyperlipidemia    no meds now 05-28-16   Obesity    Sleep apnea    not using CPAP at this time 05-28-16   Past Surgical History:  Procedure Laterality Date   COLONOSCOPY     2012, 2017   LUMBAR [place] SURGERY     L4/L5   PILONIDAL CYST EXCISION     2001   Social History   Social History   Marital status: Single     Spouse name: Jerry Eaton   Number of children: 0   Social History Main Topics   Smoking status: Current Every Day Smoker    Packs/day: 1.00    Years: 20.00    Types: Cigarettes   Smokeless tobacco: Never Used   Alcohol use Yes     Comment: 10-15 per week, usually on the weekends   Drug use: No   Social History Narrative   Social research officer, 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     b complex vitamins capsule Take 1 capsule by mouth daily.     Continuous Blood Gluc Sensor (FREESTYLE LIBRE 3 SENSOR) MISC 1 each by Does not apply route every 14 (fourteen) days. 6 each 3   dapagliflozin propanediol (FARXIGA) 10 MG TABS tablet Take 1 tablet (10 mg total) by mouth daily before breakfast. 90 tablet 3   ezetimibe (ZETIA) 10 MG tablet TAKE ONE TABLET BY MOUTH ONE TIME DAILY 90 tablet 0   ibuprofen (ADVIL) 200 MG tablet Take 1-3 tablets (200-600 mg total) by mouth every 8 (eight) hours as needed (with food).     lisinopril (ZESTRIL) 2.5 MG tablet TAKE ONE TABLET BY MOUTH ONE TIME DAILY 90 tablet 1   loratadine (CLARITIN) 10 MG tablet Take 1 tablet (10 mg total) by mouth daily. 30 tablet 12   meloxicam (MOBIC) 15 MG tablet Take 1 tablet (15 mg total) by mouth daily. 30 tablet 3   metFORMIN (GLUCOPHAGE-XR) 500 MG 24 hr tablet Take 2 tablets (1,000 mg total) by mouth daily with breakfast.     No current facility-administered medications on file prior to visit.   Allergies  Allergen Reactions   Other Anaphylaxis    Bee stings   Chantix [Varenicline Tartrate]     irritable   Crestor [Rosuvastatin Calcium]     Possible myalgias.     Januvia [Sitagliptin [place] in LFTs. Occurred with janumet, but prev tolerated metformin alone   Lipitor [Atorvastatin Calcium]     myalgia   Family History  Problem Relation Age of  Onset   Diabetes Mother    Stroke Mother    Diabetes Father    Prostate cancer Father        dx'd in his early 19s   Hyperlipidemia Brother    Colon cancer Paternal Uncle        dx'd near  age 38   Diabetes Maternal Uncle    Esophageal cancer Neg Hx    Stomach cancer Neg Hx    Rectal cancer Neg Hx   Pt has FH of DM in Mother, father, M uncle.   PE: BP 128/80 (BP Location: Left Arm, Patient Position: Sitting, Cuff Size: Normal)   Pulse 91   Ht 6' (1.829 m)   Wt 267 lb 12.8 oz (121.5 kg)   SpO2 98%   BMI 36.32 kg/m  Wt Readings from Last 3 Encounters:  08/17/22 267 lb 12.8 oz (121.5 kg)  06/15/22 271 lb (122.9 kg)  02/09/22 275 lb 9.6 oz (125 kg)   Constitutional: overweight, in NAD Eyes:  EOMI, no exophthalmos ENT: no neck masses, no cervical lymphadenopathy Cardiovascular: RRR, No MRG Respiratory: CTA B Musculoskeletal: no deformities Skin:no rashes Neurological: no tremor with outstretched hands  ASSESSMENT: 1. DM2, now noninsulin-dependent, uncontrolled, with complications - PN - DR  -He had a freestyle libre CGM in the past but this kept coming off -No personal history of pancreatitis or family history of medullary thyroid cancer  2. HL  3. Obesity class II  PLAN:  1. Patient with longstanding, uncontrolled, type 2 diabetes, on metformin, SGLT2 inhibitor and GLP-1/GIP receptor agonist with improved control after the start of the coronavirus pandemic, after he cut out eating out and alcohol.  However, towards the end of the pandemic he relaxed his diet and HbA1c increased.  We switched [facility] to [facility] and sugars improved again.  At last visit HbA1c was 6.6%, stable, at goal.  Sugars were excellent so we did not change his regimen. CGM interpretation: -At today's visit, we reviewed his CGM downloads: It appears that 90% of values are in target range (goal >70%), while 10% are higher than 180 (goal <25%), and 0% are lower than 70 (goal <4%).  The  calculated average blood sugar is 136.  The projected HbA1c for the next 3 months (GMI) is 6.6%. -Reviewing the CGM trends, sugars are well controlled throughout the day.  He has an occasional hyperglycemic spike after breakfast or dinner, we discussed about looking at possible culprits and staying well-hydrated.  Otherwise, I did recommend to continue [facility] for now, especially with the holidays coming up (he came off since last visit, but restarted after a steroid injection when sugars started to increase). - I suggested to:  Patient Instructions  Please continue: - Metformin ER 1000 mg 2x a day - Farxiga 10 mg daily before b'fast - Mounjaro 10 mg weekly  Please return in 6 months.  - we checked his HbA1c: 7%  - advised to check sugars at different times of the day - 4x a day, rotating check times - advised for yearly eye exams >> he is UTD - at last visit he had some decrease sensation in his feet.  I recommended alpha-lipoic acid and B complex.  He started on this, but he is currently off.  We discussed about possibly restarting.  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  He was given a steroid injection and was planning a brace.  He plans to go back to orthopedics to see if his back pain could the culprit for his leg pain and muscle cramping.  We discussed that, especially in the setting of neuropathy, he may need neurology evaluation - return to clinic in 6 months   2. HL -Reviewed latest lipid panel from 05/2022: LDL above our goal of less than 70, HDL low,  triglycerides high: Lab Results  Component Value Date   CHOL 158 06/15/2022   HDL 35 (L) 06/15/2022   LDLCALC 90 06/15/2022   LDLDIRECT 163.5 05/16/2012   TRIG 237 (H) 06/15/2022   CHOLHDL 4.5 06/15/2022  -He had joint pains from statins, including from fluvastatin XL.  He then started on Crestor 5 mg daily, which he tolerated fairly well, but now off due to muscle cramps.  Muscle cramps continue even though he  stopped Crestor.  He is on Zetia 10 mg daily.  3.  Obesity class II -continue SGLT 2 inhibitor and GLP-1/GIP receptor agonist which should also help with weight loss -She lost 23 pounds before the last 3 visits combined -She lost 8 more pounds since last visit  Philemon Kingdom, MD PhD [facility] Endocrinology

## 2022-09-20 ENCOUNTER — Encounter: Payer: Self-pay | Admitting: Podiatry

## 2022-09-20 ENCOUNTER — Ambulatory Visit (INDEPENDENT_AMBULATORY_CARE_PROVIDER_SITE_OTHER): Payer: BC Managed Care – PPO | Admitting: Podiatry

## 2022-09-20 DIAGNOSIS — R252 Cramp and spasm: Secondary | ICD-10-CM

## 2022-09-20 DIAGNOSIS — M722 Plantar fascial fibromatosis: Secondary | ICD-10-CM

## 2022-09-20 MED ORDER — CYCLOBENZAPRINE HCL 10 MG PO TABS: 10.0000 mg | ORAL_TABLET | Freq: Three times a day (TID) | ORAL | 0 refills | Status: DC | PRN

## 2022-09-20 NOTE — Progress Notes (Signed)
He presents today for follow-up of his bilateral Planter fasciitis states is still about 90% better but the worst thing he has noticed lately was the cramping in his legs he states its sometimes even in his hands.  He states that he wears comfortable shoes and would like to consider orthotics.  Objective: Vital signs stable alert and oriented x 3.  Pulses are palpable.  Neurologic sensorium is slightly diminished per Semmes Weinstein monofilament Deetjen reflexes are intact muscle strength is normal symmetrical minimal pain on palpation medial calcaneal tubercles bilateral.  No calf pain on palpation no firmness no warmth on palpation.  Negative Homans' sign.  Assessment: Nocturnal cramping calf pain midfoot pain residual Planter fasciitis.  Plan: Discussed etiology pathology and surgical therapies at this point recommended cyclobenzaprine as needed also discussed on topical Relief creams also will get him into a pair of orthotics.  And I recommended that he follow-up with his primary care provider for electrolyte checkup and also recommended electrolyte supplements such as Powerade and Gatorade without sugar.  Was casted today for orthotics.

## 2022-09-24 ENCOUNTER — Encounter: Payer: Self-pay | Admitting: Family Medicine

## 2022-09-24 ENCOUNTER — Ambulatory Visit (INDEPENDENT_AMBULATORY_CARE_PROVIDER_SITE_OTHER): Payer: BC Managed Care – PPO | Admitting: Family Medicine

## 2022-09-24 VITALS — BP 120/82 | HR 88 | Temp 97.7°F | Ht 72.0 in | Wt 277.0 lb

## 2022-09-24 DIAGNOSIS — E785 Hyperlipidemia, unspecified: Secondary | ICD-10-CM | POA: Diagnosis not present

## 2022-09-24 DIAGNOSIS — R252 Cramp and spasm: Secondary | ICD-10-CM | POA: Diagnosis not present

## 2022-09-24 DIAGNOSIS — D3709 Neoplasm of uncertain behavior of other specified sites of the oral cavity: Secondary | ICD-10-CM | POA: Diagnosis not present

## 2022-09-24 MED ORDER — TIRZEPATIDE 10 MG/0.5ML ~~LOC~~ SOAJ: 10.0000 mg | SUBCUTANEOUS | Status: DC

## 2022-09-24 MED ORDER — ROSUVASTATIN CALCIUM 5 MG PO TABS: 5.0000 mg | ORAL_TABLET | Freq: Every day | ORAL | Status: DC

## 2022-09-24 NOTE — Progress Notes (Unsigned)
Cramping/aching d/w pt.  Still going on in spite of being off statin.  He stopped all meds for a few days but it didn't resolve totally.  No sx today, at time of exam. More sx after eating salty fries.  Cramping in the hands, but more commonly in the legs.  H/o aches in R hamstring at baseline, longstanding. More recently with calf and foot cramping.  Sx are not at the joint.  Pickle juice helped.  Sx are at rest, at night.  Not exertional.  More sx with stretching.  Drinking propel water in the meantime has been helpful.    Meds, vitals, and allergies reviewed.   ROS: Per HPI unless specifically indicated in ROS section   GEN: nad, alert and oriented HEENT: mucous membranes moist NECK: supple w/o LA CV: rrr.   PULM: ctab, no inc wob ABD: soft, +bs EXT: no edema SKIN: Well-perfused

## 2022-09-24 NOTE — Patient Instructions (Signed)
Hold farxiga for 10 days.  Add on 32 ounces of water.   Let me know how that does.   Update me either way.   Take care.  Glad to see you.

## 2022-09-26 DIAGNOSIS — R252 Cramp and spasm: Secondary | ICD-10-CM | POA: Insufficient documentation

## 2022-09-26 NOTE — Assessment & Plan Note (Signed)
Unclear if this is due to relative dehydration exacerbated by [place].   Hold farxiga for 10 days.  Add on 32 ounces of water per day. Let me know how that does.   Update me either way.   He agrees with plan.

## 2022-09-28 ENCOUNTER — Other Ambulatory Visit: Payer: Self-pay | Admitting: Internal Medicine

## 2022-10-03 ENCOUNTER — Telehealth: Payer: Self-pay

## 2022-10-03 ENCOUNTER — Other Ambulatory Visit (HOSPITAL_COMMUNITY): Payer: Self-pay

## 2022-10-03 NOTE — Telephone Encounter (Signed)
ERROR

## 2022-10-04 ENCOUNTER — Other Ambulatory Visit (HOSPITAL_COMMUNITY): Payer: Self-pay

## 2022-10-05 DIAGNOSIS — L989 Disorder of the skin and subcutaneous tissue, unspecified: Secondary | ICD-10-CM | POA: Diagnosis not present

## 2022-10-05 DIAGNOSIS — Z1283 Encounter for screening for malignant neoplasm of skin: Secondary | ICD-10-CM | POA: Diagnosis not present

## 2022-10-05 DIAGNOSIS — D225 Melanocytic nevi of trunk: Secondary | ICD-10-CM | POA: Diagnosis not present

## 2022-10-05 DIAGNOSIS — D485 Neoplasm of uncertain behavior of skin: Secondary | ICD-10-CM | POA: Diagnosis not present

## 2022-10-11 ENCOUNTER — Other Ambulatory Visit: Payer: Self-pay | Admitting: Internal Medicine

## 2022-10-11 DIAGNOSIS — E1140 Type 2 diabetes mellitus with diabetic neuropathy, unspecified: Secondary | ICD-10-CM

## 2022-10-30 ENCOUNTER — Encounter: Payer: Self-pay | Admitting: Family Medicine

## 2022-10-30 ENCOUNTER — Telehealth (INDEPENDENT_AMBULATORY_CARE_PROVIDER_SITE_OTHER): Payer: BC Managed Care – PPO | Admitting: Family Medicine

## 2022-10-30 VITALS — Temp 97.7°F | Ht 72.0 in | Wt 278.0 lb

## 2022-10-30 DIAGNOSIS — U071 COVID-19: Secondary | ICD-10-CM | POA: Insufficient documentation

## 2022-10-30 NOTE — Progress Notes (Signed)
VIRTUAL VISIT A virtual visit is felt to be most appropriate for this patient at this time.   I connected with the patient on 10/30/22 at  3:20 PM EST by virtual telehealth platform and verified that I am speaking with the correct person using two identifiers.   I discussed the limitations, risks, security and privacy concerns of performing an evaluation and management service by  virtual telehealth platform and the availability of in person appointments. I also discussed with the patient that there may be a patient responsible charge related to this service. The patient expressed understanding and agreed to proceed.  Patient location: Home Provider Location: [facility] Participants: Eliezer Lofts and Eloise Levels   Chief Complaint  Patient presents with   Covid Positive    Positive home test x 3 first one being on Sunday Symptoms started on 11/21/21 with Nasal Congestion   Nasal Congestion    History of Present Illness: 52 year old male patient of Dr. Josefine Class with history of sleep apnea, diabetes, obesity and smoking.   Just returned from a trip to [facility].  Date of onset: 10 days ago Initial symptoms included head congestion, fatigue, facial pressure and headache. Symptoms progressed to Sinus pressure. He feels that he is improving overall.  His symptoms are getting better and better each day.  Minimal nasal congestion at this point. No shortness of breath, no wheeze, no body aches.  No fever. He is asking for clearance to return to work.   Sick contacts: family with COVID over holidays COVID testing: Yes positive  3 days ago       No history of chronic lung disease such as asthma or COPD. Non-smoker.  COVID 19 screen No recent travel or known exposure to COVID19 The patient denies respiratory symptoms of COVID 19 at this time.  The importance of social distancing was discussed today.   Review of Systems  Constitutional:  Negative for chills and fever.   HENT:  Positive for congestion. Negative for ear pain.   Eyes:  Negative for pain and redness.  Respiratory:  Negative for cough and shortness of breath.   Cardiovascular:  Negative for chest pain, palpitations and leg swelling.  Gastrointestinal:  Negative for abdominal pain, blood in stool, constipation, diarrhea, nausea and vomiting.  Genitourinary:  Negative for dysuria.  Musculoskeletal:  Negative for falls and myalgias.  Skin:  Negative for rash.  Neurological:  Negative for dizziness.  Psychiatric/Behavioral:  Negative for depression. The patient is not nervous/anxious.       Past Medical History:  Diagnosis Date   Clotting disorder ([place])    DVT   Diabetes mellitus    DVT (deep venous thrombosis) ([place])    Family history of polyps in the colon    Fatty liver    GERD (gastroesophageal reflux disease)    History of chicken pox    Hyperlipidemia    no meds now 05-28-16   Obesity    Sleep apnea    not using CPAP at this time 05-28-16    reports that he has been smoking cigarettes. He has a 20.00 pack-year smoking history. He has never used smokeless tobacco. He reports current alcohol use. He reports that he does not use drugs.   Current Outpatient Medications:    ALPHA-LIPOIC ACID PO, Take 2 tablets by mouth daily., Disp: , Rfl:    b complex vitamins capsule, Take 1 capsule by mouth daily., Disp: , Rfl:    Continuous Blood Gluc Sensor (FREESTYLE LIBRE  3 SENSOR) MISC, 1 each by Does not apply route every 14 (fourteen) days., Disp: 6 each, Rfl: 3   cyclobenzaprine (FLEXERIL) 10 MG tablet, Take 1 tablet (10 mg total) by mouth 3 (three) times daily as needed for muscle spasms., Disp: 30 tablet, Rfl: 0   dapagliflozin propanediol (FARXIGA) 10 MG TABS tablet, Take 1 tablet (10 mg total) by mouth daily before breakfast., Disp: 90 tablet, Rfl: 3   ezetimibe (ZETIA) 10 MG tablet, TAKE ONE TABLET BY MOUTH ONE TIME DAILY, Disp: 90 tablet, Rfl: 0   ibuprofen (ADVIL) 200 MG tablet, Take 1-3  tablets (200-600 mg total) by mouth every 8 (eight) hours as needed (with food)., Disp: , Rfl:    lisinopril (ZESTRIL) 2.5 MG tablet, TAKE ONE TABLET BY MOUTH ONE TIME DAILY, Disp: 90 tablet, Rfl: 0   loratadine (CLARITIN) 10 MG tablet, Take 1 tablet (10 mg total) by mouth daily., Disp: 30 tablet, Rfl: 12   metFORMIN (GLUCOPHAGE-XR) 500 MG 24 hr tablet, Take 2 tablets (1,000 mg total) by mouth daily with breakfast., Disp: , Rfl:    Multiple Vitamin (MULTIVITAMIN) tablet, Take 1 tablet by mouth daily., Disp: , Rfl:    rosuvastatin (CRESTOR) 5 MG tablet, Take 1 tablet (5 mg total) by mouth daily., Disp: , Rfl:    tirzepatide (MOUNJARO) 10 MG/0.5ML Pen, Inject 10 mg into the skin once a week., Disp: , Rfl:    Observations/Objective: Temperature 97.7 F (36.5 C), temperature source Temporal, height 6' (1.829 m), weight 278 lb (126.1 kg).  Physical Exam  Physical Exam Constitutional:      General: The patient is not in acute distress. Pulmonary:     Effort: Pulmonary effort is normal. No respiratory distress.  Neurological:     Mental Status: The patient is alert and oriented to person, place, and time.  Psychiatric:        Mood and Affect: Mood normal.        Behavior: Behavior normal.    Assessment and Plan COVID-19 Assessment & Plan: Acute, patient with resolving COVID symptoms.  On day 10 of illness.  No risk for contagiousness.  Symptoms improving and not requiring any further treatment.  Supportive care recommended. Cleared to return to work without mask tommorow.       I discussed the assessment and treatment plan with the patient. The patient was provided an opportunity to ask questions and all were answered. The patient agreed with the plan and demonstrated an understanding of the instructions.   The patient was advised to call back or seek an in-person evaluation if the symptoms worsen or if the condition fails to improve as anticipated.     Eliezer Lofts, MD

## 2022-10-30 NOTE — Assessment & Plan Note (Signed)
Acute, patient with resolving COVID symptoms.  On day 10 of illness.  No risk for contagiousness.  Symptoms improving and not requiring any further treatment.  Supportive care recommended. Cleared to return to work without mask tommorow.

## 2022-11-02 ENCOUNTER — Other Ambulatory Visit: Payer: BC Managed Care – PPO

## 2022-11-12 ENCOUNTER — Ambulatory Visit (INDEPENDENT_AMBULATORY_CARE_PROVIDER_SITE_OTHER): Payer: BC Managed Care – PPO

## 2022-11-12 DIAGNOSIS — M722 Plantar fascial fibromatosis: Secondary | ICD-10-CM

## 2022-11-12 NOTE — Progress Notes (Signed)
Patient presents today to pick up custom molded foot orthotics recommended by Dr. Milinda Pointer.   Orthotics were dispensed and fit was satisfactory. Reviewed instructions for break-in and wear. Written instructions given to patient.  Patient will follow up as needed.   [facility] - order # [id number]

## 2022-11-20 DIAGNOSIS — D485 Neoplasm of uncertain behavior of skin: Secondary | ICD-10-CM | POA: Diagnosis not present

## 2022-11-20 DIAGNOSIS — L988 Other specified disorders of the skin and subcutaneous tissue: Secondary | ICD-10-CM | POA: Diagnosis not present

## 2022-11-23 ENCOUNTER — Ambulatory Visit (INDEPENDENT_AMBULATORY_CARE_PROVIDER_SITE_OTHER): Payer: BC Managed Care – PPO | Admitting: Family Medicine

## 2022-11-23 ENCOUNTER — Encounter: Payer: Self-pay | Admitting: Family Medicine

## 2022-11-23 VITALS — BP 116/80 | HR 80 | Temp 97.3°F | Ht 72.0 in | Wt 284.0 lb

## 2022-11-23 DIAGNOSIS — Z794 Long term (current) use of insulin: Secondary | ICD-10-CM

## 2022-11-23 DIAGNOSIS — R0789 Other chest pain: Secondary | ICD-10-CM

## 2022-11-23 DIAGNOSIS — H524 Presbyopia: Secondary | ICD-10-CM | POA: Diagnosis not present

## 2022-11-23 DIAGNOSIS — H52203 Unspecified astigmatism, bilateral: Secondary | ICD-10-CM | POA: Diagnosis not present

## 2022-11-23 DIAGNOSIS — E119 Type 2 diabetes mellitus without complications: Secondary | ICD-10-CM | POA: Diagnosis not present

## 2022-11-23 DIAGNOSIS — E1140 Type 2 diabetes mellitus with diabetic neuropathy, unspecified: Secondary | ICD-10-CM | POA: Diagnosis not present

## 2022-11-23 DIAGNOSIS — H5202 Hypermetropia, left eye: Secondary | ICD-10-CM | POA: Diagnosis not present

## 2022-11-23 DIAGNOSIS — H53002 Unspecified amblyopia, left eye: Secondary | ICD-10-CM | POA: Diagnosis not present

## 2022-11-23 LAB — COMPREHENSIVE METABOLIC PANEL
ALT: 21 U/L (ref 0–53)
AST: 16 U/L (ref 0–37)
Albumin: 4.4 g/dL (ref 3.5–5.2)
Alkaline Phosphatase: 59 U/L (ref 39–117)
BUN: 15 mg/dL (ref 6–23)
CO2: 26 mEq/L (ref 19–32)
Calcium: 9.2 mg/dL (ref 8.4–10.5)
Chloride: 104 mEq/L (ref 96–112)
Creatinine, Ser: 0.71 mg/dL (ref 0.40–1.50)
GFR: 108.03 mL/min (ref >60.00)
Glucose, Bld: 135 mg/dL — ABNORMAL HIGH (ref 70–99)
Potassium: 4.5 mEq/L (ref 3.5–5.1)
Sodium: 138 mEq/L (ref 135–145)
Total Bilirubin: 0.7 mg/dL (ref 0.2–1.2)
Total Protein: 7.1 g/dL (ref 6.0–8.3)

## 2022-11-23 LAB — CBC WITH DIFFERENTIAL/PLATELET
Basophils Absolute: 0.1 10*3/uL (ref 0.0–0.1)
Basophils Relative: 1.2 % (ref 0.0–3.0)
Eosinophils Absolute: 0.2 10*3/uL (ref 0.0–0.7)
Eosinophils Relative: 3.1 % (ref 0.0–5.0)
HCT: 44.6 % (ref 39.0–52.0)
Hemoglobin: 15.1 g/dL (ref 13.0–17.0)
Lymphocytes Relative: 27.3 % (ref 12.0–46.0)
Lymphs Abs: 1.6 10*3/uL (ref 0.7–4.0)
MCHC: 33.8 g/dL (ref 30.0–36.0)
MCV: 85.9 fl (ref 78.0–100.0)
Monocytes Absolute: 0.5 10*3/uL (ref 0.1–1.0)
Monocytes Relative: 8.5 % (ref 3.0–12.0)
Neutro Abs: 3.5 10*3/uL (ref 1.4–7.7)
Neutrophils Relative %: 59.9 % (ref 43.0–77.0)
Platelets: 278 10*3/uL (ref 150.0–400.0)
RBC: 5.19 Mil/uL (ref 4.22–5.81)
RDW: 13.6 % (ref 11.5–15.5)
WBC: 5.8 10*3/uL (ref 4.0–10.5)

## 2022-11-23 LAB — TSH: TSH: 0.88 u[IU]/mL (ref 0.35–5.50)

## 2022-11-23 LAB — HM DIABETES EYE EXAM

## 2022-11-23 LAB — HEMOGLOBIN A1C: Hgb A1c MFr Bld: 6.5 % (ref 4.6–6.5)

## 2022-11-23 MED ORDER — NITROGLYCERIN 0.4 MG SL SUBL: 0.4000 mg | SUBLINGUAL_TABLET | SUBLINGUAL | 3 refills | Status: DC | PRN

## 2022-11-23 NOTE — Patient Instructions (Addendum)
Go to the lab on the way out.   If you have mychart we'll likely use that to update you.    Limit exertion in the meantime.  Stay off mounjaro for now.  If you have to use nitroglycerin without relief then go to the ER or dial 911.  Take care.  Glad to see you.

## 2022-11-23 NOTE — Progress Notes (Unsigned)
10/14/22.  Had discomfort in chest.  Didn't go to ER.  Can radiate to the upper back. Some B jaw pain that is intermittent.  This doesn't feel like prev GERD.  He was able to exert in the meantime- was hiking in [place] and felt lightheaded- had discomfort prior to [place]- was able to make the trek w/o escalation of chest discomfort.  Then had covid.    Burping helps with pressure sensation.  Lower sternal burning.  Last episode was last week.  Has been about to work at home w/o sx in the last few days.    No sx today.   Hasn't taking mounjaro in last 2 weeks.  Not on crestor currently.  Has been off for months.   Prev cramping d/w pt.  Improved off farxiga, was able to restart in the meantime.  He inc'd fluid intake.    H/o DM2.

## 2022-11-25 DIAGNOSIS — R0789 Other chest pain: Secondary | ICD-10-CM | POA: Insufficient documentation

## 2022-11-25 NOTE — Assessment & Plan Note (Signed)
History of atypical chest discomfort, does not increase with exertion.  I think this is most likely to be related to [facility] use/GERD, but given his history I think it makes sense to exclude cardiac pathology. EKG without acute changes.  Discussed with patient at office visit. See notes on labs. Chest pain-free now.  Routine cautions given to patient. Limit exertion in the meantime.  Stay off mounjaro for now.  If any need for nitroglycerin without relief then go to the ER or dial 911.  Nitroglycerin cautions discussed with patient. Refer to cardiology.  He agrees with plan.

## 2022-12-03 ENCOUNTER — Encounter: Payer: Self-pay | Admitting: Cardiovascular Disease

## 2022-12-03 ENCOUNTER — Ambulatory Visit: Payer: BC Managed Care – PPO | Attending: Cardiovascular Disease | Admitting: Cardiovascular Disease

## 2022-12-03 VITALS — BP 128/83 | HR 90 | Ht 72.0 in | Wt 287.0 lb

## 2022-12-03 DIAGNOSIS — R072 Precordial pain: Secondary | ICD-10-CM

## 2022-12-03 DIAGNOSIS — R400 Somnolence: Secondary | ICD-10-CM

## 2022-12-03 DIAGNOSIS — R0683 Snoring: Secondary | ICD-10-CM

## 2022-12-03 DIAGNOSIS — Z720 Tobacco use: Secondary | ICD-10-CM

## 2022-12-03 DIAGNOSIS — Z6838 Body mass index (BMI) 38.0-38.9, adult: Secondary | ICD-10-CM

## 2022-12-03 DIAGNOSIS — E66812 Obesity, class 2: Secondary | ICD-10-CM

## 2022-12-03 DIAGNOSIS — E785 Hyperlipidemia, unspecified: Secondary | ICD-10-CM

## 2022-12-03 DIAGNOSIS — E119 Type 2 diabetes mellitus without complications: Secondary | ICD-10-CM

## 2022-12-03 DIAGNOSIS — R9431 Abnormal electrocardiogram [ECG] [EKG]: Secondary | ICD-10-CM

## 2022-12-03 DIAGNOSIS — E782 Mixed hyperlipidemia: Secondary | ICD-10-CM

## 2022-12-03 DIAGNOSIS — Z01812 Encounter for preprocedural laboratory examination: Secondary | ICD-10-CM

## 2022-12-03 DIAGNOSIS — K219 Gastro-esophageal reflux disease without esophagitis: Secondary | ICD-10-CM

## 2022-12-03 MED ORDER — METOPROLOL TARTRATE 100 MG PO TABS: ORAL_TABLET | ORAL | 0 refills | Status: DC

## 2022-12-03 NOTE — Patient Instructions (Addendum)
Medication Instructions:  Your physician recommends that you continue on your current medications as directed. Please refer to the Current Medication list given to you today.  *If you need a refill on your cardiac medications before your next appointment, please call your pharmacy*   Lab Work: Your physician recommends that you return as soon as possible to have the following labs drawn: Lipids, Lp(a) and BMET  If you have labs (blood work) drawn today and your tests are completely normal, you will receive your results only by: MyChart Message (if you have MyChart) OR A paper copy in the mail If you have any lab test that is abnormal or we need to change your treatment, we will call you to review the results.   Testing/Procedures: Your physician has requested that you have an echocardiogram. Echocardiography is a painless test that uses sound waves to create images of your heart. It provides your doctor with information about the size and shape of your heart and how well your heart's chambers and valves are working. This procedure takes approximately one hour. There are no restrictions for this procedure. Please do NOT wear cologne, perfume, aftershave, or lotions (deodorant is allowed). Please arrive 15 minutes prior to your appointment time.  Your physician has recommended that you have a sleep study. This test records several body functions during sleep, including: brain activity, eye movement, oxygen and carbon dioxide blood levels, heart rate and rhythm, breathing rate and rhythm, the flow of air through your mouth and nose, snoring, body muscle movements, and chest and belly movement.  Cardiac CT Angiography (CTA), is a special type of CT scan that uses a computer to produce multi-dimensional views of major blood vessels throughout the body. In CT angiography, a contrast material is injected through an IV to help visualize the blood vessels   Follow-Up: At [facility], you and  your health needs are our priority.  As part of our continuing mission to provide you with exceptional heart care, we have created designated Provider Care Teams.  These Care Teams include your primary Cardiologist (physician) and Advanced Practice Providers (APPs -  Physician Assistants and Nurse Practitioners) who all work together to provide you with the care you need, when you need it.  We recommend signing up for the patient portal called "MyChart".  Sign up information is provided on this After Visit Summary.  MyChart is used to connect with patients for Virtual Visits (Telemedicine).  Patients are able to view lab/test results, encounter notes, upcoming appointments, etc.  Non-urgent messages can be sent to your provider as well.   To learn more about what you can do with MyChart, go to [email or web address].    Your next appointment:   4-6 week(s)  Provider:   Shelva Majestic, MD     Other Instructions   Your cardiac CT will be scheduled at one of the below locations:   [facility] [address] [place], [place] [address] [phone]  If scheduled at [facility], please arrive at the [facility] En[facility] (Entrance C2) of [facility] 30 minutes prior to test start time. You can use the FREE valet parking offered at entrance C (encouraged to control the heart rate for the test)  Proceed to the [facility] Radiology Department (first floor) to check-in and test prep.  All radiology patients and guests should use entrance C2 at [facility], accessed from [facility], even though the hospital's physical address listed is  [address].     Please follow these instructions carefully (unless otherwise directed):  Hold all erectile dysfunction medications at least 3 days (72 hrs) prior to test. (Ie viagra, cialis, sildenafil, tadalafil, etc) We will administer nitroglycerin during this exam.   On the Night Before  the Test: Be sure to Drink plenty of water. Do not consume any caffeinated/decaffeinated beverages or chocolate 12 hours prior to your test. Do not take any antihistamines 12 hours prior to your test.  On the Day of the Test: Drink plenty of water until 1 hour prior to the test. Do not eat any food 1 hour prior to test. You may take your regular medications prior to the test.  Take metoprolol (Lopressor) 133m  two hours prior to test.      After the Test: Drink plenty of water. After receiving IV contrast, you may experience a mild flushed feeling. This is normal. On occasion, you may experience a mild rash up to 24 hours after the test. This is not dangerous. If this occurs, you can take Benadryl 25 mg and increase your fluid intake. If you experience trouble breathing, this can be serious. If it is severe call 911 IMMEDIATELY. If it is mild, please call our office. If you take any of these medications: Glipizide/Metformin, Avandament, Glucavance, please do not take 48 hours after completing test unless otherwise instructed.  We will call to schedule your test 2-4 weeks out understanding that some insurance companies will need an authorization prior to the service being performed.   For non-scheduling related questions, please contact the cardiac imaging nurse navigator should you have any questions/concerns: SMarchia Bond Cardiac Imaging Nurse Navigator MGordy Clement Cardiac Imaging Nurse Navigator [facility] Direct Office Dial: [phone]  For scheduling needs, including cancellations and rescheduling, please call [place] [phone]

## 2022-12-03 NOTE — Progress Notes (Signed)
Cardiology Office Note    Date:  12/09/2022   ID:  Jerry Eaton, DOB September 12, 1971, MRN [id number]  PCP:  Tonia Ghent, MD  Cardiologist:  Shelva Majestic, MD   New cardiology consult referred by Elsie Stain, MD for evaluation of chest pain   History of Present Illness:  Jerry Eaton is a 52 y.o. male who is followed by Dr. Damita Dunnings for primary care.  He has a history of diabetes mellitus for at least 10 years and most recently is on [place] and metformin.  He also is on low-dose lisinopril not for hypertension but for potential renal preservation.  He had recently traveled to [place] and had to climb over 270 steps.  He would experience some shortness of breath.  Recently, he has experienced some vague chest pressure in his neck and jaw and experienced symptoms on October 14, 2022.  He did not go to the emergency room.  He felt this discomfort did not feel like his previous GERD however his symptoms did improve with burping.  He was evaluated by Dr. Damita Dunnings on November 23, 2022.  He has had several episodes of this lower sternal burning with his last episode approximately 1 week before his November 23, 2018 for evaluation.  He had stopped taking Mounjaro.  He has a history of hyperlipidemia and had been on rosuvastatin which he had stopped and is currently taking Zetia 10 mg daily. He tells me he was diagnosed with very mild sleep apnea approximately 15 years ago.  He does admit to snoring.  He believes his sleep is nonrestorative.  He cannot sleep on his back.  Epworth Sleepiness Scale score was calculated in the office today and this endorsed at an, consistent with excessive daytime sleepiness.  He now presents for cardiology evaluation.     Past Medical History:  Diagnosis Date   Clotting disorder ([place])    DVT   Diabetes mellitus    DVT (deep venous thrombosis) ([place])    Family history of polyps in the colon    Fatty liver    GERD (gastroesophageal reflux disease)    History of  chicken pox    Hyperlipidemia    no meds now 05-28-16   Obesity    Sleep apnea    not using CPAP at this time 05-28-16    Past Surgical History:  Procedure Laterality Date   COLONOSCOPY     2012, 2017   LUMBAR [place]     L4/L5   PILONIDAL CYST EXCISION     2001    Current Medications: Outpatient Medications Prior to Visit  Medication Sig Dispense Refill   ALPHA-LIPOIC ACID PO Take 2 tablets by mouth daily.     b complex vitamins capsule Take 1 capsule by mouth daily.     Continuous Blood Gluc Sensor (FREESTYLE LIBRE 3 SENSOR) MISC 1 each by Does not apply route every 14 (fourteen) days. 6 each 3   dapagliflozin propanediol (FARXIGA) 10 MG TABS tablet Take 1 tablet (10 mg total) by mouth daily before breakfast. 90 tablet 3   ezetimibe (ZETIA) 10 MG tablet TAKE ONE TABLET BY MOUTH ONE TIME DAILY 90 tablet 0   ibuprofen (ADVIL) 200 MG tablet Take 1-3 tablets (200-600 mg total) by mouth every 8 (eight) hours as needed (with food).     lisinopril (ZESTRIL) 2.5 MG tablet TAKE ONE TABLET BY MOUTH ONE TIME DAILY 90 tablet 0   loratadine (CLARITIN) 10 MG tablet  Take 1 tablet (10 mg total) by mouth daily. 30 tablet 12   metFORMIN (GLUCOPHAGE-XR) 500 MG 24 hr tablet Take 2 tablets (1,000 mg total) by mouth daily with breakfast.     Multiple Vitamin (MULTIVITAMIN) tablet Take 1 tablet by mouth daily.     rosuvastatin (CRESTOR) 5 MG tablet Take 1 tablet (5 mg total) by mouth daily.     cyclobenzaprine (FLEXERIL) 10 MG tablet Take 1 tablet (10 mg total) by mouth 3 (three) times daily as needed for muscle spasms. (Patient not taking: Reported on 12/03/2022) 30 tablet 0   nitroGLYCERIN (NITROSTAT) 0.4 MG SL tablet Place 1 tablet (0.4 mg total) under the tongue every 5 (five) minutes as needed for chest pain. Max 3 doses in 15 minutes. 25 tablet 3   tirzepatide (MOUNJARO) 10 MG/0.5ML Pen Inject 10 mg into the skin once a week. (Patient not taking: Reported on 12/03/2022)     No  facility-administered medications prior to visit.     Allergies:   Other, Chantix [varenicline tartrate], Januvia [sitagliptin phosphate], and Lipitor [atorvastatin calcium]   Social History   Socioeconomic History   Marital status: Married    Spouse name: Not on file   Number of children: 0   Years of education: Not on file   Highest education level: Not on file  Occupational History   Occupation: Neurosurgeon  Tobacco Use   Smoking status: Every Day    Packs/day: 1.00    Years: 20.00    Total pack years: 20.00    Types: Cigarettes   Smokeless tobacco: Never   Tobacco comments:    12/03/2022-Patient smokes a pack daily  Substance and Sexual Activity   Alcohol use: Yes    Comment: very rare   Drug use: No   Sexual activity: Yes  Other Topics Concern   Not on file  Social History Narrative   [employer] grad   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  He attended [employer].  He has a degree from [employer] and majored in zoology.  He is married for 4-1/2 years.  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  He has a 30-year history of tobacco use, currently 1 pack/day.  He does drink occasional beer and liquor.  He does not routinely exercise.  Family History:  The patient's family history includes Colon cancer in his paternal uncle; Diabetes in his father, maternal uncle, and mother; Hyperlipidemia in his brother; Prostate cancer in his father; Stroke in his mother.  His mother is 71 and has a history of stroke, atrial fibrillation and diabetes.  Father died at age 1 and had heart disease, cancer and  diabetes.  He has a brother age 69 and a sister age 20.  ROS General: Negative; No fevers, chills, or night sweats;  HEENT: Negative; No changes in vision or hearing, sinus congestion, difficulty swallowing Pulmonary: Negative; No cough, wheezing, shortness of breath, hemoptysis Cardiovascular: See HPI GI: Negative; No nausea, vomiting, diarrhea, or abdominal pain GU: Negative; No dysuria, hematuria, or difficulty voiding Musculoskeletal: Negative; no myalgias, joint pain,  or weakness Hematologic/Oncology: Negative; no easy bruising, bleeding Endocrine: Diabetes mellitus Neuro: Negative; no changes in balance, headaches Skin: Negative; No rashes or skin lesions Psychiatric: Negative; No behavioral problems, depression Sleep: Positive for snoring, daytime sleepiness, nonrestorative sleep, and difficulty sleeping on his back.negative; No bruxism, restless legs, hypnogognic hallucinations, no cataplexy   Epworth Sleepiness Scale: Situation   Chance of Dozing/Sleeping (0 = never , 1 = slight chance , 2 = moderate chance , 3 = high chance )   sitting and reading 2   watching TV 2   sitting inactive in a public place 1   being a passenger in a motor vehicle for an hour or more 3   lying down in the afternoon 1   sitting and talking to someone 0   sitting quietly after lunch (no alcohol) 1   while stopped for a few minutes in traffic as the driver 0   Total Score  10    Other comprehensive 14 point system review is negative.   PHYSICAL EXAM:   VS:  BP 128/83 (BP Location: Left Arm, Patient Position: Sitting, Cuff Size: Large)   Pulse 90   Ht 6' (1.829 m)   Wt 287 lb (130.2 kg)   SpO2 96%   BMI 38.92 kg/m     Repeat blood pressure by me was 116/78.  Wt Readings from Last 3 Encounters:  12/03/22 287 lb (130.2 kg)  11/23/22 284 lb (128.8 kg)  10/30/22 278 lb (126.1 kg)    General: Alert, oriented, no distress.  Obese Skin: normal turgor, no rashes, warm and dry HEENT:  Normocephalic, atraumatic. Pupils equal round and reactive to light; sclera anicteric; extraocular muscles intact;  Nose without nasal septal hypertrophy Mouth/Parynx: Large tongue; Mallinpatti scale 3 Neck: No JVD, no carotid bruits; normal carotid upstroke Lungs: clear to ausculatation and percussion; no wheezing or rales Chest wall: without tenderness to palpitation Heart: PMI not displaced, RRR, s1 s2 normal, 1/6 systolic murmur, no diastolic murmur, no rubs, gallops, thrills, or heaves Abdomen: Central adiposity; soft, nontender; no hepatosplenomehaly, BS+; abdominal aorta nontender and not dilated by palpation. Back: no CVA tenderness Pulses 2+ Musculoskeletal: full range of motion, normal strength, no joint deformities Extremities: no clubbing cyanosis or edema, Homan's sign negative  Neurologic: grossly nonfocal; Cranial nerves grossly wnl Psychologic: Normal mood and affect   Studies/Labs Reviewed:   December 03, 2022 ECG (independently read by me): Sinus rhythm at 90, PAC, Left axis deviation, Inferior Q wave III, aVF; QTc 455 msec  Recent Labs:    Latest Ref Rng & Units 12/04/2022    8:22 AM 11/23/2022    9:58 AM 06/15/2022    3:04 PM  BMP  Glucose 70 - 99 mg/dL 172  135  142   BUN 6 - 24 mg/dL 19  15  16   $ Creatinine 0.76 - 1.27 mg/dL 0.83  0.71  0.84   BUN/Creat Ratio 9 - 20 23   SEE NOTE:   Sodium 134 - 144 mmol/L 140  138  137   Potassium 3.5 - 5.2 mmol/L 4.6  4.5  4.4   Chloride 96 - 106 mmol/L 101  104  102   CO2 20 - 29 mmol/L 22  26  24   $ Calcium 8.7 - 10.2 mg/dL 9.3  9.2  9.5         Latest Ref Rng & Units 11/23/2022    9:58 AM 06/15/2022    3:04 PM 06/06/2021    8:32 AM  Hepatic Function  Total Protein 6.0 - 8.3 g/dL 7.1  6.8  8.0   Albumin 3.5 - 5.2 g/dL 4.4   4.6   AST 0 - 37 U/L 16  17  18   $ ALT 0 - 53 U/L 21  23  22   $ Alk Phosphatase 39 - 117 U/L 59   70   Total Bilirubin 0.2 - 1.2 mg/dL 0.7  0.5  0.4        Latest Ref Rng & Units 11/23/2022     9:58 AM 06/15/2022    3:04 PM 07/20/2020   10:01 AM  CBC  WBC 4.0 - 10.5 K/uL 5.8  6.5  6.7   Hemoglobin 13.0 - 17.0 g/dL 15.1  15.2  15.9   Hematocrit 39.0 - 52.0 % 44.6  44.0  49.0   Platelets 150.0 - 400.0 K/uL 278.0  273     Lab Results  Component Value Date   MCV 85.9 11/23/2022   MCV 82.1 06/15/2022   MCV 84 07/20/2020   Lab Results  Component Value Date   TSH 0.88 11/23/2022   Lab Results  Component Value Date   HGBA1C 6.5 11/23/2022     BNP No results found for: "BNP"  ProBNP No results found for: "PROBNP"   Lipid Panel     Component Value Date/Time   CHOL 215 (H) 12/04/2022 0822   TRIG 131 12/04/2022 0822   HDL 40 12/04/2022 0822   CHOLHDL 5.4 (H) 12/04/2022 0822   CHOLHDL 4.5 06/15/2022 1504   VLDL 22.8 10/06/2021 0820   LDLCALC 151 (H) 12/04/2022 0822   LDLCALC 90 06/15/2022 1504   LDLDIRECT 163.5 05/16/2012 0812   LABVLDL 24 12/04/2022 0822     RADIOLOGY: No results found.   Additional studies/ records that were reviewed today include:  I reviewed the records of Dr. Elsie Stain.   ASSESSMENT:    1. Precordial pain   2. Abnormal EKG   3. Snoring   4. Daytime somnolence   5. Mixed hyperlipidemia   6. Tobacco use   7. Type 2 diabetes mellitus without complication, without long-term current use of insulin (HCC)   8. Class 2 severe obesity due to excess calories with serious comorbidity and body mass index (BMI) of 38.0 to 38.9 in adult ([place])   9. Pre-procedure lab exam   10. Gastroesophageal reflux disease, unspecified whether esophagitis present     PLAN:  Jerry Eaton is a 52 year old gentleman who has a 10-year history of diabetes mellitus, 30-year history of tobacco use, history of GERD, and recently has experienced episodes of lower chest discomfort radiating to his back and also experiencing some lower jaw discomfort.  His symptoms do improve somewhat with burping.  He has been on Farxiga 10 mg and metformin 1000 mg  twice a day for hyperlipidemia.  Remotely, he had been on rosuvastatin 5 mg but stopped therapy.  Laboratory in June 15, 2022 showed total cholesterol 158, HDL 35, triglycerides were significantly elevated at 237 and LDL cholesterol was 90.  I suspect he most likely had increased LDL particle number with probable small LDL particles if NMR LipoProfile analysis was performed.  He had traveled recently to [place] and did hike and climb a significant number of steps to his temple.  He also tells me that approximately 15 years ago he was told of having mild sleep apnea.  He was never started on CPAP therapy.  Presently, he admits to snoring.  His sleep is  nonrestorative.  He cannot sleep on his back raising concern for potential progression of sleep apnea particularly with his moderate obesity and BMI of 38.9.  Presently, I am scheduling him for an echo Doppler study to evaluate his recent symptomatology and shortness of breath.  With his chest tightness and symptoms I am scheduling him for coronary CTA to assess both calcium score as well as potential luminal obstructive CAD.  I also have recommended he undergo a sleep evaluation and we will try to schedule him for an in lab split-night sleep sleep study.  I am recommending follow-up lipid panel and will also check an LP(a).  I suspect he will need to initiate lipid-lowering therapy for his mixed hyperlipidemia.  He had remotely been on rosuvastatin at very low-dose 5 mg but this has been discontinued and currently has been taking Zetia 10 mg daily.  I will contact him regarding the results of his studies and see him in approximately 4 to 6 weeks for follow-up evaluation   Medication Adjustments/Labs and Tests Ordered: Current medicines are reviewed at length with the patient today.  Concerns regarding medicines are outlined above.  Medication changes, Labs and Tests ordered today are listed in the Patient Instructions below. Patient Instructions  Medication  Instructions:  Your physician recommends that you continue on your current medications as directed. Please refer to the Current Medication list given to you today.  *If you need a refill on your cardiac medications before your next appointment, please call your pharmacy*   Lab Work: Your physician recommends that you return as soon as possible to have the following labs drawn: Lipids, Lp(a) and BMET  If you have labs (blood work) drawn today and your tests are completely normal, you will receive your results only by: MyChart Message (if you have MyChart) OR A paper copy in the mail If you have any lab test that is abnormal or we need to change your treatment, we will call you to review the results.   Testing/Procedures: Your physician has requested that you have an echocardiogram. Echocardiography is a painless test that uses sound waves to create images of your heart. It provides your doctor with information about the size and shape of your heart and how well your heart's chambers and valves are working. This procedure takes approximately one hour. There are no restrictions for this procedure. Please do NOT wear cologne, perfume, aftershave, or lotions (deodorant is allowed). Please arrive 15 minutes prior to your appointment time.  Your physician has recommended that you have a sleep study. This test records several body functions during sleep, including: brain activity, eye movement, oxygen and carbon dioxide blood levels, heart rate and rhythm, breathing rate and rhythm, the flow of air through your mouth and nose, snoring, body muscle movements, and chest and belly movement.  Cardiac CT Angiography (CTA), is a special type of CT scan that uses a computer to produce multi-dimensional views of major blood vessels throughout the body. In CT angiography, a contrast material is injected through an IV to help visualize the blood vessels   Follow-Up: At [facility], you and your  health needs are our priority.  As part of our continuing mission to provide you with exceptional heart care, we have created designated Provider Care Teams.  These Care Teams include your primary Cardiologist (physician) and Advanced Practice Providers (APPs -  Physician Assistants and Nurse Practitioners) who all work together to provide you with the care you need, when you need  it.  We recommend signing up for the patient portal called "MyChart".  Sign up information is provided on this After Visit Summary.  MyChart is used to connect with patients for Virtual Visits (Telemedicine).  Patients are able to view lab/test results, encounter notes, upcoming appointments, etc.  Non-urgent messages can be sent to your provider as well.   To learn more about what you can do with MyChart, go to [email or web address].    Your next appointment:   4-6 week(s)  Provider:   Shelva Majestic, MD     Other Instructions   Your cardiac CT will be scheduled at one of the below locations:   [facility] [address] [place], [place] [address] [phone]  If scheduled at [facility], please arrive at the [facility] and [facility]'s Entrance (Entrance C2) of [facility] 30 minutes prior to test start time. You can use the FREE valet parking offered at entrance C (encouraged to control the heart rate for the test)  Proceed to the [facility] Radiology Department (first floor) to check-in and test prep.  All radiology patients and guests should use entrance C2 at [facility], accessed from [facility], even though the hospital's physical address listed is [address]     Please follow these instructions carefully (unless otherwise directed):  Hold all erectile dysfunction medications at least 3 days (72 hrs) prior to test. (Ie viagra, cialis, sildenafil, tadalafil, etc) We will administer nitroglycerin during this exam.   On the Night Before the  Test: Be sure to Drink plenty of water. Do not consume any caffeinated/decaffeinated beverages or chocolate 12 hours prior to your test. Do not take any antihistamines 12 hours prior to your test.  On the Day of the Test: Drink plenty of water until 1 hour prior to the test. Do not eat any food 1 hour prior to test. You may take your regular medications prior to the test.  Take metoprolol (Lopressor) 156m  two hours prior to test.      After the Test: Drink plenty of water. After receiving IV contrast, you may experience a mild flushed feeling. This is normal. On occasion, you may experience a mild rash up to 24 hours after the test. This is not dangerous. If this occurs, you can take Benadryl 25 mg and increase your fluid intake. If you experience trouble breathing, this can be serious. If it is severe call 911 IMMEDIATELY. If it is mild, please call our office. If you take any of these medications: Glipizide/Metformin, Avandament, Glucavance, please do not take 48 hours after completing test unless otherwise instructed.  We will call to schedule your test 2-4 weeks out understanding that some insurance companies will need an authorization prior to the service being performed.   For non-scheduling related questions, please contact the cardiac imaging nurse navigator should you have any questions/concerns: SMarchia Bond Cardiac Imaging Nurse Navigator MGordy Clement Cardiac Imaging Nurse Navigator [facility] Direct Office Dial: [phone]  For scheduling needs, including cancellations and rescheduling, please call [place] [phone]     Signed, TShelva Majestic MD  12/09/2022 1:06 PM    [facility] [address] [place]   [phone]: [phone]

## 2022-12-04 ENCOUNTER — Other Ambulatory Visit: Payer: Self-pay

## 2022-12-04 DIAGNOSIS — Z01812 Encounter for preprocedural laboratory examination: Secondary | ICD-10-CM | POA: Diagnosis not present

## 2022-12-04 DIAGNOSIS — E785 Hyperlipidemia, unspecified: Secondary | ICD-10-CM

## 2022-12-05 LAB — BASIC METABOLIC PANEL
BUN/Creatinine Ratio: 23 — ABNORMAL HIGH (ref 9–20)
BUN: 19 mg/dL (ref 6–24)
CO2: 22 mmol/L (ref 20–29)
Calcium: 9.3 mg/dL (ref 8.7–10.2)
Chloride: 101 mmol/L (ref 96–106)
Creatinine, Ser: 0.83 mg/dL (ref 0.76–1.27)
Glucose: 172 mg/dL — ABNORMAL HIGH (ref 70–99)
Potassium: 4.6 mmol/L (ref 3.5–5.2)
Sodium: 140 mmol/L (ref 134–144)
eGFR: 107 mL/min/{1.73_m2} (ref >59)

## 2022-12-05 LAB — LIPID PANEL
Chol/HDL Ratio: 5.4 ratio — ABNORMAL HIGH (ref 0.0–5.0)
Cholesterol, Total: 215 mg/dL — ABNORMAL HIGH (ref 100–199)
HDL: 40 mg/dL (ref >39)
LDL Chol Calc (NIH): 151 mg/dL — ABNORMAL HIGH (ref 0–99)
Triglycerides: 131 mg/dL (ref 0–149)
VLDL Cholesterol Cal: 24 mg/dL (ref 5–40)

## 2022-12-05 LAB — LIPOPROTEIN A (LPA): Lipoprotein (a): 26.2 nmol/L (ref <75.0)

## 2022-12-09 ENCOUNTER — Encounter: Payer: Self-pay | Admitting: Cardiovascular Disease

## 2022-12-10 ENCOUNTER — Emergency Department (HOSPITAL_BASED_OUTPATIENT_CLINIC_OR_DEPARTMENT_OTHER): Payer: BC Managed Care – PPO

## 2022-12-10 ENCOUNTER — Encounter (HOSPITAL_BASED_OUTPATIENT_CLINIC_OR_DEPARTMENT_OTHER): Payer: Self-pay | Admitting: Emergency Medicine

## 2022-12-10 ENCOUNTER — Telehealth (HOSPITAL_COMMUNITY): Payer: Self-pay | Admitting: Emergency Medicine

## 2022-12-10 ENCOUNTER — Other Ambulatory Visit: Payer: Self-pay

## 2022-12-10 ENCOUNTER — Emergency Department (HOSPITAL_BASED_OUTPATIENT_CLINIC_OR_DEPARTMENT_OTHER)
Admission: EM | Admit: 2022-12-10 | Discharge: 2022-12-10 | Disposition: A | Discharge status: Home / Self Care | Payer: BC Managed Care – PPO | Attending: Emergency Medicine | Admitting: Emergency Medicine

## 2022-12-10 DIAGNOSIS — Z7984 Long term (current) use of oral hypoglycemic drugs: Secondary | ICD-10-CM | POA: Diagnosis not present

## 2022-12-10 DIAGNOSIS — R008 Other abnormalities of heart beat: Secondary | ICD-10-CM | POA: Diagnosis not present

## 2022-12-10 DIAGNOSIS — R002 Palpitations: Secondary | ICD-10-CM | POA: Diagnosis not present

## 2022-12-10 DIAGNOSIS — E1165 Type 2 diabetes mellitus with hyperglycemia: Secondary | ICD-10-CM | POA: Insufficient documentation

## 2022-12-10 DIAGNOSIS — R0789 Other chest pain: Secondary | ICD-10-CM | POA: Diagnosis not present

## 2022-12-10 DIAGNOSIS — R079 Chest pain, unspecified: Secondary | ICD-10-CM | POA: Diagnosis not present

## 2022-12-10 DIAGNOSIS — R519 Headache, unspecified: Secondary | ICD-10-CM | POA: Diagnosis not present

## 2022-12-10 DIAGNOSIS — I498 Other specified cardiac arrhythmias: Secondary | ICD-10-CM

## 2022-12-10 LAB — BASIC METABOLIC PANEL
Anion gap: 9 (ref 5–15)
BUN: 20 mg/dL (ref 6–20)
CO2: 23 mmol/L (ref 22–32)
Calcium: 9.2 mg/dL (ref 8.9–10.3)
Chloride: 103 mmol/L (ref 98–111)
Creatinine, Ser: 0.83 mg/dL (ref 0.61–1.24)
GFR, Estimated: >60 mL/min (ref >60)
Glucose, Bld: 264 mg/dL — ABNORMAL HIGH (ref 70–99)
Potassium: 4.6 mmol/L (ref 3.5–5.1)
Sodium: 135 mmol/L (ref 135–145)

## 2022-12-10 LAB — TROPONIN I (HIGH SENSITIVITY)
Troponin I (High Sensitivity): 5 ng/L (ref <18)
Troponin I (High Sensitivity): 6 ng/L (ref <18)

## 2022-12-10 LAB — CBC
HCT: 44.3 % (ref 39.0–52.0)
Hemoglobin: 14.6 g/dL (ref 13.0–17.0)
MCH: 28.3 pg (ref 26.0–34.0)
MCHC: 33 g/dL (ref 30.0–36.0)
MCV: 85.9 fL (ref 80.0–100.0)
Platelets: 303 10*3/uL (ref 150–400)
RBC: 5.16 MIL/uL (ref 4.22–5.81)
RDW: 14.1 % (ref 11.5–15.5)
WBC: 6.8 10*3/uL (ref 4.0–10.5)
nRBC: 0 % (ref 0.0–0.2)

## 2022-12-10 LAB — D-DIMER, QUANTITATIVE: D-Dimer, Quant: 0.37 ug/mL-FEU (ref 0.00–0.50)

## 2022-12-10 NOTE — ED Notes (Signed)
Discharge paperwork reviewed entirely with patient, including Rx's and follow up care. Pain was under control. Pt verbalized understanding as well as all parties involved. No questions or concerns voiced at the time of discharge. No acute distress noted.   Pt ambulated out to PVA without incident or assistance.  

## 2022-12-10 NOTE — Telephone Encounter (Signed)
Reaching out to patient to offer assistance regarding upcoming cardiac imaging study; pt verbalizes understanding of appt date/time, parking situation and where to check in, pre-test NPO status and medications ordered, and verified current allergies; name and call back number provided for further questions should they arise Marchia Bond RN Navigator Cardiac Imaging [facility] [phone] office [phone] cell   Arrival [address] WC entrance Denies iv issues 169m metoprolol tartrate Aware contrast/nitro

## 2022-12-10 NOTE — ED Triage Notes (Signed)
Pt has been having intermittent chest pain since December.  Pt describes it as a pressure.  Pt denies sob but feels like he needs to take a deep breath.  Pt also having headache and tingling in left side. No diaphoresis.  No N/V.  Pt scheduled for echo tomorrow.

## 2022-12-10 NOTE — ED Provider Notes (Signed)
[facility] Provider Note   CSN: [id number] Arrival date & time: 12/10/22  [id number]     History  Chief Complaint  Patient presents with   Chest Pain    Jerry Eaton is a 52 y.o. male.  Patient with history of diabetes, hyperlipidemia presents to the emergency department for evaluation of chest pressure and fluttering palpitations.  Patient has recently been evaluated by cardiology.  Patient had a trip in December 2023 and had some chest symptoms while walking up a large number of stairs.  He is scheduled for a coronary artery CT tomorrow.  Patient states that he awoke today and had increased fluttering in his chest.  He does report drinking several cups of coffee this morning.  He has had some intermittent chest pressure, radiating to the back, but not currently.  He has the sensation of needing to take a deep breath but denies overt shortness of breath.  He has had some mild tingling in his left hand at times.  No diaphoresis or vomiting.  He does have a strong family history of heart disease in uncles and his father.  He is a smoker.       Home Medications Prior to Admission medications   Medication Sig Start Date End Date Taking? Authorizing Provider  ALPHA-LIPOIC ACID PO Take 2 tablets by mouth daily.    [provider]  b complex vitamins capsule Take 1 capsule by mouth daily.    [provider]  Continuous Blood Gluc Sensor (FREESTYLE LIBRE 3 SENSOR) MISC 1 each by Does not apply route every 14 (fourteen) days. 08/17/22   Philemon Kingdom, MD  dapagliflozin propanediol (FARXIGA) 10 MG TABS tablet Take 1 tablet (10 mg total) by mouth daily before breakfast. 02/09/22   Philemon Kingdom, MD  ezetimibe (ZETIA) 10 MG tablet TAKE ONE TABLET BY MOUTH ONE TIME DAILY 10/01/22   Philemon Kingdom, MD  ibuprofen (ADVIL) 200 MG tablet Take 1-3 tablets (200-600 mg total) by mouth every 8 (eight) hours as needed (with food).  01/18/22   Tonia Ghent, MD  lisinopril (ZESTRIL) 2.5 MG tablet TAKE ONE TABLET BY MOUTH ONE TIME DAILY 10/12/22   Philemon Kingdom, MD  loratadine (CLARITIN) 10 MG tablet Take 1 tablet (10 mg total) by mouth daily. 02/09/20   Tonia Ghent, MD  metFORMIN (GLUCOPHAGE-XR) 500 MG 24 hr tablet Take 2 tablets (1,000 mg total) by mouth daily with breakfast. 06/15/22   Tonia Ghent, MD  metoprolol tartrate (LOPRESSOR) 100 MG tablet Take 2 hours prior to CT 12/03/22   Troy Sine, MD  Multiple Vitamin (MULTIVITAMIN) tablet Take 1 tablet by mouth daily.    [provider]  nitroGLYCERIN (NITROSTAT) 0.4 MG SL tablet Place 1 tablet (0.4 mg total) under the tongue every 5 (five) minutes as needed for chest pain. Max 3 doses in 15 minutes. 11/23/22   Tonia Ghent, MD  rosuvastatin (CRESTOR) 5 MG tablet Take 1 tablet (5 mg total) by mouth daily. 09/24/22   Tonia Ghent, MD  tirzepatide [facility]) 10 MG/0.5ML Pen Inject 10 mg into the skin once a week. Patient not taking: Reported on 12/03/2022 09/24/22   Tonia Ghent, MD      Allergies    Other, Chantix [varenicline tartrate], Januvia [sitagliptin phosphate], and Lipitor [atorvastatin calcium]    Review of Systems   Review of Systems  Physical Exam Updated Vital Signs BP 132/71 (BP Location: Right Arm)  Pulse 76   Temp 98.4 F (36.9 C) (Oral)   Resp 20   Ht 6' (1.829 m)   Wt 130.2 kg   SpO2 99%   BMI 38.92 kg/m   Physical Exam Vitals and nursing note reviewed.  Constitutional:      Appearance: He is well-developed. He is not diaphoretic.  HENT:     Head: Normocephalic and atraumatic.     Mouth/Throat:     Mouth: Mucous membranes are not dry.  Eyes:     Conjunctiva/sclera: Conjunctivae normal.  Neck:     Vascular: Normal carotid pulses. No carotid bruit or JVD.     Trachea: Trachea normal. No tracheal deviation.  Cardiovascular:     Rate and Rhythm: Normal rate and regular rhythm. Extrasystoles are  present.    Pulses: No decreased pulses.          Radial pulses are 2+ on the right side and 2+ on the left side.     Heart sounds: Normal heart sounds, S1 normal and S2 normal. Heart sounds not distant. No murmur heard. Pulmonary:     Effort: Pulmonary effort is normal. No respiratory distress.     Breath sounds: Normal breath sounds. No wheezing.  Chest:     Chest wall: No tenderness.  Abdominal:     General: Bowel sounds are normal.     Palpations: Abdomen is soft.     Tenderness: There is no abdominal tenderness. There is no guarding or rebound.  Musculoskeletal:     Cervical back: Normal range of motion and neck supple. No muscular tenderness.     Right lower leg: No edema.     Left lower leg: No edema.  Skin:    General: Skin is warm and dry.     Coloration: Skin is not pale.  Neurological:     Mental Status: He is alert. Mental status is at baseline.  Psychiatric:        Mood and Affect: Mood normal.     ED Results / Procedures / Treatments   Labs (all labs ordered are listed, but only abnormal results are displayed) Labs Reviewed  BASIC METABOLIC PANEL - Abnormal; Notable for the following components:      Result Value   Glucose, Bld 264 (*)    All other components within normal limits  CBC  D-DIMER, QUANTITATIVE  TROPONIN I (HIGH SENSITIVITY)  TROPONIN I (HIGH SENSITIVITY)    EKG EKG Interpretation  Date/Time:  Monday December 10 2022 09:58:03 EST Ventricular Rate:  80 PR Interval:  161 QRS Duration: 107 QT Interval:  378 QTC Calculation: 436 R Axis:   -71 Text Interpretation: Sinus rhythm Multiple ventricular premature complexes RSR' in V1 or V2, probably normal variant Inferior infarct, old No previous ECGs available Confirmed by Fredia Sorrow [phone]) on 12/10/2022 10:57:31 AM  Radiology DG Chest 2 View  Result Date: 12/10/2022 CLINICAL DATA:  52 year old male with intermittent chest pain since December. Pleuritic pain. Headache. Travel history.  EXAM: CHEST - 2 VIEW COMPARISON:  CT Abdomen and Pelvis 04/17/2016. FINDINGS: PA and lateral views at 1040 hours. Normal lung volumes and mediastinal contours. Visualized tracheal air column is within normal limits. No pneumothorax, pulmonary edema, pleural effusion, or confluent pulmonary opacity. Lung markings appear symmetric and within normal limits. No acute osseous abnormality identified. Negative visible bowel gas. IMPRESSION: No acute cardiopulmonary abnormality. Electronically Signed   By: Genevie Ann M.D.   On: 12/10/2022 10:24    Procedures Procedures  Medications Ordered in ED Medications - No data to display  ED Course/ Medical Decision Making/ A&P    Patient seen and examined. History obtained directly from patient.   Labs/EKG: Labs ordered in triage including CBC, BMP, troponin, D-dimer.  EKG personally reviewed and interpreted as above.  Imaging: Ordered chest x-ray.  Medications/Fluids: None ordered  Most recent vital signs reviewed and are as follows: BP 132/71 (BP Location: Right Arm)   Pulse 76   Temp 98.4 F (36.9 C) (Oral)   Resp 20   Ht 6' (1.829 m)   Wt 130.2 kg   SpO2 99%   BMI 38.92 kg/m   Initial impression: Palpitations, atypical chest pain  While examining patient, on monitor, patient is frequently in ventricular bigeminy, other times normal sinus rhythm.  Frequent PVCs noted.  2:04 PM Reassessment performed. Patient appears stable.  Labs personally reviewed and interpreted including: CBC unremarkable; BMP elevated glucose at 264 without signs of DKA; troponin 5 >> 6.  D-dimer was negative.  Imaging personally visualized and interpreted including: Chest x-ray agree negative.  Reviewed pertinent lab work and imaging with patient at bedside. Questions answered.   Most current vital signs reviewed and are as follows:  BP 111/62   Pulse 67   Temp 98.4 F (36.9 C) (Oral)   Resp (!) 22   Ht 6' (1.829 m)   Wt 130.2 kg   SpO2 98%   BMI 38.92  kg/m   Plan: Discharge to home.   Prescriptions written for: None  Other home care instructions discussed: OTC meds as needed.  Discussed importance of avoiding caffeine and other stimulants as this could worsen symptoms.  Return and follow-up instructions: I encouraged patient to return to ED with severe chest pain, especially if the pain is crushing or pressure-like and spreads to the arms, back, neck, or jaw, or if they have associated sweating, vomiting, or shortness of breath with the pain, or significant pain with activity. We discussed that the evaluation here today indicates a low-risk of serious cause of chest pain, including heart trouble or a blood clot, but no evaluation is perfect and chest pain can evolve with time. The patient and family at bedside verbalized understanding and agreed.     Follow-up instructions discussed: Patient encouraged to follow-up with their cardiologist visit planned tomorrow for CT coronary.                            Medical Decision Making Amount and/or Complexity of Data Reviewed Labs: ordered. Radiology: ordered.   For this patient's complaint of chest pain, the following emergent conditions were considered on the differential diagnosis: acute coronary syndrome, pulmonary embolism, pneumothorax, myocarditis, pericardial tamponade, aortic dissection, thoracic aortic aneurysm complication, esophageal perforation.   Other causes were also considered including: gastroesophageal reflux disease, musculoskeletal pain including costochondritis, pneumonia/pleurisy, herpes zoster, pericarditis.  In regards to possibility of ACS, patient has atypical features of pain, non-ischemic and unchanged EKG and negative troponin(s). Heart score was calculated to be 3.   In regards to possibility of PE, symptoms are atypical for PE and risk profile is low, with negative D-dimer.  He is having a lot of palpations and has frequent PVCs and ventricular bigeminy at  times in the ED.  This will be followed up by cardiology.  Patient encouraged to avoid stimulants and caffeine.  He is not hypotensive or otherwise unstable.         Final Clinical  Impression(s) / ED Diagnoses Final diagnoses:  Palpitations  Ventricular bigeminy  Atypical chest pain    Rx / DC Orders ED Discharge Orders     None         Carlisle Cater, PA-C 12/10/22 1411    Fredia Sorrow, MD 12/12/22 [phone]

## 2022-12-10 NOTE — ED Notes (Signed)
Ambulatory to [facility].

## 2022-12-10 NOTE — Discharge Instructions (Addendum)
Please read and follow all provided instructions.  Your diagnoses today include:  1. Palpitations   2. Ventricular bigeminy   3. Atypical chest pain     Tests performed today include: An EKG of your heart: Shows frequent skipped beats A chest x-ray: Did not show any problems Cardiac enzymes - a blood test for heart muscle damage, both tests did not show any signs of stress on the heart Blood counts and electrolytes Vital signs. See below for your results today.   Medications prescribed:  None  Take any prescribed medications only as directed.  Follow-up instructions: Please follow-up regarding cardiac CT tomorrow.  Please avoid caffeine as this can irritate the heart and cause increased palpitations.  Return instructions:  SEEK IMMEDIATE MEDICAL ATTENTION IF: You have severe chest pain, especially if the pain is crushing or pressure-like and spreads to the arms, back, neck, or jaw, or if you have sweating, nausea or vomiting, or trouble with breathing. THIS IS AN EMERGENCY. Do not wait to see if the pain will go away. Get medical help at once. Call 911. DO NOT drive yourself to the hospital.  Your chest pain gets worse and does not go away after a few minutes of rest.  You have an attack of chest pain lasting longer than what you usually experience.  You have significant dizziness, if you pass out, or have trouble walking.  You have chest pain not typical of your usual pain for which you originally saw your caregiver.  You have any other emergent concerns regarding your health.  Additional Information: Chest pain comes from many different causes. Your caregiver has diagnosed you as having chest pain that is not specific for one problem, but does not require admission.  You are at low risk for an acute heart condition or other serious illness.   Your vital signs today were: BP 111/62   Pulse 67   Temp 98.4 F (36.9 C) (Oral)   Resp (!) 22   Ht 6' (1.829 m)   Wt 130.2 kg    SpO2 98%   BMI 38.92 kg/m  If your blood pressure (BP) was elevated above 135/85 this visit, please have this repeated by your doctor within one month. --------------

## 2022-12-11 ENCOUNTER — Ambulatory Visit (HOSPITAL_COMMUNITY): Admission: RE | Admit: 2022-12-11 | Payer: BC Managed Care – PPO | Source: Ambulatory Visit

## 2022-12-11 ENCOUNTER — Ambulatory Visit (HOSPITAL_COMMUNITY)
Admission: RE | Admit: 2022-12-11 | Discharge: 2022-12-11 | Disposition: A | Discharge status: Home / Self Care | Payer: BC Managed Care – PPO | Source: Ambulatory Visit | Attending: Cardiovascular Disease | Admitting: Cardiovascular Disease

## 2022-12-11 ENCOUNTER — Other Ambulatory Visit: Payer: Self-pay | Admitting: Cardiology

## 2022-12-11 DIAGNOSIS — R072 Precordial pain: Secondary | ICD-10-CM

## 2022-12-11 DIAGNOSIS — I2510 Atherosclerotic heart disease of native coronary artery without angina pectoris: Secondary | ICD-10-CM | POA: Diagnosis not present

## 2022-12-11 DIAGNOSIS — R931 Abnormal findings on diagnostic imaging of heart and coronary circulation: Secondary | ICD-10-CM | POA: Diagnosis not present

## 2022-12-11 MED ORDER — NITROGLYCERIN 0.4 MG SL SUBL: 0.8000 mg | SUBLINGUAL_TABLET | SUBLINGUAL | Status: DC | PRN

## 2022-12-11 MED ORDER — IOHEXOL 350 MG/ML SOLN
95.0000 mL | Freq: Once | INTRAVENOUS | Status: AC | PRN
  Administered 2022-12-11: 95 mL via INTRAVENOUS

## 2022-12-11 MED ORDER — NITROGLYCERIN 0.4 MG SL SUBL
SUBLINGUAL_TABLET | SUBLINGUAL | Status: AC
  Administered 2022-12-11: 0.8 mg via SUBLINGUAL
  Filled 2022-12-11: qty 2

## 2022-12-12 ENCOUNTER — Ambulatory Visit (HOSPITAL_BASED_OUTPATIENT_CLINIC_OR_DEPARTMENT_OTHER)
Admission: RE | Admit: 2022-12-12 | Discharge: 2022-12-12 | Disposition: A | Discharge status: Home / Self Care | Payer: BC Managed Care – PPO | Source: Ambulatory Visit | Attending: Cardiology | Admitting: Cardiology

## 2022-12-12 DIAGNOSIS — R072 Precordial pain: Secondary | ICD-10-CM | POA: Diagnosis not present

## 2022-12-12 DIAGNOSIS — R931 Abnormal findings on diagnostic imaging of heart and coronary circulation: Secondary | ICD-10-CM

## 2022-12-12 DIAGNOSIS — I2510 Atherosclerotic heart disease of native coronary artery without angina pectoris: Secondary | ICD-10-CM

## 2022-12-14 ENCOUNTER — Encounter: Payer: Self-pay | Admitting: Emergency Medicine

## 2022-12-14 ENCOUNTER — Telehealth: Payer: Self-pay | Admitting: Cardiovascular Disease

## 2022-12-14 NOTE — Telephone Encounter (Signed)
Called, no answer, left message with call back number.   His Sleep Study is still in the process of Pre-Certification by his insurance. Once it is approved, a message will be sent to the Sleep Lab to call the patient and schedule the sleep study.  Also, the Cardiac CT performed on 2/21, has not been reviewed by your provider yet. Results will be given after your provider reviews it.  Will send a [employer] as well.

## 2022-12-14 NOTE — Telephone Encounter (Signed)
Pt returned call- gave the following information:  Sleep Study is still in the process of Pre-Certification by his insurance. Once it is approved, a message will be sent to the Sleep Lab to call the patient and schedule the sleep study.   Also, the Cardiac CT performed on 2/21, has not been reviewed by your provider yet. Results will be given after your provider reviews it.  He verbalized understanding; He is just anxious/worried about the results of the CT and was told that the results would be given by the end of the week. I told him that I will send this message to his provider.

## 2022-12-14 NOTE — Telephone Encounter (Signed)
Patient called to follow-up on getting scheduled for a sleep study and would like to get test results.

## 2022-12-19 ENCOUNTER — Other Ambulatory Visit: Payer: Self-pay | Admitting: Cardiovascular Disease

## 2022-12-19 ENCOUNTER — Telehealth: Payer: Self-pay | Admitting: *Deleted

## 2022-12-19 DIAGNOSIS — G4733 Obstructive sleep apnea (adult) (pediatric): Secondary | ICD-10-CM

## 2022-12-19 NOTE — Telephone Encounter (Signed)
Prior Authorization for split night sleep study sent to [facility] via web portal. Request was denied. Patient has no co morbidities. HST was approved.Order Number [id number]. Valid dates 12/19/22 to 02/16/23.

## 2022-12-21 NOTE — Telephone Encounter (Signed)
Called patient, scheduled with Almyra Deforest, PA-C on 03/05   Patient verbalized understanding

## 2022-12-25 ENCOUNTER — Ambulatory Visit: Payer: BC Managed Care – PPO | Attending: Physician Assistant | Admitting: Cardiology

## 2022-12-25 ENCOUNTER — Encounter: Payer: Self-pay | Admitting: Physician Assistant

## 2022-12-25 ENCOUNTER — Other Ambulatory Visit: Payer: Self-pay | Admitting: Cardiology

## 2022-12-25 VITALS — BP 120/82 | HR 96 | Ht 72.0 in | Wt 292.0 lb

## 2022-12-25 DIAGNOSIS — E119 Type 2 diabetes mellitus without complications: Secondary | ICD-10-CM

## 2022-12-25 DIAGNOSIS — Z01818 Encounter for other preprocedural examination: Secondary | ICD-10-CM

## 2022-12-25 DIAGNOSIS — G4733 Obstructive sleep apnea (adult) (pediatric): Secondary | ICD-10-CM

## 2022-12-25 DIAGNOSIS — R072 Precordial pain: Secondary | ICD-10-CM

## 2022-12-25 DIAGNOSIS — E785 Hyperlipidemia, unspecified: Secondary | ICD-10-CM

## 2022-12-25 DIAGNOSIS — R931 Abnormal findings on diagnostic imaging of heart and coronary circulation: Secondary | ICD-10-CM

## 2022-12-25 DIAGNOSIS — R002 Palpitations: Secondary | ICD-10-CM

## 2022-12-25 MED ORDER — ASPIRIN 81 MG PO TBEC: 81.0000 mg | DELAYED_RELEASE_TABLET | Freq: Every day | ORAL | 3 refills | Status: AC

## 2022-12-25 MED ORDER — SODIUM CHLORIDE 0.9% FLUSH: 3.0000 mL | Freq: Two times a day (BID) | INTRAVENOUS | Status: DC

## 2022-12-25 MED ORDER — METOPROLOL TARTRATE 25 MG PO TABS: 25.0000 mg | ORAL_TABLET | Freq: Two times a day (BID) | ORAL | 3 refills | Status: DC

## 2022-12-25 NOTE — H&P (View-Only) (Signed)
N  Cardiology Office Note:    Date:  12/25/2022   ID:  Gurinder E Sperl, DOB 07/08/1971, MRN [id number]  PCP:  Duncan, Graham S, MD   [facility] Providers Cardiologist:  Thomas Kelly, MD     Referring MD: Duncan, Graham S, MD   Chief Complaint  Patient presents with   Follow-up    Discuss CT results     History of Present Illness:    Jerry Eaton is a 52 y.o. male with a hx of DVT, OSA, DM2, HLD, palpitations, GERD, obesity, statin intolerance, tobacco abuse.  He initially establish care with our office on 12/03/2022 with Dr. Kelly, referred by his PCP for DOE, chest pressure in his neck and jaw.  An echo was arranged, which will be completed on 01/01/2023.    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  He presents today for follow-up of his recent coronary CTA.  He states this morning when he was taking his trash out he had pain that radiated up to his jaw, was eventually relieved with rest.  He continues to have intermittent periods of stable angina. He endorses jaw pain, palpitations, occasional dizziness. He denies syncope, n/v, pedal edema, orthopnea, PND. After his recent coronary CTA, he stopped all of his medications except for his antihistamine. Two days ago, he restarted his metformin as he noticed his blood sugar was elevated. He has had a lot of side effects related to medications and felt it was best to just stop them all. Much time was spent discussing his abnormal imaging, and the modifiable risk factors that helped contribute.   Past Medical History:  Diagnosis Date   Clotting disorder (HCC)    DVT   Diabetes mellitus    DVT (deep venous thrombosis) (HCC)    Family history of polyps in the colon    Fatty liver    GERD  (gastroesophageal reflux disease)    History of chicken pox    Hyperlipidemia    no meds now 05-28-16   Obesity    Sleep apnea    not using CPAP at this time 05-28-16    Past Surgical History:  Procedure Laterality Date   COLONOSCOPY     2012, 2017   LUMBAR DISC SURGERY     L4/L5   PILONIDAL CYST EXCISION     2001    Current Medications: Current Meds  Medication Sig   aspirin EC 81 MG tablet Take 1 tablet (81 mg total) by mouth daily. Swallow whole.   Continuous Blood Gluc Sensor (FREESTYLE LIBRE 3 SENSOR) MISC 1 each by Does not apply route every 14 (fourteen) days.   ibuprofen (ADVIL) 200 MG tablet Take 1-3 tablets (200-600 mg total) by mouth every 8 (eight) hours as needed (with food).   loratadine (CLARITIN) 10 MG tablet Take 1 tablet (10 mg total) by mouth daily.   metFORMIN (GLUCOPHAGE-XR) 500 MG 24 hr tablet Take 2 tablets (1,000 mg total) by mouth daily with breakfast. (Patient taking differently: Take 1,000 mg by mouth 2 (two) times daily with a meal.)   metoprolol tartrate (LOPRESSOR) 25 MG tablet Take 1 tablet (25 mg total) by mouth 2 (two) times daily.   Current Facility-Administered Medications for the 12/25/22 encounter (Office Visit) with Indy Kuck,   Tifanny Dollens C, NP  Medication   sodium chloride flush (NS) 0.9 % injection 3 mL     Allergies:   Other, Chantix [varenicline tartrate], Januvia [sitagliptin phosphate], and Lipitor [atorvastatin calcium]   Social History   Socioeconomic History   Marital status: Married    Spouse name: Not on file   Number of children: 0   Years of education: Not on file   Highest education level: Not on file  Occupational History   Occupation: regional service manager  Tobacco Use   Smoking status: Every Day    Packs/day: 1.00    Years: 20.00    Total pack years: 20.00    Types: Cigarettes   Smokeless tobacco: Never   Tobacco comments:    12/03/2022-Patient smokes a pack daily  Substance and Sexual Activity   Alcohol use: Yes     Comment: very rare   Drug use: No   Sexual activity: Yes  Other Topics Concern   Not on file  Social History Narrative   [employer] grad   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 There is no history of Esophageal cancer, Stomach cancer, or Rectal cancer.  ROS:   Please see the history of present illness.    All other systems reviewed and are negative.  EKGs/Labs/Other Studies Reviewed:    The following studies were reviewed today:  12/12/22 coronary CTA with FFR -  Coronary Arteries:  Normal coronary origin.  Right dominance.   Left main: The left main is a large caliber vessel with a normal take off from the left coronary cusp that trifurcates into a LAD, LCX, and ramus intermedius. There is no plaque or stenosis.   Left anterior descending artery: The LAD has minimal (0-24) plaque in the proximal vessel, mild (25-49) plaque in the mid vessel, mild (25-49) diffuse plaque in the distal vessel and moderate (50-69) plaque in the very distal vessel. The LAD gives off 1 diagonal branch with moderate (50-69) proximal disease.   Ramus intermedius: Mild (25-49) ostial stenosis.   Left circumflex artery: The LCX appears to be chronically occluded.   Right coronary artery: The RCA is dominant with normal take off from the right coronary cusp. There is mild (25-49) plaque in the mid vessel and at takeoff of PDA; moderate disease in the very distal vessel prior to small posterolateral. The  RCA terminates as a PDA (mild or 25-49 plaque in the mid vessel) and right posterolateral branch.   Right Atrium: Right atrial size is within normal limits.   Right Ventricle: The right ventricular cavity is within normal limits.   Left Atrium: Left atrial size is normal in size with no left atrial appendage filling defect.   Left Ventricle: The ventricular cavity size is within normal limits. There are no stigmata of prior infarction. There is no abnormal filling defect.   Pulmonary arteries: Normal in size without proximal filling defect.   Pulmonary veins: Normal pulmonary venous drainage.   Pericardium: Normal   thickness with no significant effusion or calcium present.   Cardiac valves: The aortic valve is trileaflet without significant calcification. The mitral valve is normal structure without significant calcification.   Aorta: Normal caliber with no significant disease.   Extra-cardiac findings: See attached radiology report for non-cardiac structures.   IMPRESSION: 1. Coronary calcium score of 716. This was 99 percentile for age-, sex, and race-matched controls.   2. Normal coronary origin with right dominance.   3. CTO Lcx, moderate plaque in the very distal LAD, proximal D1 and otherwise mild disease as outlined.    4. Study will be sent for FFR.   RECOMMENDATIONS: CAD-RADS 5: Total coronary occlusion (100%). Consider cardiac catheterization or viability assessment. Consider symptom-guided anti-ischemic pharmacotherapy as well as risk factor modification per guideline directed care.   EKG:  EKG is  ordered today.  The ekg ordered today demonstrates NSR with left axis deviation, HR 87 bpm.   Recent Labs: 11/23/2022: ALT 21; TSH 0.88 12/10/2022: BUN 20; Creatinine, Ser 0.83; Hemoglobin 14.6; Platelets 303; Potassium 4.6; Sodium 135  Recent Lipid Panel    Component Value Date/Time   CHOL 215 (H) 12/04/2022 0822   TRIG 131 12/04/2022 0822   HDL 40  12/04/2022 0822   CHOLHDL 5.4 (H) 12/04/2022 0822   CHOLHDL 4.5 06/15/2022 1504   VLDL 22.8 10/06/2021 0820   LDLCALC 151 (H) 12/04/2022 0822   LDLCALC 90 06/15/2022 1504   LDLDIRECT 163.5 05/16/2012 0812     Risk Assessment/Calculations:                Physical Exam:    VS:  BP 120/82   Pulse 96   Ht 6' (1.829 m)   Wt 292 lb (132.5 kg)   SpO2 97%   BMI 39.60 kg/m     Wt Readings from Last 3 Encounters:  12/25/22 292 lb (132.5 kg)  12/10/22 287 lb (130.2 kg)  12/03/22 287 lb (130.2 kg)     GEN:  Well nourished, well developed in no acute distress HEENT: Normal NECK: No JVD; No carotid bruits LYMPHATICS: No lymphadenopathy CARDIAC: RRR, no murmurs, rubs, gallops RESPIRATORY:  Clear to auscultation without rales, wheezing or rhonchi  ABDOMEN: Soft, non-tender, non-distended MUSCULOSKELETAL:  No edema; No deformity  SKIN: Warm and dry NEUROLOGIC:  Alert and oriented x 3 PSYCHIATRIC:  Normal affect   ASSESSMENT:    1. Abnormal findings diagnostic imaging of heart and coronary circulation   2. Hyperlipidemia, unspecified hyperlipidemia type   3. Precordial pain   4. Type 2 diabetes mellitus without complication, without long-term current use of insulin (HCC)   5. OSA (obstructive sleep apnea)   6. Pre-procedural examination    PLAN:    In order of problems listed above:  Abnormal findings on coronary CTA -CTO of the LCx, flow-limiting obstruction of the proximal diagonal.  He endorses neck, jaw pain, dizziness.  Discussed medical management versus attempt at vascularization at great length.  Consulted with DOD, Dr. Schumann, who felt that either direction would be acceptable.  The patient would like to attempt revascularization with LHC, he is aware that the vessels in question are very small and might not be amenable for stent placement. Start ASA 81 mg daily, metoprolol tartrate 25 mg twice/day.   Precordial pain - Continues to have pain as outlined above.  Will proceed with LHC per above. Start metoprolol and ASA per above.   Palpitations - have been bothersome for some time, recently evaluated in the ED and PVCs   were noted on EKG. Starting metoprolol per above. Will arrange monitor after upcoming cath is completed to assess for burden of PVCs.   HLD -LDL on 12/04/2022 was elevated at 151, was previously on Crestor 5 mg daily, however reported he was only taking Zetia, which he discontinued.  He has a history of statin intolerance.  Will refer to Pharm.D. for evaluation of PCSK9i.   DM2 - manages by endocrinology, he recently stopped his metformin but restarted it two days ago. Stopped his lisinopril when he stopped all of his other medications, restart lisinopril, restart Farxiga. Will repeat renal function prior to LHC.    Disposition - left heart catheterization; start metoprolol tartrate 25 mg twice daily; start ASA 81 mg daily; at follow up visit plan for 7 day Zio monitor.        Shared Decision Making/Informed Consent The risks [stroke (1 in 1000), death (1 in 1000), kidney failure [usually temporary] (1 in 500), bleeding (1 in 200), allergic reaction [possibly serious] (1 in 200)], benefits (diagnostic support and management of coronary artery disease) and alternatives of a cardiac catheterization were discussed in detail with Mr. Watts and he is willing to proceed.    Medication Adjustments/Labs and Tests Ordered: Current medicines are reviewed at length with the patient today.  Concerns regarding medicines are outlined above.  Orders Placed This Encounter  Procedures   Basic metabolic panel   CBC   AMB Referral to Heartcare Pharm-D   EKG 12-Lead   Meds ordered this encounter  Medications   aspirin EC 81 MG tablet    Sig: Take 1 tablet (81 mg total) by mouth daily. Swallow whole.    Dispense:  90 tablet    Refill:  3   metoprolol tartrate (LOPRESSOR) 25 MG tablet    Sig: Take 1 tablet (25 mg total) by mouth 2 (two) times  daily.    Dispense:  180 tablet    Refill:  3    Patient Instructions  Medication Instructions:  START Aspirin 81 mg daily-1 tablet by mouth daily  START Metoprolol Tartrate (Lopressor) 25 mg-1 tablet by mouth 2 times a day  RESTART Farxiga 10 mg daily-1 tablet by mouth daily  RESTART Lisinopril 2.5 mg daily-1 tablet by mouth daily  *If you need a refill on your cardiac medications before your next appointment, please call your pharmacy*  Lab Work: Your physician recommends that you return for lab work 1 week prior to heart cath :  BMP CBC  If you have labs (blood work) drawn today and your tests are completely normal, you will receive your results only by: MyChart Message (if you have MyChart) OR A paper copy in the mail If you have any lab test that is abnormal or we need to change your treatment, we will call you to review the results.  Testing/Procedures: Your physician has requested that you have a cardiac catheterization. Cardiac catheterization is used to diagnose and/or treat various heart conditions. Doctors may recommend this procedure for a number of different reasons. The most common reason is to evaluate chest pain. Chest pain can be a symptom of coronary artery disease (CAD), and cardiac catheterization can show whether plaque is narrowing or blocking your heart's arteries. This procedure is also used to evaluate the valves, as well as measure the blood flow and oxygen levels in different parts of your heart. For further information please visit www.cardiosmart.org. Please follow instruction sheet, as given.  Scheduled for 01/14/23 at 7:30 AM with Dr.   Jordan  Follow-Up: At [facility], you and your health needs are our priority.  As part of our continuing mission to provide you with exceptional heart care, we have created designated Provider Care Teams.  These Care Teams include your primary Cardiologist (physician) and Advanced Practice Providers (APPs -   Physician Assistants and Nurse Practitioners) who all work together to provide you with the care you need, when you need it.   Your next appointment:   1-2 week(s) post heart cath  Provider:   APP        Other Instructions         Cardiac/Peripheral Catheterization   You are scheduled for a Cardiac Catheterization on Monday, March 25 with Dr. Peter Jordan.  1. Please arrive at the Main Entrance A at [facility]: [address] [place], [place] [address] on March 25 at 5:30 AM (This time is two hours before your procedure to ensure your preparation). Free valet parking service is available. You will check in at ADMITTING. The support person will be asked to wait in the waiting room.  It is OK to have someone drop you off and come back when you are ready to be discharged.        Special note: Every effort is made to have your procedure done on time. Please understand that emergencies sometimes delay scheduled procedures.   . 2. Diet: Do not eat solid foods after midnight.  You may have clear liquids until 5 AM the day of the procedure.  3. Labs: You will need to have blood drawn on Monday, March 18 at [facility] [address], [place]  Open: 8am - 5pm (Lunch 12:30 - 1:30)   Phone: [phone]. You do not need to be fasting.  4. Medication instructions in preparation for your procedure:   Contrast Allergy: No   Stop taking, Lisinopril (Zestril or Prinivil) Sunday, March 24,  Hold Mounjaro beginning 01/07/23 Restart on 01/15/23  Do not take Diabetes Med Glucophage (Metformin) on the day of the procedure and HOLD 48 HOURS AFTER THE PROCEDURE.  On the morning of your procedure, take Aspirin 81 mg and any morning medicines NOT listed above.  You may use sips of water.  5. Plan to go home the same day, you will only stay overnight if medically necessary. 6. You MUST have a responsible adult to drive you home. 7. An adult MUST be with you the first 24  hours after you arrive home. 8. Bring a current list of your medications, and the last time and date medication taken. 9. Bring ID and current insurance cards. 10.Please wear clothes that are easy to get on and off and wear slip-on shoes.  Thank you for allowing us to care for you!   -- [facility]     Signed, Twanisha Foulk C Lovett Coffin, NP  12/25/2022 4:54 PM    [facility] 

## 2022-12-25 NOTE — Progress Notes (Signed)
N  Cardiology Office Note:    Date:  12/25/2022   ID:  Jerry Eaton, DOB February 08, 1971, MRN [id number]  PCP:  Tonia Ghent, MD   [place] [facility] Cardiologist:  Shelva Majestic, MD     Referring MD: Tonia Ghent, MD   Chief Complaint  Patient presents with   Follow-up    Discuss CT results     History of Present Illness:    Jerry Eaton is a 52 y.o. male with a hx of DVT, OSA, DM2, HLD, palpitations, GERD, obesity, statin intolerance, tobacco abuse.  He initially establish care with our office on 12/03/2022 with Dr. Claiborne Billings, referred by his PCP for DOE, chest pressure in his neck and jaw.  An echo was arranged, which will be completed on 01/01/2023.    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  He presents today for follow-up of his recent coronary CTA.  He states this morning when he was taking his trash out he had pain that radiated up to his jaw, was eventually relieved with rest.  He continues to have intermittent periods of stable angina. He endorses jaw pain, palpitations, occasional dizziness. He denies syncope, n/v, pedal edema, orthopnea, PND. After his recent coronary CTA, he stopped all of his medications except for his antihistamine. Two days ago, he restarted his metformin as he noticed his blood sugar was elevated. He has had a lot of side effects related to medications and felt it was best to just stop them all. Much time was spent discussing his abnormal imaging, and the modifiable risk factors that helped contribute.   Past Medical History:  Diagnosis Date   Clotting disorder ([place])    DVT   Diabetes mellitus    DVT (deep venous thrombosis) ([place])    Family history of polyps in the colon    Fatty liver    GERD  (gastroesophageal reflux disease)    History of chicken pox    Hyperlipidemia    no meds now 05-28-16   Obesity    Sleep apnea    not using CPAP at this time 05-28-16    Past Surgical History:  Procedure Laterality Date   COLONOSCOPY     2012, 2017   LUMBAR DISC SURGERY     L4/L5   PILONIDAL CYST EXCISION     2001    Current Medications: Current Meds  Medication Sig   aspirin EC 81 MG tablet Take 1 tablet (81 mg total) by mouth daily. Swallow whole.   Continuous Blood Gluc Sensor (FREESTYLE LIBRE 3 SENSOR) MISC 1 each by Does not apply route every 14 (fourteen) days.   ibuprofen (ADVIL) 200 MG tablet Take 1-3 tablets (200-600 mg total) by mouth every 8 (eight) hours as needed (with food).   loratadine (CLARITIN) 10 MG tablet Take 1 tablet (10 mg total) by mouth daily.   metFORMIN (GLUCOPHAGE-XR) 500 MG 24 hr tablet Take 2 tablets (1,000 mg total) by mouth daily with breakfast. (Patient taking differently: Take 1,000 mg by mouth 2 (two) times daily with a meal.)   metoprolol tartrate (LOPRESSOR) 25 MG tablet Take 1 tablet (25 mg total) by mouth 2 (two) times daily.   Current Facility-Administered Medications for the 12/25/22 encounter (Office Visit) with Gretta Cool,  Gerline Legacy, NP  Medication   sodium chloride flush (NS) 0.9 % injection 3 mL     Allergies:   Other, Chantix [varenicline tartrate], Januvia [sitagliptin phosphate], and Lipitor [atorvastatin calcium]   Social History   Socioeconomic History   Marital status: Married    Spouse name: Not on file   Number of children: 0   Years of education: Not on file   Highest education level: Not on file  Occupational History   Occupation: Neurosurgeon  Tobacco Use   Smoking status: Every Day    Packs/day: 1.00    Years: 20.00    Total pack years: 20.00    Types: Cigarettes   Smokeless tobacco: Never   Tobacco comments:    12/03/2022-Patient smokes a pack daily  Substance and Sexual Activity   Alcohol use: Yes     Comment: very rare   Drug use: No   Sexual activity: Yes  Other Topics Concern   Not on file  Social History Narrative   [employer] grad   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 There is no history of Esophageal cancer, Stomach cancer, or Rectal cancer.  ROS:   Please see the history of present illness.    All other systems reviewed and are negative.  EKGs/Labs/Other Studies Reviewed:    The following studies were reviewed today:  12/12/22 coronary CTA with FFR -  Coronary Arteries:  Normal coronary origin.  Right dominance.   Left main: The left main is a large caliber vessel with a normal take off from the left coronary cusp that trifurcates into a LAD, LCX, and ramus intermedius. There is no plaque or stenosis.   Left anterior descending artery: The LAD has minimal (0-24) plaque in the proximal vessel, mild (25-49) plaque in the mid vessel, mild (25-49) diffuse plaque in the distal vessel and moderate (50-69) plaque in the very distal vessel. The LAD gives off 1 diagonal branch with moderate (50-69) proximal disease.   Ramus intermedius: Mild (25-49) ostial stenosis.   Left circumflex artery: The LCX appears to be chronically occluded.   Right coronary artery: The RCA is dominant with normal take off from the right coronary cusp. There is mild (25-49) plaque in the mid vessel and at takeoff of PDA; moderate disease in the very distal vessel prior to small posterolateral. The  RCA terminates as a PDA (mild or 25-49 plaque in the mid vessel) and right posterolateral branch.   Right Atrium: Right atrial size is within normal limits.   Right Ventricle: The right ventricular cavity is within normal limits.   Left Atrium: Left atrial size is normal in size with no left atrial appendage filling defect.   Left Ventricle: The ventricular cavity size is within normal limits. There are no stigmata of prior infarction. There is no abnormal filling defect.   Pulmonary arteries: Normal in size without proximal filling defect.   Pulmonary veins: Normal pulmonary venous drainage.   Pericardium: Normal  thickness with no significant effusion or calcium present.   Cardiac valves: The aortic valve is trileaflet without significant calcification. The mitral valve is normal structure without significant calcification.   Aorta: Normal caliber with no significant disease.   Extra-cardiac findings: See attached radiology report for non-cardiac structures.   IMPRESSION: 1. Coronary calcium score of 716. This was 93 percentile for age-, sex, and race-matched controls.   2. Normal coronary origin with right dominance.   3. CTO Lcx, moderate plaque in the very distal LAD, proximal D1 and otherwise mild disease as outlined.    4. Study will be sent for FFR.   RECOMMENDATIONS: CAD-RADS 5: Total coronary occlusion (100%). Consider cardiac catheterization or viability assessment. Consider symptom-guided anti-ischemic pharmacotherapy as well as risk factor modification per guideline directed care.   EKG:  EKG is  ordered today.  The ekg ordered today demonstrates NSR with left axis deviation, HR 87 bpm.   Recent Labs: 11/23/2022: ALT 21; TSH 0.88 12/10/2022: BUN 20; Creatinine, Ser 0.83; Hemoglobin 14.6; Platelets 303; Potassium 4.6; Sodium 135  Recent Lipid Panel    Component Value Date/Time   CHOL 215 (H) 12/04/2022 0822   TRIG 131 12/04/2022 0822   HDL 40  12/04/2022 0822   CHOLHDL 5.4 (H) 12/04/2022 0822   CHOLHDL 4.5 06/15/2022 1504   VLDL 22.8 10/06/2021 0820   LDLCALC 151 (H) 12/04/2022 0822   LDLCALC 90 06/15/2022 1504   LDLDIRECT 163.5 05/16/2012 0812     Risk Assessment/Calculations:                Physical Exam:    VS:  BP 120/82   Pulse 96   Ht 6' (1.829 m)   Wt 292 lb (132.5 kg)   SpO2 97%   BMI 39.60 kg/m     Wt Readings from Last 3 Encounters:  12/25/22 292 lb (132.5 kg)  12/10/22 287 lb (130.2 kg)  12/03/22 287 lb (130.2 kg)     GEN:  Well nourished, well developed in no acute distress HEENT: Normal NECK: No JVD; No carotid bruits LYMPHATICS: No lymphadenopathy CARDIAC: RRR, no murmurs, rubs, gallops RESPIRATORY:  Clear to auscultation without rales, wheezing or rhonchi  ABDOMEN: Soft, non-tender, non-distended MUSCULOSKELETAL:  No edema; No deformity  SKIN: Warm and dry NEUROLOGIC:  Alert and oriented x 3 PSYCHIATRIC:  Normal affect   ASSESSMENT:    1. Abnormal findings diagnostic imaging of heart and coronary circulation   2. Hyperlipidemia, unspecified hyperlipidemia type   3. Precordial pain   4. Type 2 diabetes mellitus without complication, without long-term current use of insulin ([place])   5. OSA (obstructive sleep apnea)   6. Pre-procedural examination    PLAN:    In order of problems listed above:  Abnormal findings on coronary CTA -CTO of the LCx, flow-limiting obstruction of the proximal diagonal.  He endorses neck, jaw pain, dizziness.  Discussed medical management versus attempt at vascularization at great length.  Consulted with DOD, Dr. Gardiner Rhyme, who felt that either direction would be acceptable.  The patient would like to attempt revascularization with LHC, he is aware that the vessels in question are very small and might not be amenable for stent placement. Start ASA 81 mg daily, metoprolol tartrate 25 mg twice/day.   Precordial pain - Continues to have pain as outlined above.  Will proceed with LHC per above. Start metoprolol and ASA per above.   Palpitations - have been bothersome for some time, recently evaluated in the ED and PVCs  were noted on EKG. Starting metoprolol per above. Will arrange monitor after upcoming cath is completed to assess for burden of PVCs.   HLD -LDL on 12/04/2022 was elevated at 151, was previously on Crestor 5 mg daily, however reported he was only taking Zetia, which he discontinued.  He has a history of statin intolerance.  Will refer to Pharm.D. for evaluation of PCSK9i.   DM2 - manages by endocrinology, he recently stopped his metformin but restarted it two days ago. Stopped his lisinopril when he stopped all of his other medications, restart lisinopril, restart [place]. Will repeat renal function prior to [facility].    Disposition - left heart catheterization; start metoprolol tartrate 25 mg twice daily; start ASA 81 mg daily; at follow up visit plan for 7 day Zio monitor.        Shared Decision Making/Informed Consent The risks [stroke (1 in 1000), death (1 in 1000), kidney failure [usually temporary] (1 in 500), bleeding (1 in 200), allergic reaction [possibly serious] (1 in 200)], benefits (diagnostic support and management of coronary artery disease) and alternatives of a cardiac catheterization were discussed in detail with Jerry Eaton and he is willing to proceed.    Medication Adjustments/Labs and Tests Ordered: Current medicines are reviewed at length with the patient today.  Concerns regarding medicines are outlined above.  Orders Placed This Encounter  Procedures   Basic metabolic panel   CBC   AMB Referral to Heartcare Pharm-D   EKG 12-Lead   Meds ordered this encounter  Medications   aspirin EC 81 MG tablet    Sig: Take 1 tablet (81 mg total) by mouth daily. Swallow whole.    Dispense:  90 tablet    Refill:  3   metoprolol tartrate (LOPRESSOR) 25 MG tablet    Sig: Take 1 tablet (25 mg total) by mouth 2 (two) times  daily.    Dispense:  180 tablet    Refill:  3    Patient Instructions  Medication Instructions:  START Aspirin 81 mg daily-1 tablet by mouth daily  START Metoprolol Tartrate (Lopressor) 25 mg-1 tablet by mouth 2 times a day  RESTART Farxiga 10 mg daily-1 tablet by mouth daily  RESTART Lisinopril 2.5 mg daily-1 tablet by mouth daily  *If you need a refill on your cardiac medications before your next appointment, please call your pharmacy*  Lab Work: Your physician recommends that you return for lab work 1 week prior to heart cath :  BMP CBC  If you have labs (blood work) drawn today and your tests are completely normal, you will receive your results only by: [place] (if you have MyChart) OR A paper copy in the mail If you have any lab test that is abnormal or we need to change your treatment, we will call you to review the results.  Testing/Procedures: Your physician has requested that you have a cardiac catheterization. Cardiac catheterization is used to diagnose and/or treat various heart conditions. Doctors may recommend this procedure for a number of different reasons. The most common reason is to evaluate chest pain. Chest pain can be a symptom of coronary artery disease (CAD), and cardiac catheterization can show whether plaque is narrowing or blocking your heart's arteries. This procedure is also used to evaluate the valves, as well as measure the blood flow and oxygen levels in different parts of your heart. For further information please visit HugeFiesta.tn. Please follow instruction sheet, as given.  Scheduled for 01/14/23 at 7:30 AM with Dr.  Martinique  Follow-Up: At [facility], you and your health needs are our priority.  As part of our continuing mission to provide you with exceptional heart care, we have created designated Provider Care Teams.  These Care Teams include your primary Cardiologist (physician) and Advanced Practice Providers (APPs -   Physician Assistants and Nurse Practitioners) who all work together to provide you with the care you need, when you need it.   Your next appointment:   1-2 week(s) post heart cath  Provider:   APP        Other Instructions         Cardiac/Peripheral Catheterization   You are scheduled for a Cardiac Catheterization on Monday, March 25 with Dr. Peter Martinique.  1. Please arrive at the Main Entrance A at [facility]: [place], [place] [address] on March 25 at 5:30 AM (This time is two hours before your procedure to ensure your preparation). Free valet parking service is available. You will check in at ADMITTING. The support person will be asked to wait in the waiting room.  It is OK to have someone drop you off and come back when you are ready to be discharged.        Special note: Every effort is made to have your procedure done on time. Please understand that emergencies sometimes delay scheduled procedures.   . 2. Diet: Do not eat solid foods after midnight.  You may have clear liquids until 5 AM the day of the procedure.  3. Labs: You will need to have blood drawn on Monday, March 18 at [place]  Open: 8am - 5pm (Lunch 12:30 - 1:30)   Phone: [phone]. You do not need to be fasting.  4. Medication instructions in preparation for your procedure:   Contrast Allergy: No   Stop taking, Lisinopril (Zestril or Prinivil) Sunday, March 24,  Hold Mounjaro beginning 01/07/23 Restart on 01/15/23  Do not take Diabetes Med Glucophage (Metformin) on the day of the procedure and HOLD 48 HOURS AFTER THE PROCEDURE.  On the morning of your procedure, take Aspirin 81 mg and any morning medicines NOT listed above.  You may use sips of water.  5. Plan to go home the same day, you will only stay overnight if medically necessary. 6. You MUST have a responsible adult to drive you home. 7. An adult MUST be with you the first 24  hours after you arrive home. 8. Bring a current list of your medications, and the last time and date medication taken. 9. Bring ID and current insurance cards. 10.Please wear clothes that are easy to get on and off and wear slip-on shoes.  Thank you for allowing [place] to care for you!   -- [facility] Invasive Cardiovascular services     Signed, Trudi Ida, NP  12/25/2022 4:54 PM    [place]

## 2022-12-25 NOTE — Patient Instructions (Addendum)
Medication Instructions:  START Aspirin 81 mg daily-1 tablet by mouth daily  START Metoprolol Tartrate (Lopressor) 25 mg-1 tablet by mouth 2 times a day  RESTART Farxiga 10 mg daily-1 tablet by mouth daily  RESTART Lisinopril 2.5 mg daily-1 tablet by mouth daily  *If you need a refill on your cardiac medications before your next appointment, please call your pharmacy*  Lab Work: Your physician recommends that you return for lab work 1 week prior to heart cath :  BMP CBC  If you have labs (blood work) drawn today and your tests are completely normal, you will receive your results only by: [place] (if you have MyChart) OR A paper copy in the mail If you have any lab test that is abnormal or we need to change your treatment, we will call you to review the results.  Testing/Procedures: Your physician has requested that you have a cardiac catheterization. Cardiac catheterization is used to diagnose and/or treat various heart conditions. Doctors may recommend this procedure for a number of different reasons. The most common reason is to evaluate chest pain. Chest pain can be a symptom of coronary artery disease (CAD), and cardiac catheterization can show whether plaque is narrowing or blocking your heart's arteries. This procedure is also used to evaluate the valves, as well as measure the blood flow and oxygen levels in different parts of your heart. For further information please visit HugeFiesta.tn. Please follow instruction sheet, as given.  Scheduled for 01/14/23 at 7:30 AM with Dr. Martinique  Follow-Up: At [facility], you and your health needs are our priority.  As part of our continuing mission to provide you with exceptional heart care, we have created designated Provider Care Teams.  These Care Teams include your primary Cardiologist (physician) and Advanced Practice Providers (APPs -  Physician Assistants and Nurse Practitioners) who all work together to provide you  with the care you need, when you need it.   Your next appointment:   1-2 week(s) post heart cath  Provider:   APP        Other Instructions         Cardiac/Peripheral Catheterization   You are scheduled for a Cardiac Catheterization on Monday, March 25 with Dr. Peter Martinique.  1. Please arrive at the Main Entrance A at [facility]: [place], [place] [address] on March 25 at 5:30 AM (This time is two hours before your procedure to ensure your preparation). Free valet parking service is available. You will check in at ADMITTING. The support person will be asked to wait in the waiting room.  It is OK to have someone drop you off and come back when you are ready to be discharged.        Special note: Every effort is made to have your procedure done on time. Please understand that emergencies sometimes delay scheduled procedures.   . 2. Diet: Do not eat solid foods after midnight.  You may have clear liquids until 5 AM the day of the procedure.  3. Labs: You will need to have blood drawn on Monday, March 18 at [place]  Open: 8am - 5pm (Lunch 12:30 - 1:30)   Phone: [phone]. You do not need to be fasting.  4. Medication instructions in preparation for your procedure:   Contrast Allergy: No   Stop taking, Lisinopril (Zestril or Prinivil) Sunday, March 24,  Hold Mounjaro beginning 01/07/23 Restart on 01/15/23  Do not  take Diabetes Med Glucophage (Metformin) on the day of the procedure and HOLD 48 HOURS AFTER THE PROCEDURE.  On the morning of your procedure, take Aspirin 81 mg and any morning medicines NOT listed above.  You may use sips of water.  5. Plan to go home the same day, you will only stay overnight if medically necessary. 6. You MUST have a responsible adult to drive you home. 7. An adult MUST be with you the first 24 hours after you arrive home. 8. Bring a current list of your medications, and the last  time and date medication taken. 9. Bring ID and current insurance cards. 10.Please wear clothes that are easy to get on and off and wear slip-on shoes.  Thank you for allowing [place] to care for you!   -- [place] Invasive Cardiovascular services

## 2022-12-28 ENCOUNTER — Other Ambulatory Visit: Payer: Self-pay | Admitting: *Deleted

## 2022-12-28 DIAGNOSIS — G4733 Obstructive sleep apnea (adult) (pediatric): Secondary | ICD-10-CM

## 2023-01-01 ENCOUNTER — Ambulatory Visit (HOSPITAL_COMMUNITY): Payer: BC Managed Care – PPO | Attending: Cardiovascular Disease

## 2023-01-01 DIAGNOSIS — R9431 Abnormal electrocardiogram [ECG] [EKG]: Secondary | ICD-10-CM | POA: Insufficient documentation

## 2023-01-01 LAB — ECHOCARDIOGRAM COMPLETE
Area-P 1/2: 4.17 cm2
S' Lateral: 3.9 cm

## 2023-01-01 MED ORDER — PERFLUTREN LIPID MICROSPHERE
3.0000 mL | INTRAVENOUS | Status: AC | PRN
  Administered 2023-01-01: 3 mL via INTRAVENOUS

## 2023-01-08 DIAGNOSIS — Z01818 Encounter for other preprocedural examination: Secondary | ICD-10-CM | POA: Diagnosis not present

## 2023-01-08 DIAGNOSIS — R072 Precordial pain: Secondary | ICD-10-CM | POA: Diagnosis not present

## 2023-01-09 ENCOUNTER — Telehealth: Payer: Self-pay

## 2023-01-09 LAB — BASIC METABOLIC PANEL WITH GFR
BUN/Creatinine Ratio: 20 (ref 9–20)
BUN: 17 mg/dL (ref 6–24)
CO2: 22 mmol/L (ref 20–29)
Calcium: 9.7 mg/dL (ref 8.7–10.2)
Chloride: 104 mmol/L (ref 96–106)
Creatinine, Ser: 0.85 mg/dL (ref 0.76–1.27)
Glucose: 181 mg/dL — ABNORMAL HIGH (ref 70–99)
Potassium: 5.2 mmol/L (ref 3.5–5.2)
Sodium: 142 mmol/L (ref 134–144)
eGFR: 107 mL/min/1.73

## 2023-01-09 LAB — CBC
Hematocrit: 45.6 % (ref 37.5–51.0)
Hemoglobin: 15.2 g/dL (ref 13.0–17.7)
MCH: 28.4 pg (ref 26.6–33.0)
MCHC: 33.3 g/dL (ref 31.5–35.7)
MCV: 85 fL (ref 79–97)
Platelets: 293 x10E3/uL (ref 150–450)
RBC: 5.36 x10E6/uL (ref 4.14–5.80)
RDW: 12.5 % (ref 11.6–15.4)
WBC: 6.3 x10E3/uL (ref 3.4–10.8)

## 2023-01-09 NOTE — Telephone Encounter (Signed)
Patient is aware of lab results. Verbalized understanding. 

## 2023-01-09 NOTE — Telephone Encounter (Signed)
-----   Message from Trudi Ida, NP sent at 01/09/2023  9:08 AM EDT ----- Mr. [name], your recent lab testing for your upcoming cath was largely normal. Your glucose was elevated, but everything else was normal. Best, Anderson [place]

## 2023-01-10 ENCOUNTER — Telehealth: Payer: Self-pay | Admitting: *Deleted

## 2023-01-10 NOTE — Telephone Encounter (Addendum)
Cardiac Catheterization scheduled at [facility] for: Monday January 14, 2023 7:30 AM Arrival time [place] Entrance A at: 5:30 AM  Nothing to eat after midnight prior to procedure, clear liquids until 5 AM day of procedure.  Medication instructions: -Hold:  Metformin-day of procedure and 48 hours post procedure  Farxiga-AM of procedure  Mounjaro-pt reports currently not taking-last dose more than 1 week ago.   -Other usual morning medications can be taken with sips of water including aspirin 81 mg.  Confirmed patient has responsible adult to drive home post procedure and be with patient first 24 hours after arriving home.  Plan to go home the same day, you will only stay overnight if medically necessary.  Reviewed procedure instructions with patient.

## 2023-01-14 ENCOUNTER — Encounter (HOSPITAL_COMMUNITY): Payer: Self-pay | Admitting: Cardiology

## 2023-01-14 ENCOUNTER — Other Ambulatory Visit: Payer: Self-pay

## 2023-01-14 ENCOUNTER — Ambulatory Visit (HOSPITAL_COMMUNITY)
Admission: RE | Admit: 2023-01-14 | Discharge: 2023-01-14 | Disposition: A | Discharge status: Home / Self Care | Payer: BC Managed Care – PPO | Attending: Cardiology | Admitting: Cardiology

## 2023-01-14 ENCOUNTER — Ambulatory Visit (HOSPITAL_COMMUNITY)
Admission: RE | Disposition: A | Discharge status: Home / Self Care | Payer: Self-pay | Source: Home / Self Care | Attending: Cardiology

## 2023-01-14 DIAGNOSIS — E119 Type 2 diabetes mellitus without complications: Secondary | ICD-10-CM | POA: Insufficient documentation

## 2023-01-14 DIAGNOSIS — F17210 Nicotine dependence, cigarettes, uncomplicated: Secondary | ICD-10-CM | POA: Diagnosis not present

## 2023-01-14 DIAGNOSIS — E669 Obesity, unspecified: Secondary | ICD-10-CM | POA: Diagnosis not present

## 2023-01-14 DIAGNOSIS — Z7984 Long term (current) use of oral hypoglycemic drugs: Secondary | ICD-10-CM | POA: Insufficient documentation

## 2023-01-14 DIAGNOSIS — E785 Hyperlipidemia, unspecified: Secondary | ICD-10-CM | POA: Insufficient documentation

## 2023-01-14 DIAGNOSIS — K219 Gastro-esophageal reflux disease without esophagitis: Secondary | ICD-10-CM | POA: Insufficient documentation

## 2023-01-14 DIAGNOSIS — Z86718 Personal history of other venous thrombosis and embolism: Secondary | ICD-10-CM | POA: Insufficient documentation

## 2023-01-14 DIAGNOSIS — Z79899 Other long term (current) drug therapy: Secondary | ICD-10-CM | POA: Diagnosis not present

## 2023-01-14 DIAGNOSIS — Z6839 Body mass index (BMI) 39.0-39.9, adult: Secondary | ICD-10-CM | POA: Insufficient documentation

## 2023-01-14 DIAGNOSIS — G4733 Obstructive sleep apnea (adult) (pediatric): Secondary | ICD-10-CM | POA: Diagnosis not present

## 2023-01-14 DIAGNOSIS — I25118 Atherosclerotic heart disease of native coronary artery with other forms of angina pectoris: Secondary | ICD-10-CM

## 2023-01-14 HISTORY — PX: LEFT HEART CATH AND CORONARY ANGIOGRAPHY: CATH118249

## 2023-01-14 LAB — GLUCOSE, CAPILLARY: Glucose-Capillary: 167 mg/dL — ABNORMAL HIGH (ref 70–99)

## 2023-01-14 SURGERY — LEFT HEART CATH AND CORONARY ANGIOGRAPHY
Anesthesia: LOCAL

## 2023-01-14 MED ORDER — MIDAZOLAM HCL 2 MG/2ML IJ SOLN
INTRAMUSCULAR | Status: AC
  Filled 2023-01-14: qty 2

## 2023-01-14 MED ORDER — SODIUM CHLORIDE 0.9 % WEIGHT BASED INFUSION
3.0000 mL/kg/h | INTRAVENOUS | Status: AC
  Administered 2023-01-14: 3 mL/kg/h via INTRAVENOUS

## 2023-01-14 MED ORDER — SODIUM CHLORIDE 0.9 % WEIGHT BASED INFUSION: 1.0000 mL/kg/h | INTRAVENOUS | Status: DC

## 2023-01-14 MED ORDER — SODIUM CHLORIDE 0.9% FLUSH: 3.0000 mL | INTRAVENOUS | Status: DC | PRN

## 2023-01-14 MED ORDER — FENTANYL CITRATE (PF) 100 MCG/2ML IJ SOLN
INTRAMUSCULAR | Status: DC | PRN
  Administered 2023-01-14: 25 ug via INTRAVENOUS

## 2023-01-14 MED ORDER — LIDOCAINE HCL (PF) 1 % IJ SOLN
INTRAMUSCULAR | Status: AC
  Filled 2023-01-14: qty 30

## 2023-01-14 MED ORDER — SODIUM CHLORIDE 0.9 % IV SOLN: 250.0000 mL | INTRAVENOUS | Status: DC | PRN

## 2023-01-14 MED ORDER — SODIUM CHLORIDE 0.9% FLUSH: 3.0000 mL | Freq: Two times a day (BID) | INTRAVENOUS | Status: DC

## 2023-01-14 MED ORDER — METFORMIN HCL ER 500 MG PO TB24: 1000.0000 mg | ORAL_TABLET | Freq: Every day | ORAL | Status: DC

## 2023-01-14 MED ORDER — MIDAZOLAM HCL 2 MG/2ML IJ SOLN
INTRAMUSCULAR | Status: DC | PRN
  Administered 2023-01-14: 2 mg via INTRAVENOUS

## 2023-01-14 MED ORDER — HEPARIN SODIUM (PORCINE) 1000 UNIT/ML IJ SOLN
INTRAMUSCULAR | Status: AC
  Filled 2023-01-14: qty 10

## 2023-01-14 MED ORDER — IOHEXOL 350 MG/ML SOLN
INTRAVENOUS | Status: DC | PRN
  Administered 2023-01-14: 65 mL

## 2023-01-14 MED ORDER — FENTANYL CITRATE (PF) 100 MCG/2ML IJ SOLN
INTRAMUSCULAR | Status: AC
  Filled 2023-01-14: qty 2

## 2023-01-14 MED ORDER — ONDANSETRON HCL 4 MG/2ML IJ SOLN: 4.0000 mg | Freq: Four times a day (QID) | INTRAMUSCULAR | Status: DC | PRN

## 2023-01-14 MED ORDER — HEPARIN (PORCINE) IN NACL 1000-0.9 UT/500ML-% IV SOLN
INTRAVENOUS | Status: DC | PRN
  Administered 2023-01-14 (×2): 500 mL

## 2023-01-14 MED ORDER — HEPARIN SODIUM (PORCINE) 1000 UNIT/ML IJ SOLN
INTRAMUSCULAR | Status: DC | PRN
  Administered 2023-01-14: 6000 [IU] via INTRAVENOUS

## 2023-01-14 MED ORDER — ASPIRIN 81 MG PO CHEW: 81.0000 mg | CHEWABLE_TABLET | ORAL | Status: DC

## 2023-01-14 MED ORDER — ACETAMINOPHEN 325 MG PO TABS: 650.0000 mg | ORAL_TABLET | ORAL | Status: DC | PRN

## 2023-01-14 MED ORDER — VERAPAMIL HCL 2.5 MG/ML IV SOLN
INTRAVENOUS | Status: DC | PRN
  Administered 2023-01-14: 10 mL via INTRA_ARTERIAL

## 2023-01-14 MED ORDER — VERAPAMIL HCL 2.5 MG/ML IV SOLN
INTRAVENOUS | Status: AC
  Filled 2023-01-14: qty 2

## 2023-01-14 MED ORDER — LIDOCAINE HCL (PF) 1 % IJ SOLN
INTRAMUSCULAR | Status: DC | PRN
  Administered 2023-01-14: 2 mL via INTRADERMAL

## 2023-01-14 SURGICAL SUPPLY — 10 items
CATH 5FR JL3.5 JR4 ANG PIG MP (CATHETERS) IMPLANT
DEVICE RAD COMP TR BAND LRG (VASCULAR PRODUCTS) IMPLANT
GLIDESHEATH SLEND SS 6F .021 (SHEATH) IMPLANT
GUIDEWIRE INQWIRE 1.5J.035X260 (WIRE) IMPLANT
INQWIRE 1.5J .035X260CM (WIRE) ×2
KIT HEART LEFT (KITS) ×1 IMPLANT
PACK CARDIAC CATHETERIZATION (CUSTOM PROCEDURE TRAY) ×1 IMPLANT
SHEATH PROBE COVER 6X72 (BAG) IMPLANT
TRANSDUCER W/STOPCOCK (MISCELLANEOUS) ×1 IMPLANT
TUBING CIL FLEX 10 FLL-RA (TUBING) ×1 IMPLANT

## 2023-01-14 NOTE — Progress Notes (Signed)
CBG 146 on freestyle libre

## 2023-01-14 NOTE — Discharge Instructions (Signed)
NO METFORMIN FOR 2 DAYS 

## 2023-01-14 NOTE — Interval H&P Note (Signed)
History and Physical Interval Note:  01/14/2023 7:30 AM  Jerry Eaton  has presented today for surgery, with the diagnosis of chest pain, abnormal ct.  The various methods of treatment have been discussed with the patient and family. After consideration of risks, benefits and other options for treatment, the patient has consented to  Procedure(s): LEFT HEART CATH AND CORONARY ANGIOGRAPHY (N/A) as a surgical intervention.  The patient's history has been reviewed, patient examined, no change in status, stable for surgery.  I have reviewed the patient's chart and labs.  Questions were answered to the patient's satisfaction.    Cath Lab Visit (complete for each Cath Lab visit)  Clinical Evaluation Leading to the Procedure:   ACS: No.  Non-ACS:    Anginal Classification: CCS II  Anti-ischemic medical therapy: Minimal Therapy (1 class of medications)  Non-Invasive Test Results: Intermediate-risk stress test findings: cardiac mortality 1-3%/year  Prior CABG: No previous CABG       Jerry Eaton [facility] 01/14/2023 7:30 AM

## 2023-01-15 MED FILL — Lidocaine HCl Local Preservative Free (PF) Inj 1%: INTRAMUSCULAR | Qty: 30 | Status: AC

## 2023-01-17 ENCOUNTER — Ambulatory Visit: Payer: BC Managed Care – PPO | Attending: Cardiovascular Disease | Admitting: Cardiovascular Disease

## 2023-01-17 ENCOUNTER — Encounter: Payer: Self-pay | Admitting: Cardiovascular Disease

## 2023-01-17 VITALS — BP 116/80 | HR 64 | Ht 72.0 in | Wt 288.2 lb

## 2023-01-17 DIAGNOSIS — Z720 Tobacco use: Secondary | ICD-10-CM

## 2023-01-17 DIAGNOSIS — E66812 Obesity, class 2: Secondary | ICD-10-CM

## 2023-01-17 DIAGNOSIS — I25118 Atherosclerotic heart disease of native coronary artery with other forms of angina pectoris: Secondary | ICD-10-CM

## 2023-01-17 DIAGNOSIS — E119 Type 2 diabetes mellitus without complications: Secondary | ICD-10-CM | POA: Diagnosis not present

## 2023-01-17 DIAGNOSIS — G4733 Obstructive sleep apnea (adult) (pediatric): Secondary | ICD-10-CM

## 2023-01-17 DIAGNOSIS — E785 Hyperlipidemia, unspecified: Secondary | ICD-10-CM | POA: Diagnosis not present

## 2023-01-17 DIAGNOSIS — Z6838 Body mass index (BMI) 38.0-38.9, adult: Secondary | ICD-10-CM

## 2023-01-17 MED ORDER — ROSUVASTATIN CALCIUM 20 MG PO TABS: 20.0000 mg | ORAL_TABLET | Freq: Every day | ORAL | 3 refills | Status: DC

## 2023-01-17 MED ORDER — EZETIMIBE 10 MG PO TABS: 10.0000 mg | ORAL_TABLET | Freq: Every day | ORAL | 3 refills | Status: DC

## 2023-01-17 MED ORDER — AMLODIPINE BESYLATE 2.5 MG PO TABS: 2.5000 mg | ORAL_TABLET | Freq: Every day | ORAL | 3 refills | Status: DC

## 2023-01-17 NOTE — Progress Notes (Signed)
Cardiology Office Note    Date:  01/22/2023   ID:  Jerry Eaton, DOB 06-01-1971, MRN [id number]  PCP:  Jerry Ghent, MD  Cardiologist:  Shelva Majestic, MD   F/U cardiology evaluation initially referred by Jerry Stain, MD for evaluation of chest pain  History of Present Illness:  Jerry Eaton is a 52 y.o. male who is followed by Dr. Damita Eaton for primary care.  I saw him for my initial evaluation on December 03, 2022.   Jerry Eaton has a history of diabetes mellitus for at least 10 years and most recently is on Farxiga and metformin.  He also is on low-dose lisinopril not for hypertension but for potential renal preservation.  He had recently traveled to [place] and had to climb over 270 steps.  He would experience some shortness of breath.  Recently, he has experienced some vague chest pressure in his neck and jaw and experienced symptoms on October 14, 2022.  He did not go to the emergency room.  He felt this discomfort did not feel like his previous GERD however his symptoms did improve with burping.  He was evaluated by Dr. Damita Eaton on November 23, 2022.  He has had several episodes of this lower sternal burning with his last episode approximately 1 week before his November 23, 2018 for evaluation.  He had stopped taking Mounjaro.  He has a history of hyperlipidemia and had been on rosuvastatin which he had stopped and is currently taking Zetia 10 mg daily. He tells me he was diagnosed with very mild sleep apnea approximately 15 years ago.  He does admit to snoring.  He believes his sleep is nonrestorative.  He cannot sleep on his back.  Epworth Sleepiness Scale score was calculated in the office today and this endorsed at an, consistent with excessive daytime sleepiness.    During his initial evaluation, I recommended that he undergo a 2D echo Doppler study to evaluate his recent symptomatology and shortness of breath.  With his chest tightness I scheduled him for coronary CTA to  assess both calcium score as well as potential luminal obstruction.  I also recommended he undergo a sleep study with a high likelihood for obstructive sleep apnea.  Coronary CTA done on December 11, 2022 showed significant increase calcium score at 716 representing 99th percentile.  There appeared to be 8 chronically occluded mid circumflex.  There was moderate plaque in the very distal LAD, proximal diagonal 1 and otherwise mild disease.  FFR analysis was 0.77 in the very distal LAD most likely due to vessel tapering and 0.69 and diagonal 1.  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  RCA had mild plaque with normal FFR.  A 2D echo Doppler study on January 01, 2023 showed a EF of 55 to 60% without wall motion abnormality with mild concentric LVH.  Apparently prior to my subsequent evaluation he had been seen by Elbert Ewings, NP after he had developed a recurrent episode of pain radiating to his jaw.  At that time, she reviewed the CTA findings with Dr. Martinique and on January 14, 2023, earlier this week had undergone definitive cardiac catheterization.  He was found to have   Past Medical History:  Diagnosis Date   Clotting disorder ([place])    DVT   Diabetes mellitus    DVT (deep venous thrombosis) ([place])    Family history of polyps in the colon    Fatty liver    GERD (  gastroesophageal reflux disease)    History of chicken pox    Hyperlipidemia    no meds now 05-28-16   Obesity    Sleep apnea    not using CPAP at this time 05-28-16    Past Surgical History:  Procedure Laterality Date   COLONOSCOPY     2012, 2017   LEFT HEART CATH AND CORONARY ANGIOGRAPHY N/A 01/14/2023   Procedure: LEFT HEART CATH AND CORONARY ANGIOGRAPHY;  Surgeon: Martinique, Peter M, MD;  Location: [facility];  Service: Cardiovascular;  Laterality: N/A;   LUMBAR DISC SURGERY     L4/L5   PILONIDAL CYST EXCISION     2001    Current Medications: Outpatient Medications Prior to Visit  Medication Sig Dispense  Refill   aspirin EC 81 MG tablet Take 1 tablet (81 mg total) by mouth daily. Swallow whole. 90 tablet 3   Continuous Blood Gluc Sensor (FREESTYLE LIBRE 3 SENSOR) MISC 1 each by Does not apply route every 14 (fourteen) days. 6 each 3   dapagliflozin propanediol (FARXIGA) 10 MG TABS tablet Take 1 tablet (10 mg total) by mouth daily before breakfast. 90 tablet 3   ibuprofen (ADVIL) 200 MG tablet Take 1-3 tablets (200-600 mg total) by mouth every 8 (eight) hours as needed (with food).     lisinopril (ZESTRIL) 2.5 MG tablet TAKE ONE TABLET BY MOUTH ONE TIME DAILY 90 tablet 0   loratadine (CLARITIN) 10 MG tablet Take 1 tablet (10 mg total) by mouth daily. 30 tablet 12   metFORMIN (GLUCOPHAGE-XR) 500 MG 24 hr tablet Take 2 tablets (1,000 mg total) by mouth daily with breakfast.     metoprolol tartrate (LOPRESSOR) 25 MG tablet Take 1 tablet (25 mg total) by mouth 2 (two) times daily. 180 tablet 3   nitroGLYCERIN (NITROSTAT) 0.4 MG SL tablet Place 1 tablet (0.4 mg total) under the tongue every 5 (five) minutes as needed for chest pain. Max 3 doses in 15 minutes. 25 tablet 3   tirzepatide (MOUNJARO) 10 MG/0.5ML Pen Inject 10 mg into the skin once a week.     ezetimibe (ZETIA) 10 MG tablet TAKE ONE TABLET BY MOUTH ONE TIME DAILY (Patient not taking: Reported on 12/25/2022) 90 tablet 0   rosuvastatin (CRESTOR) 5 MG tablet Take 1 tablet (5 mg total) by mouth daily. (Patient not taking: Reported on 12/25/2022)     Facility-Administered Medications Prior to Visit  Medication Dose Route Frequency Provider Last Rate Last Admin   sodium chloride flush (NS) 0.9 % injection 3 mL  3 mL Intravenous Q12H Trudi Ida, NP         Allergies:   Other, Chantix [varenicline tartrate], Januvia [sitagliptin phosphate], and Lipitor [atorvastatin calcium]   Social History   Socioeconomic History   Marital status: Married    Spouse name: Not on file   Number of children: 0   Years of education: Not on file   Highest  education level: Not on file  Occupational History   Occupation: Neurosurgeon  Tobacco Use   Smoking status: Every Day    Packs/day: 1.00    Years: 20.00    Additional pack years: 0.00    Total pack years: 20.00    Types: Cigarettes   Smokeless tobacco: Never   Tobacco comments:    12/03/2022-Patient smokes a pack daily  Substance and Sexual Activity   Alcohol use: Yes    Comment: very rare   Drug use: No   Sexual activity:  Yes  Other Topics Concern   Not on file  Social History Narrative   [employer] grad   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  He attended [employer].  He has a degree from  and majored in zoology.  He is married for 4-1/2 years.  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  He has a 30-year history of tobacco use, currently 1 pack/day.  He does drink occasional beer and liquor.  He does not routinely exercise.  Family History:  The patient's family history includes Colon cancer in his paternal uncle; Diabetes in his father, maternal uncle, and mother; Hyperlipidemia in his brother; Prostate cancer in his father; Stroke in his mother.  His mother is 11 and has a history of stroke, atrial fibrillation and diabetes.  Father died at age 73 and had heart disease, cancer and diabetes.  He has a brother age 22 and a sister age 31.  ROS General: Negative; No fevers, chills, or night sweats;  HEENT: Negative; No changes in vision or hearing, sinus congestion, difficulty swallowing Pulmonary: Negative; No cough, wheezing, shortness of breath, hemoptysis Cardiovascular: See HPI GI:  Negative; No nausea, vomiting, diarrhea, or abdominal pain GU: Negative; No dysuria, hematuria, or difficulty voiding Musculoskeletal: Negative; no myalgias, joint pain, or weakness Hematologic/Oncology: Negative; no easy bruising, bleeding Endocrine: Diabetes mellitus Neuro: Negative; no changes in balance, headaches Skin: Negative; No rashes or skin lesions Psychiatric: Negative; No behavioral problems, depression Sleep: Positive for snoring, daytime sleepiness, nonrestorative sleep, and difficulty sleeping on his back.negative; No bruxism, restless legs, hypnogognic hallucinations, no cataplexy   Epworth Sleepiness Scale: Situation   Chance of Dozing/Sleeping (0 = never , 1 = slight chance , 2 = moderate chance , 3 = high chance )   sitting and reading 2   watching TV 2   sitting inactive in a public place 1   being a passenger in a motor vehicle for an hour or more 3   lying down in the afternoon 1   sitting and talking to someone 0   sitting quietly after lunch (no alcohol) 1   while stopped for a few minutes in traffic as the driver 0   Total Score  10    Other comprehensive 14 point system review is negative.   PHYSICAL EXAM:   VS:  BP 116/80   Pulse 64   Ht 6' (1.829 m)   Wt 288 lb 3.2 oz (130.7 kg)   SpO2 96%   BMI 39.09 kg/m     Repeat blood pressure by me was 116/78.  Wt Readings from Last 3 Encounters:  01/17/23 288 lb 3.2 oz (130.7 kg)  01/14/23 292 lb (132.5 kg)  12/25/22 292 lb (132.5 kg)    General: Alert, oriented, no distress.  Obese Skin: normal turgor, no rashes, warm and dry HEENT: Normocephalic, atraumatic. Pupils equal round and reactive to light; sclera anicteric; extraocular muscles intact;  Nose without nasal septal hypertrophy Mouth/Parynx: Large tongue; Mallinpatti scale 3 Neck: No JVD, no carotid bruits; normal carotid upstroke Lungs: clear to ausculatation and percussion;  no wheezing or rales Chest wall: without tenderness to  palpitation Heart: PMI not displaced, RRR, s1 s2 normal, 1/6 systolic murmur, no diastolic murmur, no rubs, gallops, thrills, or heaves Abdomen: Central adiposity; soft, nontender; no hepatosplenomehaly, BS+; abdominal aorta nontender and not dilated by palpation. Back: no CVA tenderness Pulses 2+ Musculoskeletal: full range of motion, normal strength, no joint deformities Extremities: no clubbing cyanosis or edema, Homan's sign negative  Neurologic: grossly nonfocal; Cranial nerves grossly wnl Psychologic: Normal mood and affect   Studies/Labs Reviewed:   January 17, 2023 ECG (independently read by me): NSR at 64, Q III, aVF, no ectopy  December 03, 2022 ECG (independently read by me): Sinus rhythm at 90, PAC, Left axis deviation, Inferior Q wave III, aVF; QTc 455 msec  Recent Labs:    Latest Ref Rng & Units 01/08/2023   10:26 AM 12/10/2022   10:20 AM 12/04/2022    8:22 AM  BMP  Glucose 70 - 99 mg/dL 181  264  172   BUN 6 - 24 mg/dL 17  20  19    Creatinine 0.76 - 1.27 mg/dL 0.85  0.83  0.83   BUN/Creat Ratio 9 - 20 20   23    Sodium 134 - 144 mmol/L 142  135  140   Potassium 3.5 - 5.2 mmol/L 5.2  4.6  4.6   Chloride 96 - 106 mmol/L 104  103  101   CO2 20 - 29 mmol/L 22  23  22    Calcium 8.7 - 10.2 mg/dL 9.7  9.2  9.3         Latest Ref Rng & Units 11/23/2022    9:58 AM 06/15/2022    3:04 PM 06/06/2021    8:32 AM  Hepatic Function  Total Protein 6.0 - 8.3 g/dL 7.1  6.8  8.0   Albumin 3.5 - 5.2 g/dL 4.4   4.6   AST 0 - 37 U/L 16  17  18    ALT 0 - 53 U/L 21  23  22    Alk Phosphatase 39 - 117 U/L 59   70   Total Bilirubin 0.2 - 1.2 mg/dL 0.7  0.5  0.4        Latest Ref Rng & Units 01/08/2023   10:26 AM 12/10/2022   10:20 AM 11/23/2022    9:58 AM  CBC  WBC 3.4 - 10.8 x10E3/uL 6.3  6.8  5.8   Hemoglobin 13.0 - 17.7 g/dL 15.2  14.6  15.1   Hematocrit 37.5 - 51.0 % 45.6  44.3  44.6   Platelets 150 - 450 x10E3/uL 293  303  278.0    Lab Results  Component Value Date   MCV 85  01/08/2023   MCV 85.9 12/10/2022   MCV 85.9 11/23/2022   Lab Results  Component Value Date   TSH 0.88 11/23/2022   Lab Results  Component Value Date   HGBA1C 6.5 11/23/2022     BNP No results found for: "BNP"  ProBNP No results found for: "PROBNP"   Lipid Panel     Component Value Date/Time   CHOL 215 (H) 12/04/2022 0822   TRIG 131 12/04/2022 0822   HDL 40 12/04/2022 0822   CHOLHDL 5.4 (H) 12/04/2022 0822   CHOLHDL 4.5 06/15/2022 1504   VLDL 22.8 10/06/2021 0820   LDLCALC 151 (H) 12/04/2022 0822   LDLCALC 90 06/15/2022 1504   LDLDIRECT 163.5 05/16/2012 0812   LABVLDL 24 12/04/2022 0822     RADIOLOGY: CARDIAC CATHETERIZATION  Result Date: 01/14/2023   1st Diag lesion is 70% stenosed.   Prox LAD to Dist LAD lesion is 20% stenosed.   Ramus lesion is 25% stenosed with 25% stenosed side branch in Lat Ramus.   Prox Cx to Dist Cx lesion is 95% stenosed.   Prox RCA to Mid RCA lesion is 30% stenosed.   The left ventricular systolic function is normal.   LV end diastolic pressure is normal.   The left ventricular ejection fraction is 55-65% by visual estimate. 2 vessel obstructive CAD. There is borderline obstructive disease in the first diagonal which is tortuous proximally. The LCx is a small caliber vessel with diffuse severe obstruction Normal LV function Normal LV EDP Plan: recommend aggressive risk factor modification and antianginal therapy. The LCx is poorly suited for PCI given small diameter and diffuse disease.   ECHOCARDIOGRAM COMPLETE  Result Date: 01/01/2023    ECHOCARDIOGRAM REPORT   Patient Name:   KAIYA MCGREGOR Date of Exam: 01/01/2023 Medical Rec #:  [id number]       Height:       72.0 in Accession #:    [id number]      Weight:       292.0 lb Date of Birth:  1971-02-05      BSA:          2.502 m Patient Age:    38 years        BP:           120/82 mmHg Patient Gender: M               HR:           70 bpm. Exam Location:  [facility] Procedure: 2D Echo, Cardiac  Doppler and Color Doppler Indications:    R94.31 Abnormal EKG  History:        Patient has no prior history of Echocardiogram examinations.                 Abnormal ECG; Risk Factors:Diabetes, Dyslipidemia, Obesity and                 Current Smoker.  Sonographer:    Coralyn Helling RDCS Referring Phys: [place] Comments: Technically difficult study due to poor echo windows and patient is obese. Image acquisition challenging due to patient body habitus. IMPRESSIONS  1. Left ventricular ejection fraction, by estimation, is 55 to 60%. The left ventricle has normal function. The left ventricle has no regional wall motion abnormalities. There is mild concentric left ventricular hypertrophy. Left ventricular diastolic parameters were normal.  2. Right ventricular systolic function is normal. The right ventricular size is normal.  3. The mitral valve is normal in structure. No evidence of mitral valve regurgitation. No evidence of mitral stenosis.  4. The aortic valve is tricuspid. Aortic valve regurgitation is not visualized. No aortic stenosis is present.  5. Aortic dilatation noted. There is mild dilatation of the aortic root, measuring 37 mm.  6. The inferior vena cava is normal in size with greater than 50% respiratory variability, suggesting right atrial pressure of 3 mmHg. FINDINGS  Left Ventricle: Left ventricular ejection fraction, by estimation, is 55 to 60%. The left ventricle has normal function. The left ventricle has no regional wall motion abnormalities. Definity contrast agent was given IV to delineate the left ventricular  endocardial borders. The left ventricular internal cavity size was normal in size. There is mild concentric left ventricular hypertrophy. Left ventricular diastolic  parameters were normal. Indeterminate filling pressures. Right Ventricle: The right ventricular size is normal. No increase in right ventricular wall thickness. Right ventricular systolic function is  normal. Left Atrium: Left atrial size was normal in size. Right Atrium: Right atrial size was normal in size. Pericardium: There is no evidence of pericardial effusion. Mitral Valve: The mitral valve is normal in structure. No evidence of mitral valve regurgitation. No evidence of mitral valve stenosis. Tricuspid Valve: The tricuspid valve is normal in structure. Tricuspid valve regurgitation is not demonstrated. No evidence of tricuspid stenosis. Aortic Valve: The aortic valve is tricuspid. Aortic valve regurgitation is not visualized. No aortic stenosis is present. Pulmonic Valve: The pulmonic valve was normal in structure. Pulmonic valve regurgitation is trivial. No evidence of pulmonic stenosis. Aorta: Aortic dilatation noted. There is mild dilatation of the aortic root, measuring 37 mm. Venous: The inferior vena cava is normal in size with greater than 50% respiratory variability, suggesting right atrial pressure of 3 mmHg. IAS/Shunts: No atrial level shunt detected by color flow Doppler.  LEFT VENTRICLE PLAX 2D LVIDd:         5.70 cm   Diastology LVIDs:         3.90 cm   LV e' medial:    9.03 cm/s LV PW:         1.20 cm   LV E/e' medial:  10.9 LV IVS:        1.20 cm   LV e' lateral:   13.50 cm/s LVOT diam:     2.60 cm   LV E/e' lateral: 7.3 LV SV:         109 LV SV Index:   44 LVOT Area:     5.31 cm  RIGHT VENTRICLE             IVC RV S prime:     10.20 cm/s  IVC diam: 1.20 cm TAPSE (M-mode): 2.1 cm LEFT ATRIUM             Index        RIGHT ATRIUM           Index LA diam:        4.10 cm 1.64 cm/m   RA Pressure: 3.00 mmHg LA Vol (A2C):   64.8 ml 25.90 ml/m  RA Area:     14.90 cm LA Vol (A4C):   55.9 ml 22.34 ml/m  RA Volume:   36.30 ml  14.51 ml/m LA Biplane Vol: 60.6 ml 24.22 ml/m  AORTIC VALVE LVOT Vmax:   91.80 cm/s LVOT Vmean:  59.500 cm/s LVOT VTI:    0.206 m  AORTA Ao Root diam: 3.70 cm Ao Asc diam:  3.50 cm MITRAL VALVE               TRICUSPID VALVE MV Area (PHT): 4.17 cm    Estimated RAP:  3.00  mmHg MV Decel Time: 182 msec MV E velocity: 98.50 cm/s  SHUNTS MV A velocity: 61.80 cm/s  Systemic VTI:  0.21 m MV E/A ratio:  1.59        Systemic Diam: 2.60 cm Skeet Latch MD Electronically signed by Skeet Latch MD Signature Date/Time: 01/01/2023/3:02:18 PM    Final      Additional studies/ records that were reviewed today include:  I reviewed the records of Dr. Elsie Eaton.  CARDIAC CATH: 01/14/2023   1st Diag lesion is 70% stenosed.   Prox LAD to Dist LAD lesion is 20% stenosed.   Ramus lesion  is 25% stenosed with 25% stenosed side branch in Lat Ramus.   Prox Cx to Dist Cx lesion is 95% stenosed.   Prox RCA to Mid RCA lesion is 30% stenosed.   The left ventricular systolic function is normal.   LV end diastolic pressure is normal.   The left ventricular ejection fraction is 55-65% by visual estimate.   2 vessel obstructive CAD. There is borderline obstructive disease in the first diagonal which is tortuous proximally. The LCx is a small caliber vessel with diffuse severe obstruction Normal LV function Normal LV EDP   Plan: recommend aggressive risk factor modification and antianginal therapy. The LCx is poorly suited for PCI given small diameter and diffuse disease.        ASSESSMENT:    1. Coronary artery disease of native artery of native heart with stable angina pectoris ([place])   2. Evaluate for OSA (obstructive sleep apnea)   3. Type 2 diabetes mellitus without complication, without long-term current use of insulin ([place])   4. Hyperlipidemia with target LDL less than 55   5. Class 2 severe obesity due to excess calories with serious comorbidity and body mass index (BMI) of 38.0 to 38.9 in adult ([place])   6. Tobacco use     PLAN:  Mr. Briden "Randall Hiss" Shamel is a 52 year old gentleman who has a 10-year history of diabetes mellitus, 30-year history of tobacco use, history of GERD, and recently has experienced episodes of lower chest discomfort radiating to his back and  also experiencing some lower jaw discomfort.  His symptoms do improve somewhat with burping.  He has been on Farxiga 10 mg and metformin 1000 mg twice a day for hyperlipidemia.  Remotely, he had been on rosuvastatin 5 mg but stopped therapy.  Laboratory in June 15, 2022 showed total cholesterol 158, HDL 35, triglycerides were significantly elevated at 237 and LDL cholesterol was 90.  I suspect he most likely had increased LDL particle number with probable small LDL particles if NMR LipoProfile analysis was performed.  He had traveled recently to [place] and did hike and climb a significant number of steps to his temple.  He had recently developed chest pain.  An echo Doppler study revealed normal systolic function with EF 55 to 60% with mild LVH.  There was very mild dilation of aortic root at 37 mm.  Coronary CTA revealed an elevated calcium score at 716 representing 99th percentile.  He apparently had undergone cardiac catheterization on Monday of this week by Dr. Martinique.  I reviewed the catheterization angiograms.  Agree with recommendation for medical therapy with aggressive risk factor modification.  His blood pressure today is stable at 116/80.  Currently he is on Farxiga 10 mg and metformin for diabetes.  He has been on lisinopril 2.5 mg and metoprolol tartrate 25 mg twice a day for hypertension and his CAD.  I am adding initially amlodipine at 2.5 mg daily and depending upon blood pressure this can be titrated to 5 mg.  I have recommended he increase rosuvastatin to 20 mg and restart Zetia with LDL goal less than 55.  I strongly encouraged complete discontinuance of tobacco.  At my initial visit, I was suspect for obstructive sleep apnea.  He is scheduled to undergo a home sleep study on January 30, 2023.  Repeat laboratory will be obtained in several months and I will plan to see him in May for follow-up evaluation.    Medication Adjustments/Labs and Tests Ordered: Current medicines are reviewed at  length with the patient today.  Concerns regarding medicines are outlined above.  Medication changes, Labs and Tests ordered today are listed in the Patient Instructions below. Patient Instructions  Medication Instructions:   Resume taking Zetia  10 mg  daily -- Rosuvastatin 20 mg -- take a total of 4 tablets to = 20 mg )  A new prescription has been sent to pharmacy    Start taking Amlodipine 2.5 mg one tablet daily    *If you need a refill on your cardiac medications before your next appointment, please call your pharmacy*   Lab Work:  Not needed   Testing/Procedures:  Not needed  Follow-Up: At [facility], you and your health needs are our priority.  As part of our continuing mission to provide you with exceptional heart care, we have created designated Provider Care Teams.  These Care Teams include your primary Cardiologist (physician) and Advanced Practice Providers (APPs -  Physician Assistants and Nurse Practitioners) who all work together to provide you with the care you need, when you need it.     Your next appointment:    2 to 3 month(s)  The format for your next appointment:   In Person  Provider:   Shelva Majestic, MD    Other Instructions    Keep appointment for home sleep atest  Your physician discussed the hazards of tobacco use. Tobacco use cessation is recommended and techniques and options to help you quit were discussed.   Keep appointment with [facility]  pharmacist    Signed, Shelva Majestic, MD  01/22/2023 6:14 PM    [facility] [address] [place], [place], [place]  [address] Phone: [phone]

## 2023-01-17 NOTE — Patient Instructions (Signed)
Medication Instructions:   Resume taking Zetia  10 mg  daily -- Rosuvastatin 20 mg -- take a total of 4 tablets to = 20 mg )  A new prescription has been sent to pharmacy    Start taking Amlodipine 2.5 mg one tablet daily    *If you need a refill on your cardiac medications before your next appointment, please call your pharmacy*   Lab Work:  Not needed   Testing/Procedures:  Not needed  Follow-Up: At [facility], you and your health needs are our priority.  As part of our continuing mission to provide you with exceptional heart care, we have created designated Provider Care Teams.  These Care Teams include your primary Cardiologist (physician) and Advanced Practice Providers (APPs -  Physician Assistants and Nurse Practitioners) who all work together to provide you with the care you need, when you need it.     Your next appointment:    2 to 3 month(s)  The format for your next appointment:   In Person  Provider:   Shelva Majestic, MD    Other Instructions    Keep appointment for home sleep atest  Your physician discussed the hazards of tobacco use. Tobacco use cessation is recommended and techniques and options to help you quit were discussed.   Keep appointment with [facility]  pharmacist

## 2023-01-22 ENCOUNTER — Encounter: Payer: Self-pay | Admitting: Cardiovascular Disease

## 2023-01-28 ENCOUNTER — Ambulatory Visit: Payer: BC Managed Care – PPO | Admitting: Nurse Practitioner

## 2023-01-30 ENCOUNTER — Ambulatory Visit (HOSPITAL_BASED_OUTPATIENT_CLINIC_OR_DEPARTMENT_OTHER): Payer: BC Managed Care – PPO | Attending: Cardiovascular Disease | Admitting: Cardiovascular Disease

## 2023-01-30 ENCOUNTER — Encounter: Payer: Self-pay | Admitting: Pharmacist Clinician (PhC)/ Clinical Pharmacy Specialist

## 2023-01-30 ENCOUNTER — Ambulatory Visit
Payer: BC Managed Care – PPO | Attending: Cardiology | Admitting: Pharmacist Clinician (PhC)/ Clinical Pharmacy Specialist

## 2023-01-30 ENCOUNTER — Telehealth: Payer: Self-pay | Admitting: Pharmacist Clinician (PhC)/ Clinical Pharmacy Specialist

## 2023-01-30 ENCOUNTER — Other Ambulatory Visit (HOSPITAL_COMMUNITY): Payer: Self-pay

## 2023-01-30 VITALS — Ht 72.0 in | Wt 290.0 lb

## 2023-01-30 DIAGNOSIS — E785 Hyperlipidemia, unspecified: Secondary | ICD-10-CM

## 2023-01-30 DIAGNOSIS — G4733 Obstructive sleep apnea (adult) (pediatric): Secondary | ICD-10-CM | POA: Insufficient documentation

## 2023-01-30 MED ORDER — REPATHA SURECLICK 140 MG/ML ~~LOC~~ SOAJ: 140.0000 mg | SUBCUTANEOUS | 12 refills | Status: DC

## 2023-01-30 NOTE — Progress Notes (Signed)
Office Visit    Patient Name: Jerry Eaton Date of Encounter: 01/30/2023  Primary Care Provider:  Joaquim Nam, MD Primary Cardiologist:  Nicki Guadalajara, MD  Chief Complaint    Hyperlipidemia   Significant Past Medical History   CAD CAC = 716 (99th percentile); cath - 95% prox-distal CX lesion, 70% 1st Diag lesion  DM2 2/24 A1c 6.4 on Farxiga 10, metformin 1 gm bid, Mounjaro  OSA Sleep study pending  VTE DVT 06/2019 - provoked by long car ride        Allergies  Allergen Reactions   Other Anaphylaxis    Bee stings   Chantix [Varenicline Tartrate]     irritable   Januvia [Sitagliptin Phosphate]     Inc in LFTs. Occurred with janumet, but prev tolerated metformin alone   Lipitor [Atorvastatin Calcium]     myalgia    History of Present Illness    Jerry Eaton is a 52 y.o. male patient of Dr Tresa Endo, in the office today to discuss options for cholesterol lowering.  He had previously failed atorvastatin (myalgias), but is currently doing well on rosuvastatin 20 mg, as well as ezetimibe 10 mg.  Unfortunately LDL still elevated at 151.    Insurance Carrier:  [employer]  LDL Cholesterol goal:  LDL < 55  Current Medications: rosuvastatin 20 mg qd, ezetimibe 10 mg qd    Previously tried: atorvastatin - myalgias  Family Hx:  father deceased (diabetes, prostate cancer); mother living (diabetes, stroke); brother (hyperlipidemia)  Social Hx: Tobacco: drastically reduced, 1 pack lasts 3-4 days Alcohol: few times per month, occasionally 3-4 but mostly not  Diet:   more home cooked meals, some vegetables, more potatoes; no pork but other proteins; not snacking much  Exercise: no regular exercise;   Adherence Assessment  Do you ever forget to take your medication? [x] Yes - trouble remembering PM medications [] No  Do you ever skip doses due to side effects? [] Yes [x] No  Do you have trouble affording your medicines? [] Yes [x] No  Are you ever unable to  pick up your medication due to transportation difficulties? [] Yes [x] No  Do you ever stop taking your medications because you don't believe they are helping? [] Yes [x] No    Accessory Clinical Findings   Lab Results  Component Value Date   CHOL 215 (H) 12/04/2022   HDL 40 12/04/2022   LDLCALC 151 (H) 12/04/2022   LDLDIRECT 163.5 05/16/2012   TRIG 131 12/04/2022   CHOLHDL 5.4 (H) 12/04/2022    Lab Results  Component Value Date   ALT 21 11/23/2022   AST 16 11/23/2022   ALKPHOS 59 11/23/2022   BILITOT 0.7 11/23/2022   Lab Results  Component Value Date   CREATININE 0.85 01/08/2023   BUN 17 01/08/2023   NA 142 01/08/2023   K 5.2 01/08/2023   CL 104 01/08/2023   CO2 22 01/08/2023   Lab Results  Component Value Date   HGBA1C 6.5 11/23/2022    Home Medications    Current Outpatient Medications  Medication Sig Dispense Refill   amLODipine (NORVASC) 2.5 MG tablet Take 1 tablet (2.5 mg total) by mouth daily. 90 tablet 3   aspirin EC 81 MG tablet Take 1 tablet (81 mg total) by mouth daily. Swallow whole. 90 tablet 3   Continuous Blood Gluc Sensor (FREESTYLE LIBRE 3 SENSOR) MISC 1 each by Does not apply route every 14 (fourteen) days. 6 each 3   dapagliflozin propanediol (FARXIGA) 10 MG TABS tablet  Take 1 tablet (10 mg total) by mouth daily before breakfast. 90 tablet 3   ezetimibe (ZETIA) 10 MG tablet Take 1 tablet (10 mg total) by mouth daily. 90 tablet 3   ibuprofen (ADVIL) 200 MG tablet Take 1-3 tablets (200-600 mg total) by mouth every 8 (eight) hours as needed (with food).     lisinopril (ZESTRIL) 2.5 MG tablet TAKE ONE TABLET BY MOUTH ONE TIME DAILY 90 tablet 0   loratadine (CLARITIN) 10 MG tablet Take 1 tablet (10 mg total) by mouth daily. 30 tablet 12   metFORMIN (GLUCOPHAGE-XR) 500 MG 24 hr tablet Take 2 tablets (1,000 mg total) by mouth daily with breakfast.     metoprolol tartrate (LOPRESSOR) 25 MG tablet Take 1 tablet (25 mg total) by mouth 2 (two) times daily. 180  tablet 3   nitroGLYCERIN (NITROSTAT) 0.4 MG SL tablet Place 1 tablet (0.4 mg total) under the tongue every 5 (five) minutes as needed for chest pain. Max 3 doses in 15 minutes. 25 tablet 3   rosuvastatin (CRESTOR) 20 MG tablet Take 1 tablet (20 mg total) by mouth at bedtime. 90 tablet 3   tirzepatide (MOUNJARO) 10 MG/0.5ML Pen Inject 10 mg into the skin once a week.     Current Facility-Administered Medications  Medication Dose Route Frequency Provider Last Rate Last Admin   sodium chloride flush (NS) 0.9 % injection 3 mL  3 mL Intravenous Q12H Flossie Dibble, NP         Assessment & Plan    HLD (hyperlipidemia) Assessment: Patient with ASCVDnot at LDL goal of < 55 Most recent LDL 151 on 12/15/22 Has been compliant with high intensity statin/ezetimibe : rosuvastatin 20 mg, ezetimibe 10 mg  Reviewed options for lowering LDL cholesterol, specifically PCSK-9 inhibitors.  Discussed mechanisms of action, dosing, side effects, potential decreases in LDL cholesterol and costs.  Also reviewed potential options for patient assistance.  Plan: Patient agreeable to starting Repatha 140 mg q14d Repeat labs after:  3 months Lipid Liver function Patient was given co-pay card in office, will call to activate once approved   Phillips Hay, PharmD CPP [facility] [address]  [place], [place] [address] [phone]  01/30/2023, 9:10 AM

## 2023-01-30 NOTE — Patient Instructions (Signed)
Your Results:             Your most recent labs Goal  Total Cholesterol 215 < 200  Triglycerides 131 < 150  HDL (happy/good cholesterol) 40 > 40  LDL (lousy/bad cholesterol 151 < 55   Medication changes:  We will start the process to get Repatha covered by your insurance.  Once the prior authorization is complete, I will call/send a MyChart message to let you know and confirm pharmacy information.   You will take one injection every 14 days.   Lab orders:  We want to repeat labs after 2-3 months.  We will send you a lab order to remind you once we get closer to that time.    Please reach out with any questions or concerns.   Thank you for choosing [facility]

## 2023-01-30 NOTE — Telephone Encounter (Signed)
Please start PA for Repatha 140 mg q14d  No PA required - 34 day supply max allowed

## 2023-01-30 NOTE — Telephone Encounter (Signed)
I don't get to say this very often on new starts. No PA is required. :)

## 2023-01-30 NOTE — Assessment & Plan Note (Signed)
Assessment: Patient with ASCVDnot at LDL goal of < 55 Most recent LDL 151 on 12/15/22 Has been compliant with high intensity statin/ezetimibe : rosuvastatin 20 mg, ezetimibe 10 mg  Reviewed options for lowering LDL cholesterol, specifically PCSK-9 inhibitors.  Discussed mechanisms of action, dosing, side effects, potential decreases in LDL cholesterol and costs.  Also reviewed potential options for patient assistance.  Plan: Patient agreeable to starting Repatha 140 mg q14d Repeat labs after:  3 months Lipid Liver function Patient was given co-pay card in office, will call to activate once approved

## 2023-02-04 ENCOUNTER — Ambulatory Visit: Payer: BC Managed Care – PPO

## 2023-02-04 ENCOUNTER — Ambulatory Visit: Payer: BC Managed Care – PPO | Admitting: Nurse Practitioner

## 2023-02-07 ENCOUNTER — Encounter: Payer: Self-pay | Admitting: Family Medicine

## 2023-02-07 ENCOUNTER — Ambulatory Visit (INDEPENDENT_AMBULATORY_CARE_PROVIDER_SITE_OTHER): Payer: BC Managed Care – PPO | Admitting: Family Medicine

## 2023-02-07 VITALS — BP 110/70 | HR 78 | Temp 98.4°F | Ht 72.0 in | Wt 279.0 lb

## 2023-02-07 DIAGNOSIS — M722 Plantar fascial fibromatosis: Secondary | ICD-10-CM

## 2023-02-07 DIAGNOSIS — L299 Pruritus, unspecified: Secondary | ICD-10-CM

## 2023-02-07 MED ORDER — FAMOTIDINE 20 MG PO TABS: 20.0000 mg | ORAL_TABLET | Freq: Two times a day (BID) | ORAL | Status: DC

## 2023-02-07 NOTE — Patient Instructions (Addendum)
Add on famotidine twice a day.  Keep taking claritin.   See if that helps the itching.   If the itching and throat symptoms are not better, then let me know and we can set you up with ENT.    Take care.  Glad to see you.

## 2023-02-07 NOTE — Progress Notes (Signed)
Discussed cath results.  No cigarettes in 3 days.  I thanked him for his effort.    Itching and dermatographism.  Taking claritin.  No lip or tongue swelling.    Sore throat since mid March, episodically.  Sneezing, ear popping. Fluttering in the L ear.  No vertigo.  Pain with swallowing. Minimal heartburn with initial mounjaro dose but that resolved in the meantime.    Return of L>>R plantar fasciitis- unclear if med related.  Unclear if this is related to [facility] vs Crestor.  He is off farxiga in the meantime, to see if that would help.    Meds, vitals, and allergies reviewed.   ROS: Per HPI unless specifically indicated in ROS section   GEN: nad, alert and oriented HEENT: mucous membranes moist, OP without erythema or acute changes.  No ulceration. TM without erythema. NECK: supple w/o LA CV: rrr.  PULM: ctab, no inc wob ABD: soft, +bs EXT: no edema SKIN: He has dermatographia some demonstrated at the office visit on his arm, when pressure is applied.  This involves/improves as the office visit goes on.  30 minutes were devoted to patient care in this encounter (this includes time spent reviewing the patient's file/history, interviewing and examining the patient, counseling/reviewing plan with patient).

## 2023-02-10 ENCOUNTER — Telehealth: Payer: Self-pay | Admitting: Family Medicine

## 2023-02-10 DIAGNOSIS — R070 Pain in throat: Secondary | ICD-10-CM

## 2023-02-10 DIAGNOSIS — L299 Pruritus, unspecified: Secondary | ICD-10-CM | POA: Insufficient documentation

## 2023-02-10 NOTE — Assessment & Plan Note (Addendum)
I am checking on options about this.  See following phone note.

## 2023-02-10 NOTE — Telephone Encounter (Signed)
How often do you see flares of plantar fasciitis with either Mounjaro or Crestor?  The patient did not know if his symptom was exacerbated by either medication.  I did not change the medication yet and I wanted your input.  Thanks.

## 2023-02-10 NOTE — Assessment & Plan Note (Signed)
With dermatographia some demonstrated at the office visit.  Discussed options.   Add on famotidine twice a day.  Keep taking claritin.  No lip or tongue swelling.  Okay for outpatient follow-up.  He will see if the antihistamine combination helps with itching. If the itching and throat symptoms are not better, then he can let me know and we can set him up with ENT.

## 2023-02-17 ENCOUNTER — Encounter (HOSPITAL_BASED_OUTPATIENT_CLINIC_OR_DEPARTMENT_OTHER): Payer: Self-pay | Admitting: Cardiovascular Disease

## 2023-02-17 NOTE — Telephone Encounter (Signed)
Please update patient.  Dr. Elvera Lennox hadn't seen cases of plantar fasciitis related to [facility] or Crestor.  I need input from cardiology- please have patient ask Dr. Tresa Endo for his input.  I routed this to Dr. Tresa Endo in the meantime.  Thanks.

## 2023-02-17 NOTE — Procedures (Signed)
     Patient Name: Jerry Eaton, Jerry Eaton Date: 01/30/2023 Gender: Male D.O.B: 12-24-1970 Age (years): 53 Referring Provider: Nicki Guadalajara MD, ABSM Height (inches): 72 Interpreting Physician: Nicki Guadalajara MD, ABSM Weight (lbs): 290 RPSGT: Berkey Sink BMI: 39 MRN: [id number] Neck Size: 17.50  CLINICAL INFORMATION Sleep Study Type: HST  Indication for sleep study: Snoring, daytime sleepiness, non-restorative sleep, cannot sleep on back.  Epworth Sleepiness Score: 11  SLEEP STUDY TECHNIQUE A multi-channel overnight portable sleep study was performed. The channels recorded were: nasal airflow, thoracic respiratory movement, and oxygen saturation with a pulse oximetry. Snoring was also monitored.  MEDICATIONS amLODipine (NORVASC) 2.5 MG tablet aspirin EC 81 MG tablet Continuous Blood Gluc Sensor (FREESTYLE LIBRE 3 SENSOR) MISC Evolocumab (REPATHA SURECLICK) 140 MG/ML SOAJ ezetimibe (ZETIA) 10 MG tablet famotidine (PEPCID) 20 MG tablet ibuprofen (ADVIL) 200 MG tablet lisinopril (ZESTRIL) 2.5 MG tablet loratadine (CLARITIN) 10 MG tablet metFORMIN (GLUCOPHAGE-XR) 500 MG 24 hr tablet metoprolol tartrate (LOPRESSOR) 25 MG tablet nitroGLYCERIN (NITROSTAT) 0.4 MG SL tablet rosuvastatin (CRESTOR) 20 MG tablet tirzepatide (MOUNJARO) 10 MG/0.5ML Pen Patient self administered medications include: N/A.  SLEEP ARCHITECTURE Patient was studied for 364.5 minutes. The sleep efficiency was 100.0 % and the patient was supine for 0%. The arousal index was 0.0 per hour.  RESPIRATORY PARAMETERS The overall AHI was 8.2 per hour, with a central apnea index of 0 per hour. Supine sleep was absent.  The oxygen nadir was 86% during sleep.  CARDIAC DATA Mean heart rate during sleep was 70.7 bpm. Heart rate range: 54 - 95 bpm.  IMPRESSIONS - Mild obstructive sleep apnea occurred during this study (AHI 8.2/h) with absent supine sleep. Suspect the overall severity of sleep apnea may be  underestimated if supine sleep was present. - Moderate oxygen desaturation was noted during this study to a nadir of 86%. - Patient snored for 54.3 minutes (14.9%) during the sleep.  DIAGNOSIS - Obstructive Sleep Apnea (G47.33)  RECOMMENDATIONS - Therapeutic CPAP titration to determine optimal pressure required to alleviate sleep disordered breathing. In this patient with significant cardiovascular abnormalites recommend initiation of CPAP Auto therapy with EPR of 3 at 7 - 18 cm of water.  - Effort should be made to optimize nasal and oropharyngeal patency. - A customized oral appliance may be considered; however, with patient's symptoms of inabilty to sleep on back, sleep apnea may be more significant with supine sleep.  - Avoid alcohol, sedatives and other CNS depressants that may worsen sleep apnea and disrupt normal sleep architecture. - Sleep hygiene should be reviewed to assess factors that may improve sleep quality. - Weight management and regular exercise should be initiated or continued. - Recommend a download and sleep clinic evaluation after 4 - 6 weeks of therapy.   [Electronically signed] 02/17/2023 01:30 PM  Nicki Guadalajara MD, [facility], ABSM Diplomate, American Board of Sleep Medicine  NPI: [id number]  [facility] PH: [phone]   FX: [phone] ACCREDITED BY THE AMERICAN ACADEMY OF SLEEP MEDICINE

## 2023-02-18 MED ORDER — OMEPRAZOLE 20 MG PO CPDR: 20.0000 mg | DELAYED_RELEASE_CAPSULE | Freq: Every day | ORAL | Status: DC

## 2023-02-18 NOTE — Telephone Encounter (Signed)
I put in the referral to [facility] ENT.  Let us know if he doesn't get a call about that.  Would still continue famotidine for now. If needed, he can change famotidine to over-the-counter omeprazole 20 mg a day.  Thanks.

## 2023-02-18 NOTE — Telephone Encounter (Signed)
Called and updated patient, he states the pain has improved some but not all the way. He will await input from cardiology.  Patient states he has been taking the famotidine as prescribed and his throat pain has improved but not completely resolved. At time he states it flares up. He is wanting to see if you recommend a referral to ENT.

## 2023-02-18 NOTE — Addendum Note (Signed)
Addended by: Joaquim Nam on: 02/18/2023 11:07 PM   Modules accepted: Orders

## 2023-02-19 ENCOUNTER — Encounter: Payer: Self-pay | Admitting: Internal Medicine

## 2023-02-19 ENCOUNTER — Ambulatory Visit (INDEPENDENT_AMBULATORY_CARE_PROVIDER_SITE_OTHER): Payer: BC Managed Care – PPO | Admitting: Internal Medicine

## 2023-02-19 VITALS — BP 106/60 | HR 83 | Wt 282.0 lb

## 2023-02-19 DIAGNOSIS — E1140 Type 2 diabetes mellitus with diabetic neuropathy, unspecified: Secondary | ICD-10-CM | POA: Diagnosis not present

## 2023-02-19 DIAGNOSIS — Z794 Long term (current) use of insulin: Secondary | ICD-10-CM | POA: Diagnosis not present

## 2023-02-19 DIAGNOSIS — E669 Obesity, unspecified: Secondary | ICD-10-CM | POA: Diagnosis not present

## 2023-02-19 DIAGNOSIS — E785 Hyperlipidemia, unspecified: Secondary | ICD-10-CM

## 2023-02-19 LAB — POCT GLYCOSYLATED HEMOGLOBIN (HGB A1C): Hemoglobin A1C: 7.3 % — AB (ref 4.0–5.6)

## 2023-02-19 LAB — MICROALBUMIN / CREATININE URINE RATIO
Creatinine,U: 253.8 mg/dL
Microalb Creat Ratio: 1.5 mg/g (ref 0.0–30.0)
Microalb, Ur: 3.8 mg/dL — ABNORMAL HIGH (ref 0.0–1.9)

## 2023-02-19 NOTE — Progress Notes (Addendum)
Patient ID: Jerry Eaton, male   DOB: 17-Nov-1970, 52 y.o.   MRN: [id number]   HPI: Jerry Eaton is a 52 y.o.-year-old male, returning for follow-up for DM2, dx in ~2013, insulin-dependent since 2016-2017,  now off insulin, uncontrolled, with complications (DR, PN). Last visit 6 months ago.  Interim history: No increased urination, blurry vision, nausea, chest pain.  He has increased muscle cramps and plantar fasciitis.  To find a possible culprit, he has been off [place], then [place], now back on [place].  His Crestor dose was recently increased. Since last visit, he had COVID-19 in 10/2022.  No lingering symptoms. In 11/2022, he was in the emergency room with palpitations-found to have ventricular bigeminy. He has jaw pain, sore throat. He stopped smoking 2 weeks ago.  Reviewed HbA1c levels: Lab Results  Component Value Date   HGBA1C 6.5 11/23/2022   HGBA1C 6.4 (A) 08/17/2022   HGBA1C 6.6 (A) 02/09/2022   HGBA1C 6.6 (A) 10/06/2021   HGBA1C 7.5 (A) 06/06/2021   HGBA1C 6.9 (A) 01/20/2021   HGBA1C 6.9 (A) 08/29/2020   HGBA1C 6.7 (A) 04/28/2020   HGBA1C 6.2 (A) 08/21/2019   HGBA1C 6.5 (A) 03/26/2019   HGBA1C 7.2 (A) 11/24/2018   HGBA1C 7.2 (A) 07/14/2018   HGBA1C 7.7 (H) 03/06/2018   HGBA1C 7.8 11/25/2017   HGBA1C 7.0 08/20/2017   HGBA1C 8.8 05/10/2017   HGBA1C 9.2 (H) 02/27/2017   HGBA1C 9.0 (H) 07/06/2016   HGBA1C 9.2 (H) 04/02/2016   HGBA1C 8.3 (H) 09/19/2015   He is on: - Metformin ER 2000 mg with b'fast >> w/ dinner >> 1000 mg 2x a day - Farxiga 10 mg in a.m. >> off -  Mounjaro 10 mg weekly - had GERD  >> resolved He was previously on Tresiba 64 >> 40 units daily >> stopped ~02/2019 He was previously on Invokana. He was previously on Janumet but developed transaminitis. Previously on Lantus 65 units at bedtime.  He checks sugars >4x a day - with the Libre 3 CGM:  Prev.:  Previously:   He has hypoglycemia awareness in the 70s. Lowest: 90s, 110 >> 50s (one  night) >> 90s. Highest: 240 >> 170 >> 200 >> 268 > upper 200s.  Glucometer: [name];  One Touch Ultra 2  Pt's meals are: - Breakfast: bisquits - Lunch: sandwich, burgers (may skip) - Dinner: burgers, chicken, steak - Snacks: 0-1 He is still traveling a lot for work.  -No CKD, last BUN/creatinine:  Lab Results  Component Value Date   BUN 17 01/08/2023   BUN 20 12/10/2022   CREATININE 0.85 01/08/2023   CREATININE 0.83 12/10/2022   ACR normal: Lab Results  Component Value Date   MICRALBCREAT 4.1 06/06/2021   MICRALBCREAT 0.6 03/26/2019   MICRALBCREAT 4.6 07/14/2018   MICRALBCREAT 4.1 02/27/2017   MICRALBCREAT 0.6 09/19/2015   MICRALBCREAT 0.7 07/26/2014   On lisinopril 2.5.  -+ HL.  Last set of lipids: Lab Results  Component Value Date   CHOL 215 (H) 12/04/2022   HDL 40 12/04/2022   LDLCALC 151 (H) 12/04/2022   LDLDIRECT 163.5 05/16/2012   TRIG 131 12/04/2022   CHOLHDL 5.4 (H) 12/04/2022  Crestor, Lipitor, fluvastatin XL caused joint and muscle aches.  Co-Q10 was not helping.  However, he then started on Crestor 5 mg daily and Zetia.  He had muscle cramps >> stopped Crestor, then restarted and increased to 20 mg.  He continues on Zetia 10 mg daily and Repatha now.  -  last eye exam was: 11/23/2022: No DR, prev.+DR  - + some numbness and tingling in feet.  Last foot exam: 07/03/2022. He had low back pain, and is now s/p back sx. >> no pain anymore but, some pain/cramping in his legs - plantar fasciitis - podiatrist gave him a steroid inj. Prev. On ALA, B complex. Now on MVI only.  His wife and daughter moved here from [place] in 09/2019.  ROS: + see HPI  I reviewed pt's medications, allergies, PMH, social hx, family hx, and changes were documented in the history of present illness. Otherwise, unchanged from my initial visit note.  Past Medical History:  Diagnosis Date   Clotting disorder (HCC)    DVT   Diabetes mellitus    DVT (deep venous  thrombosis) (HCC)    Family history of polyps in the colon    Fatty liver    GERD (gastroesophageal reflux disease)    History of chicken pox    Hyperlipidemia    no meds now 05-28-16   Obesity    Sleep apnea    not using CPAP at this time 05-28-16   Past Surgical History:  Procedure Laterality Date   COLONOSCOPY     2012, 2017   LEFT HEART CATH AND CORONARY ANGIOGRAPHY N/A 01/14/2023   Procedure: LEFT HEART CATH AND CORONARY ANGIOGRAPHY;  Surgeon: [place], Peter M, MD;  Location: [facility] INVASIVE CV LAB;  Service: Cardiovascular;  Laterality: N/A;   LUMBAR DISC SURGERY     L4/L5   PILONIDAL CYST EXCISION     2001   Social History   Social History   Marital status: Single    Spouse name: N/A   Number of children: 0   Social History Main Topics   Smoking status: Current Every Day Smoker    Packs/day: 1.00    Years: 20.00    Types: Cigarettes   Smokeless tobacco: Never Used   Alcohol use Yes     Comment: 10-15 per week, usually on the weekends   Drug use: No   Social History Narrative   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, travels for work   Engaged 2017   Current Outpatient Medications on File Prior to Visit  Medication Sig Dispense Refill   omeprazole (PRILOSEC) 20 MG capsule Take 1 capsule (20 mg total) by mouth daily.     amLODipine (NORVASC) 2.5 MG tablet Take 1 tablet (2.5 mg total) by mouth daily. 90 tablet 3   aspirin EC 81 MG tablet Take 1 tablet (81 mg total) by mouth daily. Swallow whole. 90 tablet 3   Continuous Blood Gluc Sensor (FREESTYLE LIBRE 3 SENSOR) MISC 1 each by Does not apply route every 14 (fourteen) days. 6 each 3   Evolocumab (REPATHA SURECLICK) 140 MG/ML SOAJ Inject 140 mg into the skin every 14 (fourteen) days. 2 mL 12   ezetimibe (ZETIA) 10 MG tablet Take 1 tablet (10 mg total) by mouth daily. 90 tablet 3   famotidine (PEPCID) 20 MG tablet Take 1 tablet (20 mg total) by mouth 2 (two) times daily.     ibuprofen (ADVIL) 200 MG tablet  Take 1-3 tablets (200-600 mg total) by mouth every 8 (eight) hours as needed (with food).     lisinopril (ZESTRIL) 2.5 MG tablet TAKE ONE TABLET BY MOUTH ONE TIME DAILY 90 tablet 0   loratadine (CLARITIN) 10 MG tablet Take 1 tablet (10 mg total) by mouth daily. 30 tablet 12   metFORMIN (GLUCOPHAGE-XR) 500  MG 24 hr tablet Take 2 tablets (1,000 mg total) by mouth daily with breakfast.     metoprolol tartrate (LOPRESSOR) 25 MG tablet Take 1 tablet (25 mg total) by mouth 2 (two) times daily. 180 tablet 3   nitroGLYCERIN (NITROSTAT) 0.4 MG SL tablet Place 1 tablet (0.4 mg total) under the tongue every 5 (five) minutes as needed for chest pain. Max 3 doses in 15 minutes. 25 tablet 3   rosuvastatin (CRESTOR) 20 MG tablet Take 1 tablet (20 mg total) by mouth at bedtime. 90 tablet 3   tirzepatide (MOUNJARO) 10 MG/0.5ML Pen Inject 10 mg into the skin once a week.     No current facility-administered medications on file prior to visit.   Allergies  Allergen Reactions   Other Anaphylaxis    Bee stings   Chantix [Varenicline Tartrate]     irritable   Januvia [Sitagliptin Phosphate]     Inc in LFTs. Occurred with janumet, but prev tolerated metformin alone   Lipitor [Atorvastatin Calcium]     myalgia   Family History  Problem Relation Age of Onset   Diabetes Mother    Stroke Mother    Diabetes Father    Prostate cancer Father        dx'd in his early 29s   Hyperlipidemia Brother    Colon cancer Paternal Uncle        dx'd near age 9   Diabetes Maternal Uncle    Esophageal cancer Neg Hx    Stomach cancer Neg Hx    Rectal cancer Neg Hx   Pt has FH of DM in Mother, father, M uncle.   PE: BP 106/60 (BP Location: Left Arm, Patient Position: Sitting, Cuff Size: Normal)   Pulse 83   Wt 282 lb (127.9 kg)   SpO2 98%   BMI 38.25 kg/m  Wt Readings from Last 3 Encounters:  02/19/23 282 lb (127.9 kg)  02/07/23 279 lb (126.6 kg)  01/30/23 290 lb (131.5 kg)   Constitutional: overweight, in  NAD Eyes:  EOMI, no exophthalmos ENT: no neck masses, no cervical lymphadenopathy Cardiovascular: RRR, No MRG Respiratory: CTA B Musculoskeletal: no deformities Skin:no rashes Neurological: no tremor with outstretched hands  ASSESSMENT: 1. DM2, now noninsulin-dependent, uncontrolled, with complications - PN - DR  -He had a freestyle libre CGM in the past but this kept coming off -No personal history of pancreatitis or family history of medullary thyroid cancer  2. HL  3. Obesity class II  PLAN:  1. Patient with longstanding, uncontrolled, type 2 diabetes, on metformin, SGLT2 inhibitor and GLP-1/GIP receptor agonist with improved control after the start of coronavirus pandemic after he cut out eating out and also alcohol.  However, he then relaxed his diet and HbA1c increased.  The sugars started to improve again after switching from Trulicity to [facility].  At last visit, reviewing his CGM downloads, sugars were well-controlled throughout the day with only occasional hyperglycemic spikes after breakfast and dinner.  We did not change his regimen but I advised him to look at possible culprits and staying well-hydrated.  Latest HbA1c was slightly higher, 3 months ago, at 6.5%, increased from 6.4%. CGM interpretation: -At today's visit, we reviewed his CGM downloads: It appears that 95% of values are in target range (goal >70%), while 5% are higher than 180 (goal <25%), and 0% are lower than 70 (goal <4%).  The calculated average blood sugar is 129.  The projected HbA1c for the next 3 months (GMI) is 6.4%. -  Reviewing the CGM trends, sugars are almost entirely fluctuating within the target range with occasional hyperglycemic spikes usually after breakfast, but overall, good blood sugar control.  At today's visit, he tells me that he has been off Mounjaro and sugars are higher, which is likely the cause for his elevated HbA1c today.  However, for the last 2 weeks, sugars have been better  controlled.  He tells me he is off [place] as he wanted to see if this would cause him planta fasciitis or muscle cramps.  We discussed about staying well-hydrated while on Farxiga since dehydration itself can cause muscle cramps.  I did advise him to start it back, though.  Will also continue Mounjaro and metformin. - I suggested to:  Patient Instructions  Please continue: - Metformin ER 1000 mg 2x a day - Mounjaro 10 mg weekly  Restart: - Farxiga 10 mg daily before b'fast  Please return in 4-6 months.  - we checked his HbA1c: 7.3% (higher) - advised to check sugars at different times of the day - 4x a day, rotating check times - advised for yearly eye exams >> he is UTD - he previously had some decrease sensation in his feet.  I recommended alpha-lipoic acid and B complex.  He started on this, but he is currently off.  We discussed about possibly restarting.  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  He was given a steroid injection and was planning a brace.  He was planning to go back to orthopedics to see if his back pain could the culprit for his leg pain and muscle cramping.  We discussed that, especially in the setting of neuropathy, he may need neurology evaluation - return to clinic in 4-6 months   2. HL -Reviewed latest lipid panel from 11/2022: LDL above our goal of less than 70, otherwise fractions at goal: Lab Results  Component Value Date   CHOL 215 (H) 12/04/2022   HDL 40 12/04/2022   LDLCALC 151 (H) 12/04/2022   LDLDIRECT 163.5 05/16/2012   TRIG 131 12/04/2022   CHOLHDL 5.4 (H) 12/04/2022  -He had joint pains from statins, including from fluvastatin XL.  He then started on Crestor 5 mg daily, which he tolerated fairly well, but now off due to muscle cramps.  Muscle cramps continued even though he stopped Crestor.  He is back on Crestor, now 20 mg daily, and also on Repatha and Zetia 10 mg daily.  He continues have muscle cramps.  3.  Obesity class II -Will  restart his SGLT 2 inhibitor and continue the GLP-1/GIP receptor agonist which should also help with weight loss -Lost 31 pounds before the last 4 visits combined of which 8 before last visit -He gained 15 pounds since last visit  Component     Latest Ref Rng 02/19/2023  Hemoglobin A1C     4.0 - 5.6 % 7.3 !   Microalb, Ur     0.0 - 1.9 mg/dL 3.8 (H)   Creatinine,U     mg/dL 161.0   MICROALB/CREAT RATIO     0.0 - 30.0 mg/g 1.5   ACR normal.  Carlus Pavlov, MD PhD [facility] Endocrinology

## 2023-02-19 NOTE — Telephone Encounter (Signed)
Called patient reviewed all information and repeated back to me. Will call if any questions.  ? ?

## 2023-02-19 NOTE — Patient Instructions (Addendum)
Please continue: - Metformin ER 1000 mg 2x a day - Mounjaro 10 mg weekly  Restart: - Farxiga 10 mg daily before b'fast  Please return in 4-6 months.

## 2023-02-21 ENCOUNTER — Encounter: Payer: Self-pay | Admitting: *Deleted

## 2023-03-11 ENCOUNTER — Encounter: Payer: Self-pay | Admitting: Cardiovascular Disease

## 2023-03-11 ENCOUNTER — Ambulatory Visit: Payer: BC Managed Care – PPO | Attending: Cardiovascular Disease | Admitting: Cardiovascular Disease

## 2023-03-11 DIAGNOSIS — I25118 Atherosclerotic heart disease of native coronary artery with other forms of angina pectoris: Secondary | ICD-10-CM | POA: Diagnosis not present

## 2023-03-11 DIAGNOSIS — Z794 Long term (current) use of insulin: Secondary | ICD-10-CM

## 2023-03-11 DIAGNOSIS — R9431 Abnormal electrocardiogram [ECG] [EKG]: Secondary | ICD-10-CM | POA: Diagnosis not present

## 2023-03-11 DIAGNOSIS — G4733 Obstructive sleep apnea (adult) (pediatric): Secondary | ICD-10-CM | POA: Diagnosis not present

## 2023-03-11 DIAGNOSIS — Z79899 Other long term (current) drug therapy: Secondary | ICD-10-CM

## 2023-03-11 DIAGNOSIS — E66812 Obesity, class 2: Secondary | ICD-10-CM

## 2023-03-11 DIAGNOSIS — E782 Mixed hyperlipidemia: Secondary | ICD-10-CM

## 2023-03-11 DIAGNOSIS — E119 Type 2 diabetes mellitus without complications: Secondary | ICD-10-CM

## 2023-03-11 DIAGNOSIS — Z6837 Body mass index (BMI) 37.0-37.9, adult: Secondary | ICD-10-CM

## 2023-03-11 DIAGNOSIS — Z720 Tobacco use: Secondary | ICD-10-CM

## 2023-03-11 DIAGNOSIS — Z7985 Long-term (current) use of injectable non-insulin antidiabetic drugs: Secondary | ICD-10-CM

## 2023-03-11 DIAGNOSIS — E785 Hyperlipidemia, unspecified: Secondary | ICD-10-CM

## 2023-03-11 NOTE — Patient Instructions (Signed)
Medication Instructions:  Your physician recommends that you continue on your current medications as directed. Please refer to the Current Medication list given to you today.  *If you need a refill on your cardiac medications before your next appointment, please call your pharmacy*   Lab Work: Your physician recommends that you return in August to  have labs drawn: CMET, Lipids, Lp(a)  If you have labs (blood work) drawn today and your tests are completely normal, you will receive your results only by: MyChart Message (if you have MyChart) OR A paper copy in the mail If you have any lab test that is abnormal or we need to change your treatment, we will call you to review the results.   Testing/Procedures: None   Follow-Up: At [facility], you and your health needs are our priority.  As part of our continuing mission to provide you with exceptional heart care, we have created designated Provider Care Teams.  These Care Teams include your primary Cardiologist (physician) and Advanced Practice Providers (APPs -  Physician Assistants and Nurse Practitioners) who all work together to provide you with the care you need, when you need it.   Your next appointment:   Beginning of October  Provider:   Nicki Guadalajara, MD

## 2023-03-11 NOTE — Progress Notes (Signed)
Cardiology Office Note    Date:  03/15/2023   ID:  Jerry Eaton, DOB 11-01-1970, MRN [id number]  PCP:  Joaquim Nam, MD  Cardiologist:  Nicki Guadalajara, MD   2 month F/U cardiology evaluation initially referred by Crawford Givens, MD for evaluation of chest pain  History of Present Illness:  Jerry Eaton is a 52 y.o. male who is followed by Dr. Para March for primary care.  I saw him for my initial evaluation on December 03, 2022 an last saw him on January 17, 2023.   Jerry Eaton has a history of diabetes mellitus for at least 10 years and most recently is on Farxiga and metformin.  He also is on low-dose lisinopril not for hypertension but for potential renal preservation.  He had recently traveled to [place] and had to climb over 270 steps.  He would experience some shortness of breath.  Recently, he has experienced some vague chest pressure in his neck and jaw and experienced symptoms on October 14, 2022.  He did not go to the emergency room.  He felt this discomfort did not feel like his previous GERD however his symptoms did improve with burping.  He was evaluated by Dr. Para March on November 23, 2022.  He has had several episodes of this lower sternal burning with his last episode approximately 1 week before his November 23, 2018 for evaluation.  He had stopped taking Mounjaro.  He has a history of hyperlipidemia and had been on rosuvastatin which he had stopped and is currently taking Zetia 10 mg daily. He tells me he was diagnosed with very mild sleep apnea approximately 15 years ago.  He does admit to snoring.  He believes his sleep is nonrestorative.  He cannot sleep on his back.  Epworth Sleepiness Scale score was calculated in the office today and this endorsed at an, consistent with excessive daytime sleepiness.    During his initial evaluation, I recommended that he undergo a 2D echo Doppler study to evaluate his recent symptomatology and shortness of breath.  With his chest  tightness I scheduled him for coronary CTA to assess both calcium score as well as potential luminal obstruction.  I also recommended he undergo a sleep study with a high likelihood for obstructive sleep apnea.  Coronary CTA done on December 11, 2022 showed significant increase calcium score at 716 representing 99th percentile.  There appeared to be 8 chronically occluded mid circumflex.  There was moderate plaque in the very distal LAD, proximal diagonal 1 and otherwise mild disease.  FFR analysis was 0.77 in the very distal LAD most likely due to vessel tapering and 0.69 and diagonal 1.  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  RCA had mild plaque with normal FFR.  A 2D echo Doppler study on January 01, 2023 showed a EF of 55 to 60% without wall motion abnormality with mild concentric LVH.  Apparently prior to my subsequent evaluation he had been seen by Valentina Shaggy, NP after he had developed a recurrent episode of pain radiating to his jaw.  At that time, she reviewed the CTA findings with Dr. Swaziland and on January 14, 2023, earlier this week had undergone definitive cardiac catheterization.  He was found to have two-vessel obstructive disease with borderline obstructive disease in the first diagonal vessel which was tortuous proximally.  The left circumflex had diffuse severe obstruction but was very small caliber and medical therapy was recommended.  When I saw  him in follow-up of his catheterization on January 17, 2023, I reviewed his angiographic findings and agreed with the recommendation for medical therapy.  During that evaluation, he was on lisinopril 2.5 mg and metoprolol to tartrate 25 mg twice a day for hypertension and CAD.  I recommended the addition of amlodipine at 2.5 mg daily and depending upon blood pressure response this could be further titrated to 5 mg.  His BP at that day was 116/80.  I also recommended he increase rosuvastatin to 20 mg for more aggressive lipid-lowering therapy  and restart Zetia with target LDL less than 55.  He is diabetic on Farxiga and metformin.  At his initial visit due to concerns for obstructive sleep apnea I had recommended sleep evaluation.  Since I last saw him, he has done well on medical therapy.  He denies any recent anginal symptomatology.  He quit smoking since his cardiac catheterization.  He continues to be on amlodipine 2.5 mg, lisinopril 2.5 mg, metoprolol tartrate 25 mg twice a day.  He is diabetic on metformin and Mounjaro.  He has recently been started on Repatha 140 mg every 2 weeks to take in addition to rosuvastatin 20 mg.  He had undergone a home sleep study on January 30, 2023 which showed mild overall obstructive sleep apnea with an AHI of 8.2/h.  However supine sleep was absent and I suspect the overall severity may have been under estimated if supine sleep was present.  He had moderate oxygen desaturation to nadir of 86%.  He snored for 54.3 minutes of 14.9% of his sleep.  He presents for evaluation.  Past Medical History:  Diagnosis Date   Clotting disorder (HCC)    DVT   Diabetes mellitus    DVT (deep venous thrombosis) (HCC)    Family history of polyps in the colon    Fatty liver    GERD (gastroesophageal reflux disease)    History of chicken pox    Hyperlipidemia    no meds now 05-28-16   Obesity    Sleep apnea    not using CPAP at this time 05-28-16    Past Surgical History:  Procedure Laterality Date   COLONOSCOPY     2012, 2017   LEFT HEART CATH AND CORONARY ANGIOGRAPHY N/A 01/14/2023   Procedure: LEFT HEART CATH AND CORONARY ANGIOGRAPHY;  Surgeon: Swaziland, Peter M, MD;  Location: [facility] INVASIVE CV LAB;  Service: Cardiovascular;  Laterality: N/A;   LUMBAR DISC SURGERY     L4/L5   PILONIDAL CYST EXCISION     2001    Current Medications: Outpatient Medications Prior to Visit  Medication Sig Dispense Refill   amLODipine (NORVASC) 2.5 MG tablet Take 1 tablet (2.5 mg total) by mouth daily. 90 tablet 3   aspirin EC  81 MG tablet Take 1 tablet (81 mg total) by mouth daily. Swallow whole. 90 tablet 3   Continuous Blood Gluc Sensor (FREESTYLE LIBRE 3 SENSOR) MISC 1 each by Does not apply route every 14 (fourteen) days. 6 each 3   Evolocumab (REPATHA SURECLICK) 140 MG/ML SOAJ Inject 140 mg into the skin every 14 (fourteen) days. 2 mL 12   ezetimibe (ZETIA) 10 MG tablet Take 1 tablet (10 mg total) by mouth daily. 90 tablet 3   famotidine (PEPCID) 20 MG tablet Take 1 tablet (20 mg total) by mouth 2 (two) times daily.     ibuprofen (ADVIL) 200 MG tablet Take 1-3 tablets (200-600 mg total) by mouth every 8 (eight) hours  as needed (with food).     lisinopril (ZESTRIL) 2.5 MG tablet TAKE ONE TABLET BY MOUTH ONE TIME DAILY 90 tablet 0   loratadine (CLARITIN) 10 MG tablet Take 1 tablet (10 mg total) by mouth daily. 30 tablet 12   metFORMIN (GLUCOPHAGE-XR) 500 MG 24 hr tablet Take 2 tablets (1,000 mg total) by mouth daily with breakfast.     metoprolol tartrate (LOPRESSOR) 25 MG tablet Take 1 tablet (25 mg total) by mouth 2 (two) times daily. 180 tablet 3   rosuvastatin (CRESTOR) 20 MG tablet Take 1 tablet (20 mg total) by mouth at bedtime. 90 tablet 3   tirzepatide (MOUNJARO) 10 MG/0.5ML Pen Inject 10 mg into the skin once a week.     nitroGLYCERIN (NITROSTAT) 0.4 MG SL tablet Place 1 tablet (0.4 mg total) under the tongue every 5 (five) minutes as needed for chest pain. Max 3 doses in 15 minutes. (Patient not taking: Reported on 03/11/2023) 25 tablet 3   omeprazole (PRILOSEC) 20 MG capsule Take 1 capsule (20 mg total) by mouth daily.     No facility-administered medications prior to visit.     Allergies:   Other, Chantix [varenicline tartrate], Januvia [sitagliptin phosphate], and Lipitor [atorvastatin calcium]   Social History   Socioeconomic History   Marital status: Married    Spouse name: Not on file   Number of children: 0   Years of education: Not on file   Highest education level: Not on file   Occupational History   Occupation: Editor, commissioning  Tobacco Use   Smoking status: Former    Packs/day: 1.00    Years: 20.00    Additional pack years: 0.00    Total pack years: 20.00    Types: Cigarettes   Smokeless tobacco: Never   Tobacco comments:    12/03/2022-Patient smokes a pack daily  Substance and Sexual Activity   Alcohol use: Yes    Comment: very rare   Drug use: No   Sexual activity: Yes  Other Topics Concern   Not on file  Social History Narrative   [employer] grad   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, travels for work   Married 2019   1 stepdaughter   Social Determinants of Health   Financial Resource Strain: Not on file  Food Insecurity: Not on file  Transportation Needs: Not on file  Physical Activity: Not on file  Stress: Not on file  Social Connections: Not on file    He was born in [place].  He attended [employer].  He has a degree from [employer] and majored in zoology.  He is married for 4-1/2 years.  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  He has a 30-year history of tobacco use, currently 1 pack/day.  He does drink occasional beer and liquor.  He does not routinely exercise.  Family History:  The patient's family history includes Colon cancer in his paternal uncle; Diabetes in his father, maternal uncle, and mother; Hyperlipidemia in his brother; Prostate cancer in his father; Stroke in his mother.  His mother is 57 and has a history of stroke, atrial fibrillation and diabetes.  Father died at age 62 and had heart disease, cancer and diabetes.  He has a brother age 54 and a sister age 6.  ROS General: Negative; No fevers, chills, or night sweats;  HEENT: Negative; No changes in vision or hearing, sinus congestion, difficulty swallowing Pulmonary: Negative; No  cough, wheezing, shortness of breath, hemoptysis Cardiovascular: See HPI GI: Negative; No nausea, vomiting, diarrhea,  or abdominal pain GU: Negative; No dysuria, hematuria, or difficulty voiding Musculoskeletal: Negative; no myalgias, joint pain, or weakness Hematologic/Oncology: Negative; no easy bruising, bleeding Endocrine: Diabetes mellitus Neuro: Negative; no changes in balance, headaches Skin: Negative; No rashes or skin lesions Psychiatric: Negative; No behavioral problems, depression Sleep: Positive for snoring, daytime sleepiness, nonrestorative sleep, and difficulty sleeping on his back.negative; No bruxism, restless legs, hypnogognic hallucinations, no cataplexy   Epworth Sleepiness Scale: Situation   Chance of Dozing/Sleeping (0 = never , 1 = slight chance , 2 = moderate chance , 3 = high chance )   sitting and reading 2   watching TV 2   sitting inactive in a public place 1   being a passenger in a motor vehicle for an hour or more 3   lying down in the afternoon 1   sitting and talking to someone 0   sitting quietly after lunch (no alcohol) 1   while stopped for a few minutes in traffic as the driver 0   Total Score  10    Other comprehensive 14 point system review is negative.   PHYSICAL EXAM:   VS:  BP 120/82   Pulse 75   Ht 6' (1.829 m)   Wt 275 lb 12.8 oz (125.1 kg)   SpO2 95%   BMI 37.41 kg/m     Repeat blood pressure by me was 110/80  Wt Readings from Last 3 Encounters:  03/11/23 275 lb 12.8 oz (125.1 kg)  02/19/23 282 lb (127.9 kg)  02/07/23 279 lb (126.6 kg)    General: Alert, oriented, no distress.  Skin: normal turgor, no rashes, warm and dry HEENT: Normocephalic, atraumatic. Pupils equal round and reactive to light; sclera anicteric; extraocular muscles intact;  Nose without nasal septal hypertrophy Mouth/Parynx benign; Mallinpatti scale 3 Neck: No JVD, no carotid bruits; normal carotid upstroke Lungs: clear to ausculatation and percussion; no wheezing or rales Chest wall: without tenderness to palpitation Heart: PMI not displaced, RRR, s1 s2 normal, 1/6  systolic murmur, no diastolic murmur, no rubs, gallops, thrills, or heaves Abdomen: Central adiposity; soft, nontender; no hepatosplenomehaly, BS+; abdominal aorta nontender and not dilated by palpation. Back: no CVA tenderness Pulses 2+ Musculoskeletal: full range of motion, normal strength, no joint deformities Extremities: no clubbing cyanosis or edema, Homan's sign negative  Neurologic: grossly nonfocal; Cranial nerves grossly wnl Psychologic: Normal mood and affect   Studies/Labs Reviewed:   Mar 11, 2023 ECG (independently read by me): NSR at 75, Q III, aVF  January 17, 2023 ECG (independently read by me): NSR at 64, Q III, aVF, no ectopy  December 03, 2022 ECG (independently read by me): Sinus rhythm at 90, PAC, Left axis deviation, Inferior Q wave III, aVF; QTc 455 msec  Recent Labs:    Latest Ref Rng & Units 01/08/2023   10:26 AM 12/10/2022   10:20 AM 12/04/2022    8:22 AM  BMP  Glucose 70 - 99 mg/dL 161  096  045   BUN 6 - 24 mg/dL 17  20  19    Creatinine 0.76 - 1.27 mg/dL 4.09  8.11  9.14   BUN/Creat Ratio 9 - 20 20   23    Sodium 134 - 144 mmol/L 142  135  140   Potassium 3.5 - 5.2 mmol/L 5.2  4.6  4.6   Chloride 96 - 106 mmol/L 104  103  101  CO2 20 - 29 mmol/L 22  23  22    Calcium 8.7 - 10.2 mg/dL 9.7  9.2  9.3         Latest Ref Rng & Units 11/23/2022    9:58 AM 06/15/2022    3:04 PM 06/06/2021    8:32 AM  Hepatic Function  Total Protein 6.0 - 8.3 g/dL 7.1  6.8  8.0   Albumin 3.5 - 5.2 g/dL 4.4   4.6   AST 0 - 37 U/L 16  17  18    ALT 0 - 53 U/L 21  23  22    Alk Phosphatase 39 - 117 U/L 59   70   Total Bilirubin 0.2 - 1.2 mg/dL 0.7  0.5  0.4        Latest Ref Rng & Units 01/08/2023   10:26 AM 12/10/2022   10:20 AM 11/23/2022    9:58 AM  CBC  WBC 3.4 - 10.8 x10E3/uL 6.3  6.8  5.8   Hemoglobin 13.0 - 17.7 g/dL 13.0  86.5  78.4   Hematocrit 37.5 - 51.0 % 45.6  44.3  44.6   Platelets 150 - 450 x10E3/uL 293  303  278.0    Lab Results  Component Value Date    MCV 85 01/08/2023   MCV 85.9 12/10/2022   MCV 85.9 11/23/2022   Lab Results  Component Value Date   TSH 0.88 11/23/2022   Lab Results  Component Value Date   HGBA1C 7.3 (A) 02/19/2023     BNP No results found for: "BNP"  ProBNP No results found for: "PROBNP"   Lipid Panel     Component Value Date/Time   CHOL 215 (H) 12/04/2022 0822   TRIG 131 12/04/2022 0822   HDL 40 12/04/2022 0822   CHOLHDL 5.4 (H) 12/04/2022 0822   CHOLHDL 4.5 06/15/2022 1504   VLDL 22.8 10/06/2021 0820   LDLCALC 151 (H) 12/04/2022 0822   LDLCALC 90 06/15/2022 1504   LDLDIRECT 163.5 05/16/2012 0812   LABVLDL 24 12/04/2022 0822     RADIOLOGY: No results found.   Additional studies/ records that were reviewed today include:  I reviewed the records of Dr. Crawford Givens.  CARDIAC CATH: 01/14/2023   1st Diag lesion is 70% stenosed.   Prox LAD to Dist LAD lesion is 20% stenosed.   Ramus lesion is 25% stenosed with 25% stenosed side branch in Lat Ramus.   Prox Cx to Dist Cx lesion is 95% stenosed.   Prox RCA to Mid RCA lesion is 30% stenosed.   The left ventricular systolic function is normal.   LV end diastolic pressure is normal.   The left ventricular ejection fraction is 55-65% by visual estimate.   2 vessel obstructive CAD. There is borderline obstructive disease in the first diagonal which is tortuous proximally. The LCx is a small caliber vessel with diffuse severe obstruction Normal LV function Normal LV EDP   Plan: recommend aggressive risk factor modification and antianginal therapy. The LCx is poorly suited for PCI given small diameter and diffuse disease.        ASSESSMENT:    1. Coronary artery disease of native artery of native heart with stable angina pectoris (HCC)   2. Hyperlipidemia with target LDL less than 55   3. OSA (obstructive sleep apnea)   4. Type 2 diabetes mellitus without complication, without long-term current use of insulin (HCC)   5. Tobacco use: 30 year  history, quit March 2024   6. Class 2  severe obesity due to excess calories with serious comorbidity and body mass index (BMI) of 37.0 to 37.9 in adult (HCC)   7. Abnormal EKG   8. Medication management     PLAN:  Jerry Eaton is a 52 year old gentleman who has a 10-year history of diabetes mellitus, 30-year history of prior tobacco use, history of GERD, and recently has experienced episodes of lower chest discomfort radiating to his back and also experiencing some lower jaw discomfort.  His symptoms do improve somewhat with burping.  He has been on Farxiga 10 mg and metformin 1000 mg twice a day for hyperlipidemia.  Remotely, he had been on rosuvastatin 5 mg but stopped therapy.  Laboratory in June 15, 2022 showed total cholesterol 158, HDL 35, triglycerides were significantly elevated at 237 and LDL cholesterol was 90.  I suspect he most likely had increased LDL particle number with probable small LDL particles if NMR LipoProfile analysis was performed.  He had traveled recently to [place] and did hike and climb a significant number of steps to his temple.  He had recently developed chest pain.  An echo Doppler study revealed normal systolic function with EF 55 to 60% with mild LVH.  There was very mild dilation of aortic root at 37 mm.  Coronary CTA revealed an elevated calcium score at 716 representing 99th percentile.  At cardiac catheterization he was found to have two-vessel obstructive disease with severe stenosis in a small caliber mid distal circumflex vessel.  There is also generalized 70% stenosis in a tortuous first diagonal vessel.  At his subsequent office visit I added amlodipine to his current regimen.  Fortunately, he quit tobacco use following his catheterization.  Presently he has been chest pain-free on his current regimen of low-dose amlodipine 2.5 mg, lisinopril 2.5 mg and metoprolol 25 mg twice a day.  He was recently started on Repatha injections to take in addition to  rosuvastatin 20 mg.  He just had his third dose of Repatha.  Target LDL is less than 55.  He is now on Mounjaro in addition to metformin for diabetes mellitus.  His blood pressure today is stable.  I have recommended follow-up laboratory in August with a comprehensive metabolic panel, and lipid panel.  I reviewed his sleep study which demonstrated at least mild overall sleep apnea; however he had complete abstinence of supine sleep.  He states he has difficulty sleeping on his back and I suspect his overall severity is underestimated if supine sleep but been present on that study.  He is awaiting initiation of AutoPap therapy.  I will see him in several months for follow-up evaluation and further recommendations will be made at that time.   Medication Adjustments/Labs and Tests Ordered: Current medicines are reviewed at length with the patient today.  Concerns regarding medicines are outlined above.  Medication changes, Labs and Tests ordered today are listed in the Patient Instructions below. Patient Instructions  Medication Instructions:  Your physician recommends that you continue on your current medications as directed. Please refer to the Current Medication list given to you today.  *If you need a refill on your cardiac medications before your next appointment, please call your pharmacy*   Lab Work: Your physician recommends that you return in August to  have labs drawn: CMET, Lipids, Lp(a)  If you have labs (blood work) drawn today and your tests are completely normal, you will receive your results only by: MyChart Message (if you have MyChart) OR A paper  copy in the mail If you have any lab test that is abnormal or we need to change your treatment, we will call you to review the results.   Testing/Procedures: None   Follow-Up: At [facility], you and your health needs are our priority.  As part of our continuing mission to provide you with exceptional heart care, we have  created designated Provider Care Teams.  These Care Teams include your primary Cardiologist (physician) and Advanced Practice Providers (APPs -  Physician Assistants and Nurse Practitioners) who all work together to provide you with the care you need, when you need it.   Your next appointment:   Beginning of October  Provider:   Nicki Guadalajara, MD     Signed, Nicki Guadalajara, MD  03/15/2023 7:45 PM    [facility] [address], [place], [place]  [address] Phone: [phone]

## 2023-03-15 ENCOUNTER — Encounter: Payer: Self-pay | Admitting: Cardiovascular Disease

## 2023-03-19 DIAGNOSIS — D105 Benign neoplasm of other parts of oropharynx: Secondary | ICD-10-CM | POA: Insufficient documentation

## 2023-03-19 DIAGNOSIS — K219 Gastro-esophageal reflux disease without esophagitis: Secondary | ICD-10-CM | POA: Diagnosis not present

## 2023-03-19 DIAGNOSIS — Z87891 Personal history of nicotine dependence: Secondary | ICD-10-CM | POA: Diagnosis not present

## 2023-03-19 DIAGNOSIS — R070 Pain in throat: Secondary | ICD-10-CM | POA: Diagnosis not present

## 2023-03-19 DIAGNOSIS — M26609 Unspecified temporomandibular joint disorder, unspecified side: Secondary | ICD-10-CM | POA: Diagnosis not present

## 2023-03-22 DIAGNOSIS — B358 Other dermatophytoses: Secondary | ICD-10-CM | POA: Diagnosis not present

## 2023-03-22 DIAGNOSIS — I872 Venous insufficiency (chronic) (peripheral): Secondary | ICD-10-CM | POA: Diagnosis not present

## 2023-03-22 DIAGNOSIS — D225 Melanocytic nevi of trunk: Secondary | ICD-10-CM | POA: Diagnosis not present

## 2023-03-22 DIAGNOSIS — Z1283 Encounter for screening for malignant neoplasm of skin: Secondary | ICD-10-CM | POA: Diagnosis not present

## 2023-04-02 ENCOUNTER — Ambulatory Visit (INDEPENDENT_AMBULATORY_CARE_PROVIDER_SITE_OTHER): Payer: BC Managed Care – PPO | Admitting: Podiatry

## 2023-04-02 ENCOUNTER — Encounter: Payer: Self-pay | Admitting: Podiatry

## 2023-04-02 DIAGNOSIS — M722 Plantar fascial fibromatosis: Secondary | ICD-10-CM

## 2023-04-02 MED ORDER — TRIAMCINOLONE ACETONIDE 40 MG/ML IJ SUSP
20.0000 mg | Freq: Once | INTRAMUSCULAR | Status: AC
  Administered 2023-04-02: 20 mg

## 2023-04-02 NOTE — Progress Notes (Signed)
He presents today for flareup of his Planter fasciitis left over right.  States that anything really got hurting bad back in March but is feeling somewhat better lately.  Objective: Vital signs are stable he is alert and oriented x 3.  Pulses are palpable.  He has pain on palpation medial calcaneal tubercle of his left heel.  None on the right.  Assessment: Recurrence of Planter fasciitis right.  Plan: Recommended injection today which we performed after sterile Betadine skin prep.  I injected 10 mg Kenalog 5 mg Marcaine.  Tolerated procedure well.  He will continue the use of good shoes.  He will continue use of his orthotics and plantar fascial brace as necessary.  Follow-up with him in 1 month if necessary may need to consider MRI if not improved.  Need to consider plantar fascial tear

## 2023-04-10 ENCOUNTER — Other Ambulatory Visit: Payer: Self-pay | Admitting: Internal Medicine

## 2023-04-29 ENCOUNTER — Telehealth: Payer: Self-pay

## 2023-04-29 DIAGNOSIS — E1140 Type 2 diabetes mellitus with diabetic neuropathy, unspecified: Secondary | ICD-10-CM

## 2023-04-29 MED ORDER — SEMAGLUTIDE (1 MG/DOSE) 4 MG/3ML ~~LOC~~ SOPN: 1.0000 mg | PEN_INJECTOR | SUBCUTANEOUS | 0 refills | Status: DC

## 2023-04-29 NOTE — Telephone Encounter (Signed)
Yes, we could send Ozempic - 1 pen of the 1 mg dose.  I will see him later this month and if he tolerates this dose well, we can go up to 2 mg weekly at that time.

## 2023-04-29 NOTE — Telephone Encounter (Signed)
Pharmacy is asking if it is ok to switch Jerry Eaton over from [place] to [place] and if so they will need a new Rx

## 2023-04-29 NOTE — Telephone Encounter (Signed)
1 mg pen of Ozempic has been sent to the pharmacy

## 2023-05-03 ENCOUNTER — Telehealth: Payer: Self-pay

## 2023-05-03 ENCOUNTER — Other Ambulatory Visit (HOSPITAL_COMMUNITY): Payer: Self-pay

## 2023-05-03 NOTE — Telephone Encounter (Signed)
PA needed for Ozempic

## 2023-05-03 NOTE — Telephone Encounter (Signed)
Patient Advocate Encounter   Received notification from pt msgs that prior authorization is required for Ozempic  Submitted: 05/03/23 Key [id number]  Status is APPROVED   Effective through 05/02/2024

## 2023-05-16 ENCOUNTER — Ambulatory Visit: Payer: BC Managed Care – PPO | Admitting: Podiatry

## 2023-06-11 ENCOUNTER — Other Ambulatory Visit: Payer: Self-pay | Admitting: Internal Medicine

## 2023-06-25 ENCOUNTER — Other Ambulatory Visit: Payer: Self-pay | Admitting: Internal Medicine

## 2023-06-25 MED ORDER — DAPAGLIFLOZIN PROPANEDIOL 10 MG PO TABS: 10.0000 mg | ORAL_TABLET | Freq: Every day | ORAL | 1 refills | Status: DC

## 2023-07-01 ENCOUNTER — Other Ambulatory Visit (HOSPITAL_COMMUNITY): Payer: Self-pay

## 2023-07-01 ENCOUNTER — Telehealth: Payer: Self-pay

## 2023-07-01 NOTE — Telephone Encounter (Signed)
Pharmacy Patient Advocate Encounter   Received notification from CoverMyMeds that prior authorization for Jerry Eaton is required/requested.   Insurance verification completed.   The patient is insured through [employer] .   Per test claim: PA required; PA submitted to [employer] via CoverMyMeds Key/confirmation #/EOC [id number] Status is pending

## 2023-07-02 ENCOUNTER — Other Ambulatory Visit (HOSPITAL_COMMUNITY): Payer: Self-pay

## 2023-07-02 NOTE — Telephone Encounter (Signed)
Pharmacy Patient Advocate Encounter  Received notification from [employer] that Prior Authorization for Dapagliflozin Propanediol 10mg  has been APPROVED from 06/01/23 to 06/30/24. Spoke to pharmacy to process.Copay is $0.    PA #/Case ID/Reference #: PA Case ID #: [id number]

## 2023-07-24 DIAGNOSIS — D2262 Melanocytic nevi of left upper limb, including shoulder: Secondary | ICD-10-CM | POA: Diagnosis not present

## 2023-07-24 DIAGNOSIS — Z1283 Encounter for screening for malignant neoplasm of skin: Secondary | ICD-10-CM | POA: Diagnosis not present

## 2023-07-24 DIAGNOSIS — D225 Melanocytic nevi of trunk: Secondary | ICD-10-CM | POA: Diagnosis not present

## 2023-07-24 DIAGNOSIS — D485 Neoplasm of uncertain behavior of skin: Secondary | ICD-10-CM | POA: Diagnosis not present

## 2023-07-29 ENCOUNTER — Encounter: Payer: Self-pay | Admitting: Internal Medicine

## 2023-07-29 ENCOUNTER — Ambulatory Visit (INDEPENDENT_AMBULATORY_CARE_PROVIDER_SITE_OTHER): Payer: BC Managed Care – PPO | Admitting: Internal Medicine

## 2023-07-29 VITALS — BP 120/78 | HR 75 | Ht 72.0 in | Wt 270.0 lb

## 2023-07-29 DIAGNOSIS — E1140 Type 2 diabetes mellitus with diabetic neuropathy, unspecified: Secondary | ICD-10-CM

## 2023-07-29 DIAGNOSIS — E66812 Obesity, class 2: Secondary | ICD-10-CM

## 2023-07-29 DIAGNOSIS — E785 Hyperlipidemia, unspecified: Secondary | ICD-10-CM | POA: Diagnosis not present

## 2023-07-29 DIAGNOSIS — Z794 Long term (current) use of insulin: Secondary | ICD-10-CM | POA: Diagnosis not present

## 2023-07-29 LAB — POCT GLYCOSYLATED HEMOGLOBIN (HGB A1C)
HbA1c POC (<> result, manual entry): 0 % (ref 4.0–5.6)
Hemoglobin A1C: 6.4 % — AB (ref 4.0–5.6)

## 2023-07-29 MED ORDER — FREESTYLE LIBRE 3 SENSOR MISC: 1.0000 | 3 refills | Status: AC

## 2023-07-29 MED ORDER — MOUNJARO 10 MG/0.5ML ~~LOC~~ SOAJ: 10.0000 mg | SUBCUTANEOUS | 3 refills | Status: DC

## 2023-07-29 NOTE — Patient Instructions (Addendum)
Please continue: - Metformin ER 1000 mg 2x a day - Farxiga 10 mg before breakfast - Mounjaro 10 mg weekly  Please return in 4-6 months.

## 2023-07-29 NOTE — Progress Notes (Signed)
Patient ID: [name], male   DOB: [birth year], 52 y.o.   MRN: [id number]   HPI: Jerry Eaton is a 52 y.o.-year-old male, returning for follow-up for DM2, dx in ~2013, insulin-dependent since 2016-2017,  now off insulin, uncontrolled, with complications (DR, PN). Last visit 5 months ago.  Interim history: No increased urination, blurry vision, nausea, chest pain.  He has increased muscle cramps and plantar fasciitis.  To find a possible culprit, he has been off Crestor, [place], [place], but the cramps persisted. He has GERD - on Pepcid now. He has some blurry vision when reading.  Reviewed HbA1c levels: Lab Results  Component Value Date   HGBA1C 7.3 (A) 02/19/2023   HGBA1C 6.5 11/23/2022   HGBA1C 6.4 (A) 08/17/2022   HGBA1C 6.6 (A) 02/09/2022   HGBA1C 6.6 (A) 10/06/2021   HGBA1C 7.5 (A) 06/06/2021   HGBA1C 6.9 (A) 01/20/2021   HGBA1C 6.9 (A) 08/29/2020   HGBA1C 6.7 (A) 04/28/2020   HGBA1C 6.2 (A) 08/21/2019   HGBA1C 6.5 (A) 03/26/2019   HGBA1C 7.2 (A) 11/24/2018   HGBA1C 7.2 (A) 07/14/2018   HGBA1C 7.7 (H) 03/06/2018   HGBA1C 7.8 11/25/2017   HGBA1C 7.0 08/20/2017   HGBA1C 8.8 05/10/2017   HGBA1C 9.2 (H) 02/27/2017   HGBA1C 9.0 (H) 07/06/2016   HGBA1C 9.2 (H) 04/02/2016   He is on: - Metformin ER 2000 mg with b'fast >> w/ dinner >> 1000 mg 2x a day - Farxiga 10 mg in a.m. - was w/o it x 3 weeks, restarted 1 mo ago -  Mounjaro 10 mg weekly - had GERD  He was previously on Tresiba 64 >> 40 units daily >> stopped ~02/2019 He was previously on Invokana. He was previously on Janumet but developed transaminitis. Previously on Lantus 65 units at bedtime.  He checks sugars >4x a day - with the Libre 3 CGM:  Previously:  Prev.:  He has hypoglycemia awareness in the 70s. Lowest: 50s (one night) >> 90s >> 62. Highest: 268 > upper 200s>> 200.  Glucometer: [name];  One Touch Ultra 2  Pt's meals are: - Breakfast: bisquits - Lunch: sandwich, burgers (may  skip) - Dinner: burgers, chicken, steak - Snacks: 0-1 He is still traveling a lot for work.  -No CKD, last BUN/creatinine:  Lab Results  Component Value Date   BUN 17 01/08/2023   BUN 20 12/10/2022   CREATININE 0.85 01/08/2023   CREATININE 0.83 12/10/2022   ACR normal: Lab Results  Component Value Date   MICRALBCREAT 1.5 02/19/2023   MICRALBCREAT 4.1 06/06/2021   MICRALBCREAT 0.6 03/26/2019   MICRALBCREAT 4.6 07/14/2018   MICRALBCREAT 4.1 02/27/2017   MICRALBCREAT 0.6 09/19/2015   MICRALBCREAT 0.7 07/26/2014   On lisinopril 2.5.  -+ HL.  Last set of lipids: Lab Results  Component Value Date   CHOL 215 (H) 12/04/2022   HDL 40 12/04/2022   LDLCALC 151 (H) 12/04/2022   LDLDIRECT 163.5 05/16/2012   TRIG 131 12/04/2022   CHOLHDL 5.4 (H) 12/04/2022  Crestor, Lipitor, fluvastatin XL caused joint and muscle aches.  Co-Q10 was not helping.  However, he then started on Crestor 5 mg daily and Zetia.  He had muscle cramps >> stopped Crestor, then restarted and increased to 20 mg.  He continues on Zetia 10 mg daily and Repatha now.  -  last eye exam was: 11/23/2022: No DR, prev.+DR  - + some numbness and tingling in feet.  Last foot exam: 09/20/2022 by Dr. Al Corpus  He had low back pain, and is now s/p back sx. >> no pain anymore but, some pain/cramping in his legs - plantar fasciitis - podiatrist gave him a steroid inj. Prev. On ALA, B complex.  In 11/2022, he was in the emergency room with palpitations-found to have ventricular bigeminy.  His wife and daughter moved here from [place] in 09/2019.  ROS: + see HPI  I reviewed pt's medications, allergies, PMH, social hx, family hx, and changes were documented in the history of present illness. Otherwise, unchanged from my initial visit note.  Past Medical History:  Diagnosis Date   Clotting disorder (HCC)    DVT   Diabetes mellitus    DVT (deep venous thrombosis) (HCC)    Family history of polyps in the colon    Fatty liver     GERD (gastroesophageal reflux disease)    History of chicken pox    Hyperlipidemia    no meds now 05-28-16   Obesity    Sleep apnea    not using CPAP at this time 05-28-16   Past Surgical History:  Procedure Laterality Date   COLONOSCOPY     2012, 2017   LEFT HEART CATH AND CORONARY ANGIOGRAPHY N/A 01/14/2023   Procedure: LEFT HEART CATH AND CORONARY ANGIOGRAPHY;  Surgeon: [place], Peter M, MD;  Location: [facility] INVASIVE CV LAB;  Service: Cardiovascular;  Laterality: N/A;   LUMBAR DISC SURGERY     L4/L5   PILONIDAL CYST EXCISION     2001   Social History   Social History   Marital status: Single    Spouse name: N/A   Number of children: 0   Social History Main Topics   Smoking status: Current Every Day Smoker    Packs/day: 1.00    Years: 20.00    Types: Cigarettes   Smokeless tobacco: Never Used   Alcohol use Yes     Comment: 10-15 per week, usually on the weekends   Drug use: No   Social History Narrative   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, travels for work   Engaged 2017   Current Outpatient Medications on File Prior to Visit  Medication Sig Dispense Refill   amLODipine (NORVASC) 2.5 MG tablet Take 1 tablet (2.5 mg total) by mouth daily. 90 tablet 3   aspirin EC 81 MG tablet Take 1 tablet (81 mg total) by mouth daily. Swallow whole. 90 tablet 3   Continuous Blood Gluc Sensor (FREESTYLE LIBRE 3 SENSOR) MISC 1 each by Does not apply route every 14 (fourteen) days. 6 each 3   dapagliflozin propanediol (FARXIGA) 10 MG TABS tablet Take 1 tablet (10 mg total) by mouth daily before breakfast. 90 tablet 1   Evolocumab (REPATHA SURECLICK) 140 MG/ML SOAJ Inject 140 mg into the skin every 14 (fourteen) days. 2 mL 12   ezetimibe (ZETIA) 10 MG tablet Take 1 tablet (10 mg total) by mouth daily. 90 tablet 3   famotidine (PEPCID) 20 MG tablet Take 1 tablet (20 mg total) by mouth 2 (two) times daily.     ibuprofen (ADVIL) 200 MG tablet Take 1-3 tablets (200-600 mg total) by  mouth every 8 (eight) hours as needed (with food).     lisinopril (ZESTRIL) 2.5 MG tablet TAKE ONE TABLET BY MOUTH ONE TIME DAILY 90 tablet 0   loratadine (CLARITIN) 10 MG tablet Take 1 tablet (10 mg total) by mouth daily. 30 tablet 12   metFORMIN (GLUCOPHAGE-XR) 500 MG 24 hr tablet Take 2  tablets (1,000 mg total) by mouth daily with breakfast.     metoprolol tartrate (LOPRESSOR) 25 MG tablet Take 1 tablet (25 mg total) by mouth 2 (two) times daily. 180 tablet 3   nitroGLYCERIN (NITROSTAT) 0.4 MG SL tablet Place 1 tablet (0.4 mg total) under the tongue every 5 (five) minutes as needed for chest pain. Max 3 doses in 15 minutes. (Patient not taking: Reported on 03/11/2023) 25 tablet 3   rosuvastatin (CRESTOR) 20 MG tablet Take 1 tablet (20 mg total) by mouth at bedtime. 90 tablet 3   Semaglutide, 1 MG/DOSE, 4 MG/3ML SOPN Inject 1 mg as directed once a week. 3 mL 0   tirzepatide (MOUNJARO) 10 MG/0.5ML Pen INJECT 10MG  INTO THE SKIN ONCE A WEEK 6 mL 1   No current facility-administered medications on file prior to visit.   Allergies  Allergen Reactions   Other Anaphylaxis    Bee stings   Chantix [Varenicline Tartrate]     irritable   Januvia [Sitagliptin Phosphate]     Inc in LFTs. Occurred with janumet, but prev tolerated metformin alone   Lipitor [Atorvastatin Calcium]     myalgia   Varenicline Tartrate     irritable   Family History  Problem Relation Age of Onset   Diabetes Mother    Stroke Mother    Diabetes Father    Prostate cancer Father        dx'd in his early 20s   Hyperlipidemia Brother    Colon cancer Paternal Uncle        dx'd near age 64   Diabetes Maternal Uncle    Esophageal cancer Neg Hx    Stomach cancer Neg Hx    Rectal cancer Neg Hx   Pt has FH of DM in Mother, father, M uncle.   PE: BP 120/78   Pulse 75   Ht 6' (1.829 m)   Wt 270 lb (122.5 kg)   SpO2 99%   BMI 36.62 kg/m  Wt Readings from Last 3 Encounters:  07/29/23 270 lb (122.5 kg)  03/11/23 275 lb  12.8 oz (125.1 kg)  02/19/23 282 lb (127.9 kg)   Constitutional: overweight, in NAD Eyes:  EOMI, no exophthalmos ENT: no neck masses, no cervical lymphadenopathy Cardiovascular: RRR, No MRG Respiratory: CTA B Musculoskeletal: no deformities Skin:no rashes Neurological: no tremor with outstretched hands  ASSESSMENT: 1. DM2, now noninsulin-dependent, uncontrolled, with complications - PN - DR  -He had a freestyle libre CGM in the past but this kept coming off -No personal history of pancreatitis or family history of medullary thyroid cancer  2. HL  3. Obesity class II  PLAN:  1. Patient with longstanding, uncontrolled, type 2 diabetes, on metformin, SGLT2 inhibitor and GLP1 or GLP-1/GIP receptor agonist, with worse control at last visit.  At that time, HbA1c was 7.3%, increased.  Sugars are almost entirely fluctuating within the target range but with occasional hyperglycemic spikes usually after breakfast.  At that time, he was off [facility] for a period of time and he just restarted it.  He was also off [place], which we restarted.  Since last visit, we discussed about trying to switch from [facility] (which was on backorder) to Ozempic 1 mg weekly was approved for him with a PA, but did not have to switch and he is currently Mounjaro.  He was off [place] for 3 weeks due to insurance coverage but he is now back on it for a month. CGM interpretation: -At today's visit, we reviewed  his CGM downloads: It appears that 92% of values are in target range (goal >70%), while 6% are higher than 180 (goal <25%), and 2% are lower than 70 (goal <4%).  The calculated average blood sugar is 123.  The projected HbA1c for the next 3 months (GMI) is 6.3%. -Reviewing the CGM trends, sugars appear to be improved, fluctuating almost entirely within the target range with only rare mild hyperglycemic values.  No need to change the regimen for now.  I printed him a prescription for Mounjaro to get a 60-month supply  from the pharmacy. - I suggested to:  Patient Instructions  Please continue: - Metformin ER 1000 mg 2x a day - Farxiga 10 mg before breakfast - Mounjaro 10 mg weekly  Please return in 4-6 months.  - we checked his HbA1c: 6.3% (lower) - advised to check sugars at different times of the day - 4x a day, rotating check times - advised for yearly eye exams >> he is UTD - he previously had some decrease sensation in his feet.  I recommended alpha-lipoic acid and B complex.  He started these but then came off.  He saw podiatry for plantar fasciitis.  He was given a steroid injection and was planning a brace.  He was planning to go back to orthopedics to see if his back pain could the culprit for his leg pain and muscle cramping.  We discussed that, especially in the setting of neuropathy, he may need neurology evaluation. - return to clinic in 4 to 6 months   2. HL -I reviewed his latest lipid panel from 11/2022: LDL still elevated, above our goal of less than 70, otherwise fractions at goal: Lab Results  Component Value Date   CHOL 215 (H) 12/04/2022   HDL 40 12/04/2022   LDLCALC 151 (H) 12/04/2022   LDLDIRECT 163.5 05/16/2012   TRIG 131 12/04/2022   CHOLHDL 5.4 (H) 12/04/2022  -He had joint pains from statins, including from fluvastatin XL.  He then started on Crestor 5 mg daily, which he tolerated fairly well, but then had to come off due to muscle cramps.  Muscle cramps continued even after stopping Crestor. -He is currently on Crestor 20 mg daily, Zetia 10 mg daily, and Repatha.  He continues to have muscle aches.  3.  Obesity class II -continue SGLT 2 inhibitor and GLP-1/GIP receptor agonist which should also help with weight loss.  We restarted Farxiga at last visit. -He gained 15 pounds before last visit -He lost 12 pounds since then  Carlus Pavlov, MD PhD [facility] Endocrinology

## 2023-08-11 NOTE — Progress Notes (Unsigned)
Cardiology Office Note    Date:  08/12/2023   ID:  Jerry Eaton, DOB 04-18-1971, MRN [id number]  PCP:  Joaquim Nam, MD  Cardiologist:  Nicki Guadalajara, MD   5 month F/U cardiology evaluation initially referred by Crawford Givens, MD for evaluation of chest pain  History of Present Illness:  Jerry Eaton is a 51 y.o. male who is followed by Dr. Para March for primary care.  I saw him for my initial evaluation on December 03, 2022 an last saw him on Mar 11, 2023.   Jerry Eaton has a history of diabetes mellitus for at least 10 years and most recently is on Farxiga and metformin.  He also is on low-dose lisinopril not for hypertension but for potential renal preservation.  He had recently traveled to [place] and had to climb over 270 steps.  He would experience some shortness of breath.  Recently, he has experienced some vague chest pressure in his neck and jaw and experienced symptoms on October 14, 2022.  He did not go to the emergency room.  He felt this discomfort did not feel like his previous GERD however his symptoms did improve with burping.  He was evaluated by Dr. Para March on November 23, 2022.  He has had several episodes of this lower sternal burning with his last episode approximately 1 week before his November 23, 2018 for evaluation.  He had stopped taking Mounjaro.  He has a history of hyperlipidemia and had been on rosuvastatin which he had stopped and is currently taking Zetia 10 mg daily. He tells me he was diagnosed with very mild sleep apnea approximately 15 years ago.  He does admit to snoring.  He believes his sleep is nonrestorative.  He cannot sleep on his back.  Epworth Sleepiness Scale score was calculated in the office today and this endorsed at an, consistent with excessive daytime sleepiness.    During his initial evaluation, I recommended that he undergo a 2D echo Doppler study to evaluate his recent symptomatology and shortness of breath.  With his chest  tightness I scheduled him for coronary CTA to assess both calcium score as well as potential luminal obstruction.  I also recommended he undergo a sleep study with a high likelihood for obstructive sleep apnea.  Coronary CTA done on December 11, 2022 showed significant increase calcium score at 716 representing 99th percentile.  There appeared to be 8 chronically occluded mid circumflex.  There was moderate plaque in the very distal LAD, proximal diagonal 1 and otherwise mild disease.  FFR analysis was 0.77 in the very distal LAD most likely due to vessel tapering and 0.69 and diagonal 1.  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  RCA had mild plaque with normal FFR.  A 2D echo Doppler study on January 01, 2023 showed a EF of 55 to 60% without wall motion abnormality with mild concentric LVH.  Apparently prior to my subsequent evaluation he had been seen by Valentina Shaggy, NP after he had developed a recurrent episode of pain radiating to his jaw.  At that time, she reviewed the CTA findings with Dr. [place] and on January 14, 2023, earlier this week had undergone definitive cardiac catheterization.  He was found to have two-vessel obstructive disease with borderline obstructive disease in the first diagonal vessel which was tortuous proximally.  The left circumflex had diffuse severe obstruction but was very small caliber and medical therapy was recommended.  When I saw  him in follow-up of his catheterization on January 17, 2023, I reviewed his angiographic findings and agreed with the recommendation for medical therapy.  During that evaluation, he was on lisinopril 2.5 mg and metoprolol to tartrate 25 mg twice a day for hypertension and CAD.  I recommended the addition of amlodipine at 2.5 mg daily and depending upon blood pressure response this could be further titrated to 5 mg.  His BP at that day was 116/80.  I also recommended he increase rosuvastatin to 20 mg for more aggressive lipid-lowering therapy  and restart Zetia with target LDL less than 55.  He is diabetic on Farxiga and metformin.  At his initial visit due to concerns for obstructive sleep apnea I had recommended sleep evaluation.  I last saw him on Mar 11, 2023 at which time he was doing well on medical therapy.   He denies any recent anginal symptomatology.  He quit smoking since his cardiac catheterization.  He continues to be on amlodipine 2.5 mg, lisinopril 2.5 mg, metoprolol tartrate 25 mg twice a day.  He is diabetic on metformin and Mounjaro.  He has recently been started on Repatha 140 mg every 2 weeks to take in addition to rosuvastatin 20 mg.  He had undergone a home sleep study on January 30, 2023 which showed mild overall obstructive sleep apnea with an AHI of 8.2/h.  However supine sleep was absent and I suspect the overall severity may have been under estimated if supine sleep was present.  He had moderate oxygen desaturation to nadir of 86%.  He snored for 54.3 minutes of 14.9% of his sleep.  During that evaluation we discussed that she should have AutoPap therapy.  Since I last saw him he has never been contacted regarding AutoPap.  He has continued to have mild additional weight loss and has lost approximately 10 pounds since his last visit.  His peak weight 4 years ago was 340 pounds had reduced to 270.  He denies any recurrent chest pain or significant shortness of breath.  At times he notes some mild dizziness if he stands abruptly.  He denies any awareness of palpitations.  He continues to be on amlodipine 2.5 mg, lisinopril 2.5 mg, metoprolol tartrate 25 mg twice a day for blood pressure control.  He is diabetic on Farxiga, metformin, in addition to [place].  With his significant hyperlipidemia he is on Zetia 10 mg, rosuvastatin 20 mg, in addition to Repatha.  In February 2013 prior to initiation of Repatha LDL cholesterol was 151.  He has not had recent laboratory.  He presents for evaluation.  Past Medical History:   Diagnosis Date   Clotting disorder (HCC)    DVT   Diabetes mellitus    DVT (deep venous thrombosis) (HCC)    Family history of polyps in the colon    Fatty liver    GERD (gastroesophageal reflux disease)    History of chicken pox    Hyperlipidemia    no meds now 05-28-16   Obesity    Sleep apnea    not using CPAP at this time 05-28-16    Past Surgical History:  Procedure Laterality Date   COLONOSCOPY     2012, 2017   LEFT HEART CATH AND CORONARY ANGIOGRAPHY N/A 01/14/2023   Procedure: LEFT HEART CATH AND CORONARY ANGIOGRAPHY;  Surgeon: Eaton, Peter M, MD;  Location: [facility] INVASIVE CV LAB;  Service: Cardiovascular;  Laterality: N/A;   LUMBAR DISC SURGERY     L4/L5  PILONIDAL CYST EXCISION     2001    Current Medications: Outpatient Medications Prior to Visit  Medication Sig Dispense Refill   amLODipine (NORVASC) 2.5 MG tablet Take 1 tablet (2.5 mg total) by mouth daily. 90 tablet 3   aspirin EC 81 MG tablet Take 1 tablet (81 mg total) by mouth daily. Swallow whole. 90 tablet 3   dapagliflozin propanediol (FARXIGA) 10 MG TABS tablet Take 1 tablet (10 mg total) by mouth daily before breakfast. 90 tablet 1   Evolocumab (REPATHA SURECLICK) 140 MG/ML SOAJ Inject 140 mg into the skin every 14 (fourteen) days. 2 mL 12   ezetimibe (ZETIA) 10 MG tablet Take 1 tablet (10 mg total) by mouth daily. 90 tablet 3   famotidine (PEPCID) 20 MG tablet Take 1 tablet (20 mg total) by mouth 2 (two) times daily.     ibuprofen (ADVIL) 200 MG tablet Take 1-3 tablets (200-600 mg total) by mouth every 8 (eight) hours as needed (with food).     loratadine (CLARITIN) 10 MG tablet Take 1 tablet (10 mg total) by mouth daily. 30 tablet 12   metFORMIN (GLUCOPHAGE-XR) 500 MG 24 hr tablet Take 2 tablets (1,000 mg total) by mouth daily with breakfast.     metoprolol tartrate (LOPRESSOR) 25 MG tablet Take 1 tablet (25 mg total) by mouth 2 (two) times daily. 180 tablet 3   nitroGLYCERIN (NITROSTAT) 0.4 MG SL tablet  Place 1 tablet (0.4 mg total) under the tongue every 5 (five) minutes as needed for chest pain. Max 3 doses in 15 minutes. 25 tablet 3   rosuvastatin (CRESTOR) 20 MG tablet Take 1 tablet (20 mg total) by mouth at bedtime. 90 tablet 3   Semaglutide, 1 MG/DOSE, 4 MG/3ML SOPN Inject 1 mg as directed once a week. 3 mL 0   tirzepatide (MOUNJARO) 10 MG/0.5ML Pen Inject 10 mg into the skin once a week. 6 mL 3   lisinopril (ZESTRIL) 2.5 MG tablet TAKE ONE TABLET BY MOUTH ONE TIME DAILY 90 tablet 0   Continuous Glucose Sensor (FREESTYLE LIBRE 3 SENSOR) MISC 1 each by Does not apply route every 14 (fourteen) days. 6 each 3   No facility-administered medications prior to visit.     Allergies:   Other, Chantix [varenicline tartrate], Januvia [sitagliptin phosphate], Lipitor [atorvastatin calcium], and Varenicline tartrate   Social History   Socioeconomic History   Marital status: Married    Spouse name: Not on file   Number of children: 0   Years of education: Not on file   Highest education level: Not on file  Occupational History   Occupation: Editor, commissioning  Tobacco Use   Smoking status: Former    Current packs/day: 1.00    Average packs/day: 1 pack/day for 20.0 years (20.0 ttl pk-yrs)    Types: Cigarettes   Smokeless tobacco: Never   Tobacco comments:    12/03/2022-Patient smokes a pack daily  Substance and Sexual Activity   Alcohol use: Yes    Comment: very rare   Drug use: No   Sexual activity: Yes  Other Topics Concern   Not on file  Social History Narrative   [employer] grad   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, travels for work   Married 2019   1 stepdaughter   Social Determinants of Health   Financial Resource Strain: Not on file  Food Insecurity: Not on file  Transportation Needs: Not on file  Physical Activity: Not on file  Stress: Not on file  Social Connections: Not on file    He was born in [place].  He attended [employer].  He has a degree from  [employer] and majored in zoology.  He is married for 4-1/2 years.  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  He has a 30-year history of tobacco use, 1 pack/day an fortunately quit at the time of his catheterization he does drink occasional beer and liquor.  He does not routinely exercise.  Family History:  The patient's family history includes Colon cancer in his paternal uncle; Diabetes in his father, maternal uncle, and mother; Hyperlipidemia in his brother; Prostate cancer in his father; Stroke in his mother.  His mother is 11 and has a history of stroke, atrial fibrillation and diabetes.  Father died at age 69 and had heart disease, cancer and diabetes.  He has a brother age 91 and a sister age 42.  ROS General: Negative; No fevers, chills, or night sweats;  HEENT: Negative; No changes in vision or hearing, sinus congestion, difficulty swallowing Pulmonary: Negative; No cough, wheezing, shortness of breath, hemoptysis Cardiovascular: See HPI GI: Negative; No nausea, vomiting, diarrhea, or abdominal pain GU: Negative; No dysuria, hematuria, or difficulty voiding Musculoskeletal: Negative; no myalgias, joint pain, or weakness Hematologic/Oncology: Negative; no easy bruising, bleeding Endocrine: Diabetes mellitus Neuro: Negative; no changes in balance, headaches Skin: Negative; No rashes or skin lesions Psychiatric: Negative; No behavioral problems, depression Sleep: Positive for snoring, daytime sleepiness, nonrestorative sleep, and difficulty sleeping on his back.negative; No bruxism, restless legs, hypnogognic hallucinations, no cataplexy   Epworth Sleepiness Scale: Situation   Chance of Dozing/Sleeping (0 = never , 1 = slight chance , 2 = moderate chance , 3 = high chance )   sitting and reading 2   watching TV 2   sitting inactive in a public place 1   being a passenger in a motor vehicle for an hour or more 3   lying down  in the afternoon 1   sitting and talking to someone 0   sitting quietly after lunch (no alcohol) 1   while stopped for a few minutes in traffic as the driver 0   Total Score  10    Other comprehensive 14 point system review is negative.   PHYSICAL EXAM:   VS:  BP 104/70 (BP Location: Left Arm, Patient Position: Sitting, Cuff Size: Normal)   Pulse 82   Ht 6' (1.829 m)   Wt 270 lb (122.5 kg)   SpO2 96%   BMI 36.62 kg/m     Repeat blood pressure by me was low at 90/68  Wt Readings from Last 3 Encounters:  08/12/23 270 lb (122.5 kg)  07/29/23 270 lb (122.5 kg)  03/11/23 275 lb 12.8 oz (125.1 kg)    General: Alert, oriented, no distress.  Skin: normal turgor, no rashes, warm and dry HEENT: Normocephalic, atraumatic. Pupils equal round and reactive to light; sclera anicteric; extraocular muscles intact;  Nose without nasal septal hypertrophy Mouth/Parynx benign; Mallinpatti scale 3 Neck: No JVD, no carotid bruits; normal carotid upstroke Lungs: clear to ausculatation and percussion; no wheezing or rales Chest wall: without tenderness to palpitation Heart: PMI not displaced, RRR, s1 s2 normal, 1/6 systolic murmur, no diastolic murmur, no rubs, gallops, thrills, or heaves Abdomen: Adiposity, soft, nontender; no hepatosplenomehaly, BS+; abdominal aorta nontender and not dilated by palpation. Back: no CVA tenderness Pulses 2+ Musculoskeletal: full range of motion, normal strength,  no joint deformities Extremities: no clubbing cyanosis or edema, Homan's sign negative  Neurologic: grossly nonfocal; Cranial nerves grossly wnl Psychologic: Normal mood and affect    Studies/Labs Reviewed:   EKG Interpretation Date/Time:  Monday August 12 2023 08:12:54 EDT Ventricular Rate:  77 PR Interval:  186 QRS Duration:  96 QT Interval:  378 QTC Calculation: 427 R Axis:   -15  Text Interpretation: Normal sinus rhythm Inferior infarct , age undetermined Possible Anterior infarct , age  undetermined When compared with ECG of 10-Dec-2022 09:58, PREVIOUS ECG IS PRESENT Confirmed by Nicki Guadalajara ([id number]) on 08/12/2023 8:39:00 AM    Mar 11, 2023 ECG (independently read by me): NSR at 75, Q III, aVF  January 17, 2023 ECG (independently read by me): NSR at 64, Q III, aVF, no ectopy  December 03, 2022 ECG (independently read by me): Sinus rhythm at 90, PAC, Left axis deviation, Inferior Q wave III, aVF; QTc 455 msec  Recent Labs:    Latest Ref Rng & Units 01/08/2023   10:26 AM 12/10/2022   10:20 AM 12/04/2022    8:22 AM  BMP  Glucose 70 - 99 mg/dL 102  725  366   BUN 6 - 24 mg/dL 17  20  19    Creatinine 0.76 - 1.27 mg/dL 4.40  3.47  4.25   BUN/Creat Ratio 9 - 20 20   23    Sodium 134 - 144 mmol/L 142  135  140   Potassium 3.5 - 5.2 mmol/L 5.2  4.6  4.6   Chloride 96 - 106 mmol/L 104  103  101   CO2 20 - 29 mmol/L 22  23  22    Calcium 8.7 - 10.2 mg/dL 9.7  9.2  9.3         Latest Ref Rng & Units 11/23/2022    9:58 AM 06/15/2022    3:04 PM 06/06/2021    8:32 AM  Hepatic Function  Total Protein 6.0 - 8.3 g/dL 7.1  6.8  8.0   Albumin 3.5 - 5.2 g/dL 4.4   4.6   AST 0 - 37 U/L 16  17  18    ALT 0 - 53 U/L 21  23  22    Alk Phosphatase 39 - 117 U/L 59   70   Total Bilirubin 0.2 - 1.2 mg/dL 0.7  0.5  0.4        Latest Ref Rng & Units 01/08/2023   10:26 AM 12/10/2022   10:20 AM 11/23/2022    9:58 AM  CBC  WBC 3.4 - 10.8 x10E3/uL 6.3  6.8  5.8   Hemoglobin 13.0 - 17.7 g/dL 95.6  38.7  56.4   Hematocrit 37.5 - 51.0 % 45.6  44.3  44.6   Platelets 150 - 450 x10E3/uL 293  303  278.0    Lab Results  Component Value Date   MCV 85 01/08/2023   MCV 85.9 12/10/2022   MCV 85.9 11/23/2022   Lab Results  Component Value Date   TSH 0.88 11/23/2022   Lab Results  Component Value Date   HGBA1C 6.4 (A) 07/29/2023   HGBA1C . 07/29/2023     BNP No results found for: "BNP"  ProBNP No results found for: "PROBNP"   Lipid Panel     Component Value Date/Time   CHOL 215 (H)  12/04/2022 0822   TRIG 131 12/04/2022 0822   HDL 40 12/04/2022 0822   CHOLHDL 5.4 (H) 12/04/2022 0822   CHOLHDL 4.5 06/15/2022 1504  VLDL 22.8 10/06/2021 0820   LDLCALC 151 (H) 12/04/2022 0822   LDLCALC 90 06/15/2022 1504   LDLDIRECT 163.5 05/16/2012 0812   LABVLDL 24 12/04/2022 0822     RADIOLOGY: No results found.   Additional studies/ records that were reviewed today include:  I reviewed the records of Dr. Crawford Givens.  CARDIAC CATH: 01/14/2023   1st Diag lesion is 70% stenosed.   Prox LAD to Dist LAD lesion is 20% stenosed.   Ramus lesion is 25% stenosed with 25% stenosed side branch in Lat Ramus.   Prox Cx to Dist Cx lesion is 95% stenosed.   Prox RCA to Mid RCA lesion is 30% stenosed.   The left ventricular systolic function is normal.   LV end diastolic pressure is normal.   The left ventricular ejection fraction is 55-65% by visual estimate.   2 vessel obstructive CAD. There is borderline obstructive disease in the first diagonal which is tortuous proximally. The LCx is a small caliber vessel with diffuse severe obstruction Normal LV function Normal LV EDP   Plan: recommend aggressive risk factor modification and antianginal therapy. The LCx is poorly suited for PCI given small diameter and diffuse disease.        ASSESSMENT:    1. Coronary artery disease of native artery of native heart with stable angina pectoris (HCC)   2. Hyperlipidemia with target LDL less than 70   3. OSA (obstructive sleep apnea)   4. Tobacco use: 30 year history, quit March 2024   5. Type 2 diabetes mellitus without complication, without long-term current use of insulin (HCC)   6. Class 2 severe obesity due to excess calories with serious comorbidity and body mass index (BMI) of 37.0 to 37.9 in adult [facility])      PLAN:  Jerry Eaton is a 52 year old gentleman who has a 10-year history of diabetes mellitus, 30-year history of prior tobacco use, history of GERD, and  recently has experienced episodes of lower chest discomfort radiating to his back and also experiencing some lower jaw discomfort.  His symptoms do improve somewhat with burping.  He has been on Farxiga 10 mg and metformin 1000 mg twice a day for hyperlipidemia.  Remotely, he had been on rosuvastatin 5 mg but stopped therapy.  Laboratory in June 15, 2022 showed total cholesterol 158, HDL 35, triglycerides were significantly elevated at 237 and LDL cholesterol was 90.  I suspect he most likely had increased LDL particle number with probable small LDL particles if NMR LipoProfile analysis was performed.  He had traveled recently to [place] and did hike and climb a significant number of steps to his temple.  He had recently developed chest pain.  An echo Doppler study revealed normal systolic function with EF 55 to 60% with mild LVH.  There was very mild dilation of aortic root at 37 mm.  Coronary CTA revealed an elevated calcium score at 716 representing 99th percentile.  At cardiac catheterization he was found to have two-vessel obstructive disease with severe stenosis in a small caliber mid distal circumflex vessel.  There is also generalized 70% stenosis in a tortuous first diagonal vessel.  At his subsequent office visit I added amlodipine to his current regimen.  Fortunately, he quit tobacco use following his catheterization.  Last seen by me in May 2024 he was chest pain-free on his regimen of low-dose amlodipine 2.5 mg, lisinopril 2.5 mg and metoprolol 25 mg twice a day.  He was recently started on Repatha  injections to take in addition to rosuvastatin 20 mg.  He just had his third dose of Repatha.  Target LDL is less than 55.  He is now on Mounjaro in addition to metformin for diabetes mellitus.  Jerry Eaton blood pressure today is low and on recheck by me was 90/60.  I am recommending he discontinue lisinopril and at present he will continue low-dose amlodipine 2.5 mg and metoprolol tartrate 25 mg twice a  day for his underlying CAD.  He never had his follow-up laboratory done as requested.  I am checking fasting laboratory today including a comprehensive metabolic panel, CBC, TSH, and fasting lipid panel.  Previously we had checked his LP(a) which was normal at 26.2.  He never received his AutoPap therapy since his sleep study.  Unfortunately at the time this was potentially ordered, our sleep coordinator had retired and we had to wait 2 months for the next sleep coordinator to start.  Presently, we will order a new ResMed AirSense 11 CPAP auto unit with initial pressures as discussed in his sleep study 718 cm of water.  I will need to see him in 3 to 4 months for follow-up evaluation and further recommendation will be at that time.   Medication Adjustments/Labs and Tests Ordered: Current medicines are reviewed at length with the patient today.  Concerns regarding medicines are outlined above.  Medication changes, Labs and Tests ordered today are listed in the Patient Instructions below. Patient Instructions  STOP LISINOPRIL *If you need a refill on your cardiac medications before your next appointment, please call your pharmacy*   Lab Work: LIPID, CBC, CMET,  If you have labs (blood work) drawn today and your tests are completely normal, you will receive your results only by: MyChart Message (if you have MyChart) OR A paper copy in the mail If you have any lab test that is abnormal or we need to change your treatment, we will call you to review the results.   Testing/Procedures: NONE ORDERED DURING TODAY'S ENCOUNTER   Follow-Up: At [facility], you and your health needs are our priority.  As part of our continuing mission to provide you with exceptional heart care, we have created designated Provider Care Teams.  These Care Teams include your primary Cardiologist (physician) and Advanced Practice Providers (APPs -  Physician Assistants and Nurse Practitioners) who all work together  to provide you with the care you need, when you need it.  We recommend signing up for the patient portal called "MyChart".  Sign up information is provided on this After Visit Summary.  MyChart is used to connect with patients for Virtual Visits (Telemedicine).  Patients are able to view lab/test results, encounter notes, upcoming appointments, etc.  Non-urgent messages can be sent to your provider as well.   To learn more about what you can do with MyChart, go to [email or web address].    Your next appointment:   4 month(s) You will return for your sleep compliance visit after you have used your c-pap machine for 90 days  Provider:   Nicki Guadalajara, MD       Signed, Nicki Guadalajara, MD, [facility], ABSM Diplomate, [employer] Sleep [employer]  08/12/2023 6:54 PM    [facility] [address], [place], [place]  [address] Phone: [phone]

## 2023-08-12 ENCOUNTER — Encounter: Payer: Self-pay | Admitting: Cardiovascular Disease

## 2023-08-12 ENCOUNTER — Ambulatory Visit: Payer: BC Managed Care – PPO | Attending: Cardiovascular Disease | Admitting: Cardiovascular Disease

## 2023-08-12 VITALS — BP 104/70 | HR 82 | Ht 72.0 in | Wt 270.0 lb

## 2023-08-12 DIAGNOSIS — G4733 Obstructive sleep apnea (adult) (pediatric): Secondary | ICD-10-CM

## 2023-08-12 DIAGNOSIS — Z6837 Body mass index (BMI) 37.0-37.9, adult: Secondary | ICD-10-CM

## 2023-08-12 DIAGNOSIS — E785 Hyperlipidemia, unspecified: Secondary | ICD-10-CM | POA: Diagnosis not present

## 2023-08-12 DIAGNOSIS — E119 Type 2 diabetes mellitus without complications: Secondary | ICD-10-CM

## 2023-08-12 DIAGNOSIS — Z720 Tobacco use: Secondary | ICD-10-CM | POA: Diagnosis not present

## 2023-08-12 DIAGNOSIS — I25118 Atherosclerotic heart disease of native coronary artery with other forms of angina pectoris: Secondary | ICD-10-CM

## 2023-08-12 DIAGNOSIS — E66812 Obesity, class 2: Secondary | ICD-10-CM

## 2023-08-12 NOTE — Patient Instructions (Signed)
STOP LISINOPRIL *If you need a refill on your cardiac medications before your next appointment, please call your pharmacy*   Lab Work: LIPID, CBC, CMET,  If you have labs (blood work) drawn today and your tests are completely normal, you will receive your results only by: MyChart Message (if you have MyChart) OR A paper copy in the mail If you have any lab test that is abnormal or we need to change your treatment, we will call you to review the results.   Testing/Procedures: NONE ORDERED DURING TODAY'S ENCOUNTER   Follow-Up: At [facility], you and your health needs are our priority.  As part of our continuing mission to provide you with exceptional heart care, we have created designated Provider Care Teams.  These Care Teams include your primary Cardiologist (physician) and Advanced Practice Providers (APPs -  Physician Assistants and Nurse Practitioners) who all work together to provide you with the care you need, when you need it.  We recommend signing up for the patient portal called "MyChart".  Sign up information is provided on this After Visit Summary.  MyChart is used to connect with patients for Virtual Visits (Telemedicine).  Patients are able to view lab/test results, encounter notes, upcoming appointments, etc.  Non-urgent messages can be sent to your provider as well.   To learn more about what you can do with MyChart, go to [email or web address].    Your next appointment:   4 month(s) You will return for your sleep compliance visit after you have used your c-pap machine for 90 days  Provider:   Nicki Guadalajara, MD

## 2023-08-12 NOTE — Progress Notes (Signed)
CPAP order sent to [facility] 08/12/23

## 2023-08-13 LAB — COMPREHENSIVE METABOLIC PANEL
ALT: 18 [IU]/L (ref 0–44)
AST: 16 [IU]/L (ref 0–40)
Albumin: 4.6 g/dL (ref 4.1–5.1)
Alkaline Phosphatase: 72 [IU]/L (ref 44–121)
BUN/Creatinine Ratio: 27 — ABNORMAL HIGH (ref 9–20)
BUN: 18 mg/dL (ref 6–24)
Bilirubin Total: 0.3 mg/dL (ref 0.0–1.2)
CO2: 24 mmol/L (ref 20–29)
Calcium: 9.7 mg/dL (ref 8.7–10.2)
Chloride: 105 mmol/L (ref 96–106)
Creatinine, Ser: 0.67 mg/dL — ABNORMAL LOW (ref 0.76–1.27)
Globulin, Total: 2.4 g/dL (ref 1.5–4.5)
Glucose: 115 mg/dL — ABNORMAL HIGH (ref 70–99)
Potassium: 5.3 mmol/L — ABNORMAL HIGH (ref 3.5–5.2)
Sodium: 140 mmol/L (ref 134–144)
Total Protein: 7 g/dL (ref 6.0–8.5)
eGFR: 114 mL/min/{1.73_m2} (ref >59)

## 2023-08-13 LAB — CBC
Hematocrit: 45.6 % (ref 37.5–51.0)
Hemoglobin: 14.6 g/dL (ref 13.0–17.7)
MCH: 27.7 pg (ref 26.6–33.0)
MCHC: 32 g/dL (ref 31.5–35.7)
MCV: 87 fL (ref 79–97)
Platelets: 307 10*3/uL (ref 150–450)
RBC: 5.27 x10E6/uL (ref 4.14–5.80)
RDW: 13.1 % (ref 11.6–15.4)
WBC: 6.7 10*3/uL (ref 3.4–10.8)

## 2023-08-13 LAB — LIPID PANEL
Chol/HDL Ratio: 2 ratio (ref 0.0–5.0)
Cholesterol, Total: 73 mg/dL — ABNORMAL LOW (ref 100–199)
HDL: 37 mg/dL — ABNORMAL LOW (ref >39)
LDL Chol Calc (NIH): 16 mg/dL (ref 0–99)
Triglycerides: 105 mg/dL (ref 0–149)
VLDL Cholesterol Cal: 20 mg/dL (ref 5–40)

## 2023-08-15 ENCOUNTER — Other Ambulatory Visit: Payer: Self-pay

## 2023-08-15 DIAGNOSIS — E1140 Type 2 diabetes mellitus with diabetic neuropathy, unspecified: Secondary | ICD-10-CM

## 2023-08-15 MED ORDER — FREESTYLE LIBRE 3 PLUS SENSOR MISC: 1.0000 | 3 refills | Status: DC

## 2023-08-15 NOTE — Telephone Encounter (Signed)
Patient called and left a vm needing a script for Libre 3 Plus.

## 2023-08-23 DIAGNOSIS — D485 Neoplasm of uncertain behavior of skin: Secondary | ICD-10-CM | POA: Diagnosis not present

## 2023-08-23 DIAGNOSIS — L988 Other specified disorders of the skin and subcutaneous tissue: Secondary | ICD-10-CM | POA: Diagnosis not present

## 2023-08-23 DIAGNOSIS — L905 Scar conditions and fibrosis of skin: Secondary | ICD-10-CM | POA: Diagnosis not present

## 2023-08-23 DIAGNOSIS — L720 Epidermal cyst: Secondary | ICD-10-CM | POA: Diagnosis not present

## 2023-08-26 DIAGNOSIS — G4733 Obstructive sleep apnea (adult) (pediatric): Secondary | ICD-10-CM | POA: Diagnosis not present

## 2023-09-24 ENCOUNTER — Other Ambulatory Visit (HOSPITAL_COMMUNITY): Payer: Self-pay

## 2023-09-25 DIAGNOSIS — G4733 Obstructive sleep apnea (adult) (pediatric): Secondary | ICD-10-CM | POA: Diagnosis not present

## 2023-10-03 ENCOUNTER — Other Ambulatory Visit: Payer: Self-pay | Admitting: Internal Medicine

## 2023-10-03 ENCOUNTER — Encounter: Payer: Self-pay | Admitting: Cardiovascular Disease

## 2023-10-03 ENCOUNTER — Ambulatory Visit: Payer: BC Managed Care – PPO | Attending: Cardiovascular Disease | Admitting: Cardiovascular Disease

## 2023-10-03 VITALS — BP 114/80 | HR 74 | Ht 72.0 in | Wt 269.8 lb

## 2023-10-03 DIAGNOSIS — E66812 Obesity, class 2: Secondary | ICD-10-CM

## 2023-10-03 DIAGNOSIS — Z720 Tobacco use: Secondary | ICD-10-CM | POA: Diagnosis not present

## 2023-10-03 DIAGNOSIS — G4733 Obstructive sleep apnea (adult) (pediatric): Secondary | ICD-10-CM | POA: Diagnosis not present

## 2023-10-03 DIAGNOSIS — Z6836 Body mass index (BMI) 36.0-36.9, adult: Secondary | ICD-10-CM

## 2023-10-03 DIAGNOSIS — E785 Hyperlipidemia, unspecified: Secondary | ICD-10-CM

## 2023-10-03 DIAGNOSIS — I25118 Atherosclerotic heart disease of native coronary artery with other forms of angina pectoris: Secondary | ICD-10-CM | POA: Diagnosis not present

## 2023-10-03 DIAGNOSIS — E119 Type 2 diabetes mellitus without complications: Secondary | ICD-10-CM

## 2023-10-03 NOTE — Progress Notes (Signed)
Cardiology Office Note    Date:  10/05/2023   ID:  Jerry Eaton, DOB 09-14-1971, MRN [id number]  PCP:  Joaquim Nam, MD  Cardiologist:  Nicki Guadalajara, MD   2 month F/U cardiology/sleep evaluation initially referred by Crawford Givens, MD for evaluation of chest pain  History of Present Illness:  Jerry Eaton is a 52 y.o. male who is followed by Dr. Para March for primary care.  I saw him for my initial evaluation on December 03, 2022 an last saw him on August 12, 2023.   Jerry Eaton has a history of diabetes mellitus for at least 10 years and most recently is on Farxiga and metformin.  He also is on low-dose lisinopril not for hypertension but for potential renal preservation.  He had recently traveled to [place] and had to climb over 270 steps.  He would experience some shortness of breath.  Recently, he has experienced some vague chest pressure in his neck and jaw and experienced symptoms on October 14, 2022.  He did not go to the emergency room.  He felt this discomfort did not feel like his previous GERD however his symptoms did improve with burping.  He was evaluated by Dr. Para March on November 23, 2022.  He has had several episodes of this lower sternal burning with his last episode approximately 1 week before his November 23, 2018 for evaluation.  He had stopped taking Mounjaro.  He has a history of hyperlipidemia and had been on rosuvastatin which he had stopped and is currently taking Zetia 10 mg daily. He tells me he was diagnosed with very mild sleep apnea approximately 15 years ago.  He does admit to snoring.  He believes his sleep is nonrestorative.  He cannot sleep on his back.  Epworth Sleepiness Scale score was calculated in the office today and this endorsed at an, consistent with excessive daytime sleepiness.    During his initial evaluation, I recommended that he undergo a 2D echo Doppler study to evaluate his recent symptomatology and shortness of breath.  With his  chest tightness I scheduled him for coronary CTA to assess both calcium score as well as potential luminal obstruction.  I also recommended he undergo a sleep study with a high likelihood for obstructive sleep apnea.  Coronary CTA done on December 11, 2022 showed significant increase calcium score at 716 representing 99th percentile.  There appeared to be 8 chronically occluded mid circumflex.  There was moderate plaque in the very distal LAD, proximal diagonal 1 and otherwise mild disease.  FFR analysis was 0.77 in the very distal LAD most likely due to vessel tapering and 0.69 and diagonal 1.  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  RCA had mild plaque with normal FFR.  A 2D echo Doppler study on January 01, 2023 showed a EF of 55 to 60% without wall motion abnormality with mild concentric LVH.  Apparently prior to my subsequent evaluation he had been seen by Valentina Shaggy, NP after he had developed a recurrent episode of pain radiating to his jaw.  At that time, she reviewed the CTA findings with Dr. [place] and on January 14, 2023, earlier this week had undergone definitive cardiac catheterization.  He was found to have two-vessel obstructive disease with borderline obstructive disease in the first diagonal vessel which was tortuous proximally.  The left circumflex had diffuse severe obstruction but was very small caliber and medical therapy was recommended.  When I saw  him in follow-up of his catheterization on January 17, 2023, I reviewed his angiographic findings and agreed with the recommendation for medical therapy.  During that evaluation, he was on lisinopril 2.5 mg and metoprolol to tartrate 25 mg twice a day for hypertension and CAD.  I recommended the addition of amlodipine at 2.5 mg daily and depending upon blood pressure response this could be further titrated to 5 mg.  His BP at that day was 116/80.  I also recommended he increase rosuvastatin to 20 mg for more aggressive lipid-lowering  therapy and restart Zetia with target LDL less than 55.  He is diabetic on Farxiga and metformin.  At his initial visit due to concerns for obstructive sleep apnea I had recommended sleep evaluation.  I saw him on Mar 11, 2023 at which time he was doing well on medical therapy.   He denied any recent anginal symptomatology.  He quit smoking since his cardiac catheterization.  He continues to be on amlodipine 2.5 mg, lisinopril 2.5 mg, metoprolol tartrate 25 mg twice a day.  He is diabetic on metformin and Mounjaro.  He has recently been started on Repatha 140 mg every 2 weeks to take in addition to rosuvastatin 20 mg.  He had undergone a home sleep study on January 30, 2023 which showed mild overall obstructive sleep apnea with an AHI of 8.2/h.  However supine sleep was absent and I suspect the overall severity may have been under estimated if supine sleep was present.  He had moderate oxygen desaturation to nadir of 86%.  He snored for 54.3 minutes of 14.9% of his sleep.  During that evaluation we discussed that she should have AutoPap therapy.  I last saw him on August 12, 2023. Since I last saw him he has never been contacted regarding AutoPap.  He has continued to have mild additional weight loss and has lost approximately 10 pounds since his last visit.  His peak weight 4 years ago was 340 pounds had reduced to 270.  He denies any recurrent chest pain or significant shortness of breath.  At times he notes some mild dizziness if he stands abruptly.  He denies any awareness of palpitations.  He continues to be on amlodipine 2.5 mg, lisinopril 2.5 mg, metoprolol tartrate 25 mg twice a day for blood pressure control.  He is diabetic on Farxiga, metformin, in addition to [name].  With his significant hyperlipidemia he is on Zetia 10 mg, rosuvastatin 20 mg, in addition to Repatha.  In February 2013 prior to initiation of Repatha LDL cholesterol was 151.  I recommended he undergo fasting laboratory and try to  expedite initiation of CPAP therapy.  He received a new ResMed AirSense 11 AutoSet CPAP unit on August 26, 2023 with [facility] as his DME.  Compliance data from November 10 through September 30, 2023 verifies compliance with 87% of usage days and average use of 6 hours 30 minutes.  His CPAP set at a pressure range of 7 to 18 cm and AHI is excellent at 0.5.  He typically goes to bed at 11 PM and wakes up at 5:30 AM.  Laboratory, August 12, 2023 showed creatinine 0.67.  Potassium was minimally elevated at 5.3.  Lipids reveal total cholesterol 73, triglycerides 105, HDL 37 and LDL was outstanding at 16.  Hemoglobin A1c on October 7 was 6.4.  He presents for evaluation.  Past Medical History:  Diagnosis Date   Clotting disorder (HCC)    DVT   Diabetes  mellitus    DVT (deep venous thrombosis) (HCC)    Family history of polyps in the colon    Fatty liver    GERD (gastroesophageal reflux disease)    History of chicken pox    Hyperlipidemia    no meds now 05-28-16   Obesity    Sleep apnea    not using CPAP at this time 05-28-16    Past Surgical History:  Procedure Laterality Date   COLONOSCOPY     2012, 2017   LEFT HEART CATH AND CORONARY ANGIOGRAPHY N/A 01/14/2023   Procedure: LEFT HEART CATH AND CORONARY ANGIOGRAPHY;  Surgeon: Swaziland, Peter M, MD;  Location: [facility] INVASIVE CV LAB;  Service: Cardiovascular;  Laterality: N/A;   LUMBAR DISC SURGERY     L4/L5   PILONIDAL CYST EXCISION     2001    Current Medications: Outpatient Medications Prior to Visit  Medication Sig Dispense Refill   amLODipine (NORVASC) 2.5 MG tablet Take 1 tablet (2.5 mg total) by mouth daily. 90 tablet 3   aspirin EC 81 MG tablet Take 1 tablet (81 mg total) by mouth daily. Swallow whole. 90 tablet 3   Continuous Glucose Sensor (FREESTYLE LIBRE 3 PLUS SENSOR) MISC Inject 1 Device into the skin continuous. Change every 15 days 6 each 3   Continuous Glucose Sensor (FREESTYLE LIBRE 3 SENSOR) MISC 1 each by Does not apply  route every 14 (fourteen) days. 6 each 3   Evolocumab (REPATHA SURECLICK) 140 MG/ML SOAJ Inject 140 mg into the skin every 14 (fourteen) days. 2 mL 12   ezetimibe (ZETIA) 10 MG tablet Take 1 tablet (10 mg total) by mouth daily. 90 tablet 3   famotidine (PEPCID) 20 MG tablet Take 1 tablet (20 mg total) by mouth 2 (two) times daily.     ibuprofen (ADVIL) 200 MG tablet Take 1-3 tablets (200-600 mg total) by mouth every 8 (eight) hours as needed (with food).     loratadine (CLARITIN) 10 MG tablet Take 1 tablet (10 mg total) by mouth daily. 30 tablet 12   metFORMIN (GLUCOPHAGE-XR) 500 MG 24 hr tablet Take 2 tablets (1,000 mg total) by mouth daily with breakfast.     metoprolol tartrate (LOPRESSOR) 25 MG tablet Take 1 tablet (25 mg total) by mouth 2 (two) times daily. 180 tablet 3   nitroGLYCERIN (NITROSTAT) 0.4 MG SL tablet Place 1 tablet (0.4 mg total) under the tongue every 5 (five) minutes as needed for chest pain. Max 3 doses in 15 minutes. 25 tablet 3   rosuvastatin (CRESTOR) 20 MG tablet Take 1 tablet (20 mg total) by mouth at bedtime. 90 tablet 3   Semaglutide, 1 MG/DOSE, 4 MG/3ML SOPN Inject 1 mg as directed once a week. 3 mL 0   tirzepatide (MOUNJARO) 10 MG/0.5ML Pen Inject 10 mg into the skin once a week. 6 mL 3   dapagliflozin propanediol (FARXIGA) 10 MG TABS tablet Take 1 tablet (10 mg total) by mouth daily before breakfast. 90 tablet 1   No facility-administered medications prior to visit.     Allergies:   Other, Chantix [varenicline tartrate], Januvia [sitagliptin phosphate], Lipitor [atorvastatin calcium], and Varenicline tartrate   Social History   Socioeconomic History   Marital status: Married    Spouse name: Not on file   Number of children: 0   Years of education: Not on file   Highest education level: Not on file  Occupational History   Occupation: Editor, commissioning  Tobacco Use   Smoking  status: Former    Current packs/day: 1.00    Average packs/day: 1 pack/day  for 20.0 years (20.0 ttl pk-yrs)    Types: Cigarettes   Smokeless tobacco: Never   Tobacco comments:    12/03/2022-Patient smokes a pack daily  Substance and Sexual Activity   Alcohol use: Yes    Comment: very rare   Drug use: No   Sexual activity: Yes  Other Topics Concern   Not on file  Social History Narrative   [employer] grad   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, travels for work   Married 2019   1 stepdaughter   Social Drivers of Corporate investment banker Strain: Not on file  Food Insecurity: Not on file  Transportation Needs: Not on file  Physical Activity: Not on file  Stress: Not on file  Social Connections: Not on file    He was born in [place].  He attended [employer].  He has a degree from [employer] and majored in zoology.  He is married for 4-1/2 years.  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  He has a 30-year history of tobacco use, 1 pack/day an fortunately quit at the time of his catheterization he does drink occasional beer and liquor.  He does not routinely exercise.  Family History:  The patient's family history includes Colon cancer in his paternal uncle; Diabetes in his father, maternal uncle, and mother; Hyperlipidemia in his brother; Prostate cancer in his father; Stroke in his mother.  His mother is 73 and has a history of stroke, atrial fibrillation and diabetes.  Father died at age 88 and had heart disease, cancer and diabetes.  He has a brother age 59 and a sister age 70.  ROS General: Negative; No fevers, chills, or night sweats;  HEENT: Negative; No changes in vision or hearing, sinus congestion, difficulty swallowing Pulmonary: Negative; No cough, wheezing, shortness of breath, hemoptysis Cardiovascular: See HPI GI: Negative; No nausea, vomiting, diarrhea, or abdominal pain GU: Negative; No dysuria, hematuria, or difficulty voiding Musculoskeletal: Negative; no myalgias, joint  pain, or weakness Hematologic/Oncology: Negative; no easy bruising, bleeding Endocrine: Diabetes mellitus Neuro: Negative; no changes in balance, headaches Skin: Negative; No rashes or skin lesions Psychiatric: Negative; No behavioral problems, depression Sleep:  OSA with previous positive for snoring, daytime sleepiness, nonrestorative sleep, and difficulty sleeping on his back.No bruxism, restless legs, hypnogognic hallucinations, no cataplexy   Epworth Sleepiness Scale: Situation   Chance of Dozing/Sleeping (0 = never , 1 = slight chance , 2 = moderate chance , 3 = high chance )   sitting and reading 2 > 1   watching TV 2    1   sitting inactive in a public place 1    1   being a passenger in a motor vehicle for an hour or more 3    2   lying down in the afternoon 1    2   sitting and talking to someone 0    0   sitting quietly after lunch (no alcohol) 1    1   while stopped for a few minutes in traffic as the driver 0    0   Total Score  10  >> 8    Other comprehensive 14 point system review is negative.   PHYSICAL EXAM:   VS:  BP 114/80 (BP Location: Left Arm, Patient Position: Sitting)   Pulse 74   Ht  6' (1.829 m)   Wt 269 lb 12.8 oz (122.4 kg)   SpO2 96%   BMI 36.59 kg/m     Repeat blood pressure by me was low at 100/78  Wt Readings from Last 3 Encounters:  10/03/23 269 lb 12.8 oz (122.4 kg)  08/12/23 270 lb (122.5 kg)  07/29/23 270 lb (122.5 kg)    General: Alert, oriented, no distress.  Skin: normal turgor, no rashes, warm and dry HEENT: Normocephalic, atraumatic. Pupils equal round and reactive to light; sclera anicteric; extraocular muscles intact;  Nose without nasal septal hypertrophy Mouth/Parynx benign; Mallinpatti scale 3 Neck: No JVD, no carotid bruits; normal carotid upstroke Lungs: clear to ausculatation and percussion; no wheezing or rales Chest wall: without tenderness to palpitation Heart: PMI not displaced, RRR, s1 s2 normal, 1/6 systolic murmur,  no diastolic murmur, no rubs, gallops, thrills, or heaves Abdomen: soft, nontender; no hepatosplenomehaly, BS+; abdominal aorta nontender and not dilated by palpation. Back: no CVA tenderness Pulses 2+ Musculoskeletal: full range of motion, normal strength, no joint deformities Extremities: no clubbing cyanosis or edema, Homan's sign negative  Neurologic: grossly nonfocal; Cranial nerves grossly wnl Psychologic: Normal mood and affect    Studies/Labs Reviewed:   EKG Interpretation Date/Time:  Thursday October 03 2023 08:20:24 EST Ventricular Rate:  74 PR Interval:  180 QRS Duration:  100 QT Interval:  392 QTC Calculation: 435 R Axis:   -29  Text Interpretation: Normal sinus rhythm Inferior infarct (cited on or before 12-Aug-2023) Cannot rule out Anterior infarct (cited on or before 12-Aug-2023) When compared with ECG of 12-Aug-2023 08:12, No significant change was found Confirmed by Nicki Guadalajara ([id number]) on 10/05/2023 10:46:51 AM    August 12, 2023 ECG (independently read by me): Normal sinus rhythm at 77, Q-wave in lead III and aVF  Mar 11, 2023 ECG (independently read by me): NSR at 75, Q III, aVF  January 17, 2023 ECG (independently read by me): NSR at 64, Q III, aVF, no ectopy  December 03, 2022 ECG (independently read by me): Sinus rhythm at 90, PAC, Left axis deviation, Inferior Q wave III, aVF; QTc 455 msec  Recent Labs:    Latest Ref Rng & Units 08/12/2023    8:55 AM 01/08/2023   10:26 AM 12/10/2022   10:20 AM  BMP  Glucose 70 - 99 mg/dL 604  540  981   BUN 6 - 24 mg/dL 18  17  20    Creatinine 0.76 - 1.27 mg/dL 1.91  4.78  2.95   BUN/Creat Ratio 9 - 20 27  20     Sodium 134 - 144 mmol/L 140  142  135   Potassium 3.5 - 5.2 mmol/L 5.3  5.2  4.6   Chloride 96 - 106 mmol/L 105  104  103   CO2 20 - 29 mmol/L 24  22  23    Calcium 8.7 - 10.2 mg/dL 9.7  9.7  9.2         Latest Ref Rng & Units 08/12/2023    8:55 AM 11/23/2022    9:58 AM 06/15/2022    3:04 PM  Hepatic  Function  Total Protein 6.0 - 8.5 g/dL 7.0  7.1  6.8   Albumin 4.1 - 5.1 g/dL 4.6  4.4    AST 0 - 40 IU/L 16  16  17    ALT 0 - 44 IU/L 18  21  23    Alk Phosphatase 44 - 121 IU/L 72  59    Total Bilirubin 0.0 -  1.2 mg/dL 0.3  0.7  0.5        Latest Ref Rng & Units 08/12/2023    8:55 AM 01/08/2023   10:26 AM 12/10/2022   10:20 AM  CBC  WBC 3.4 - 10.8 x10E3/uL 6.7  6.3  6.8   Hemoglobin 13.0 - 17.7 g/dL 86.5  78.4  69.6   Hematocrit 37.5 - 51.0 % 45.6  45.6  44.3   Platelets 150 - 450 x10E3/uL 307  293  303    Lab Results  Component Value Date   MCV 87 08/12/2023   MCV 85 01/08/2023   MCV 85.9 12/10/2022   Lab Results  Component Value Date   TSH 0.88 11/23/2022   Lab Results  Component Value Date   HGBA1C 6.4 (A) 07/29/2023   HGBA1C . 07/29/2023     BNP No results found for: "BNP"  ProBNP No results found for: "PROBNP"   Lipid Panel     Component Value Date/Time   CHOL 73 (L) 08/12/2023 0855   TRIG 105 08/12/2023 0855   HDL 37 (L) 08/12/2023 0855   CHOLHDL 2.0 08/12/2023 0855   CHOLHDL 4.5 06/15/2022 1504   VLDL 22.8 10/06/2021 0820   LDLCALC 16 08/12/2023 0855   LDLCALC 90 06/15/2022 1504   LDLDIRECT 163.5 05/16/2012 0812   LABVLDL 20 08/12/2023 0855     RADIOLOGY: No results found.   Additional studies/ records that were reviewed today include:  I reviewed the records of Dr. Crawford Givens.  CARDIAC CATH: 01/14/2023   1st Diag lesion is 70% stenosed.   Prox LAD to Dist LAD lesion is 20% stenosed.   Ramus lesion is 25% stenosed with 25% stenosed side branch in Lat Ramus.   Prox Cx to Dist Cx lesion is 95% stenosed.   Prox RCA to Mid RCA lesion is 30% stenosed.   The left ventricular systolic function is normal.   LV end diastolic pressure is normal.   The left ventricular ejection fraction is 55-65% by visual estimate.   2 vessel obstructive CAD. There is borderline obstructive disease in the first diagonal which is tortuous proximally. The LCx  is a small caliber vessel with diffuse severe obstruction Normal LV function Normal LV EDP   Plan: recommend aggressive risk factor modification and antianginal therapy. The LCx is poorly suited for PCI given small diameter and diffuse disease.      ASSESSMENT:    1. Coronary artery disease of native artery of native heart with stable angina pectoris (HCC)   2. OSA (obstructive sleep apnea)   3. Hyperlipidemia with target LDL less than 55   4. Tobacco use: 30 year history, quit March 2024   5. Type 2 diabetes mellitus without complication, without long-term current use of insulin (HCC)   6. Class 2 severe obesity due to excess calories with serious comorbidity and body mass index (BMI) of 36.0 to 36.9 in adult [facility])     PLAN:  Jerry Eaton is a 52 year old gentleman who has a 10-year history of diabetes mellitus, 30-year history of prior tobacco use, history of GERD, and recently has experienced episodes of lower chest discomfort radiating to his back and also experiencing some lower jaw discomfort.  His symptoms do improve somewhat with burping.  He has been on Farxiga 10 mg and metformin 1000 mg twice a day for hyperlipidemia.  Remotely, he had been on rosuvastatin 5 mg but stopped therapy.  Laboratory in June 15, 2022 showed total cholesterol 158, HDL  35, triglycerides were significantly elevated at 237 and LDL cholesterol was 90.  I suspect he most likely had increased LDL particle number with probable small LDL particles if NMR LipoProfile analysis was performed.  He had traveled to [place] and did hike and climb a significant number of steps to his temple.  He had recently developed chest pain.  An echo Doppler study revealed normal systolic function with EF 55 to 60% with mild LVH.  There was very mild dilation of aortic root at 37 mm.  Coronary CTA revealed an elevated calcium score at 716 representing 99th percentile.  At cardiac catheterization he was found to have  two-vessel obstructive disease with severe stenosis in a small caliber mid distal circumflex vessel.  There is also generalized 70% stenosis in a tortuous first diagonal vessel.  At his subsequent office visit I added amlodipine to his current regimen.  Fortunately, he quit tobacco use following his catheterization.  Last seen by me in May 2024 he was chest pain-free on his regimen of low-dose amlodipine 2.5 mg, lisinopril 2.5 mg and metoprolol 25 mg twice a day.  He was recently started on Repatha injections to take in addition to rosuvastatin 20 mg.  He just had his third dose of Repatha.  Target LDL is less than 55.  He is now on Mounjaro in addition to metformin for diabetes mellitus.  When last evaluated by me in October 2024, blood pressure was low and I recommended discontinuance of lisinopril but to continue low-dose amlodipine 2.5 mg and metoprolol to tartrate 25 mg twice a day for his CAD.  Subsequent laboratory stable with creatinine 0.67.  Potassium was minimally increased at 5.3.  Lipid studies were excellent with total cholesterol 73, triglycerides 105, HDL was low at 37 but LDL was 16.  LP(a) is normal at 26.2.  He received a new ResMed AirSense 11 AutoSet mended on November 4 for his obstructive sleep apnea.  He is meeting compliance standards with his download from August 31, 1929 04/30/2023 showing 87% of usage days.  Average use was 6 hours 30 minutes.  I stressed the importance of trying to sleep with CPAP a minimum of 7 hours and preferably 8 if at all possible.  AHI is excellent at 0.5 with his pressure range at 718 cm.  95th percentile pressure is 10.0 with maximum average pressure of 11.3.  Clinically he is doing well.  Blood pressure today is stable off lisinopril.  He continues to be on low-dose amlodipine at 2.5 mg, metoprolol tartrate 25 mg twice a day.  He is on [place], and metformin for his diabetes mellitus and potential weight loss.  He is on Zetia, rosuvastatin and Repatha  for aggressive lipid management.  I will see him in 6 months for reevaluation or sooner as needed.   Medication Adjustments/Labs and Tests Ordered: Current medicines are reviewed at length with the patient today.  Concerns regarding medicines are outlined above.  Medication changes, Labs and Tests ordered today are listed in the Patient Instructions below. Patient Instructions  Medication Instructions:  The current medical regimen is effective;  continue present plan and medications as directed. Please refer to the Current Medication list given to you today.   *If you need a refill on your cardiac medications before your next appointment, please call your pharmacy*   Lab Work: No labs were ordered during today's visit.  If you have labs (blood work) drawn today and your tests are completely normal, you will receive  your results only by: MyChart Message (if you have MyChart) OR A paper copy in the mail If you have any lab test that is abnormal or we need to change your treatment, we will call you to review the results.   Testing/Procedures: No procedures ordered today.    Follow-Up: At [facility], you and your health needs are our priority.  As part of our continuing mission to provide you with exceptional heart care, we have created designated Provider Care Teams.  These Care Teams include your primary Cardiologist (physician) and Advanced Practice Providers (APPs -  Physician Assistants and Nurse Practitioners) who all work together to provide you with the care you need, when you need it.  We recommend signing up for the patient portal called "MyChart".  Sign up information is provided on this After Visit Summary.  MyChart is used to connect with patients for Virtual Visits (Telemedicine).  Patients are able to view lab/test results, encounter notes, upcoming appointments, etc.  Non-urgent messages can be sent to your provider as well.   To learn more about what you can do with  MyChart, go to [email or web address].    Your next appointment:   5 month(s)  Provider:   Nicki Guadalajara, MD     Other Instructions If you have any questions or concerns regarding your c-pap, bi-pap or sleep accessories, please contact Brandie Rorie at [phone].       Signed, Nicki Guadalajara, MD, [facility], ABSM Diplomate, [employer] Sleep Medicine  10/05/2023 10:51 AM    [facility] [address], [place], [place]  [address] Phone: [phone]

## 2023-10-03 NOTE — Patient Instructions (Signed)
Medication Instructions:  The current medical regimen is effective;  continue present plan and medications as directed. Please refer to the Current Medication list given to you today.   *If you need a refill on your cardiac medications before your next appointment, please call your pharmacy*   Lab Work: No labs were ordered during today's visit.  If you have labs (blood work) drawn today and your tests are completely normal, you will receive your results only by: MyChart Message (if you have MyChart) OR A paper copy in the mail If you have any lab test that is abnormal or we need to change your treatment, we will call you to review the results.   Testing/Procedures: No procedures ordered today.    Follow-Up: At [facility], you and your health needs are our priority.  As part of our continuing mission to provide you with exceptional heart care, we have created designated Provider Care Teams.  These Care Teams include your primary Cardiologist (physician) and Advanced Practice Providers (APPs -  Physician Assistants and Nurse Practitioners) who all work together to provide you with the care you need, when you need it.  We recommend signing up for the patient portal called "MyChart".  Sign up information is provided on this After Visit Summary.  MyChart is used to connect with patients for Virtual Visits (Telemedicine).  Patients are able to view lab/test results, encounter notes, upcoming appointments, etc.  Non-urgent messages can be sent to your provider as well.   To learn more about what you can do with MyChart, go to [email or web address].    Your next appointment:   5 month(s)  Provider:   Nicki Guadalajara, MD     Other Instructions If you have any questions or concerns regarding your c-pap, bi-pap or sleep accessories, please contact Brandie Rorie at [phone].

## 2023-10-05 ENCOUNTER — Encounter: Payer: Self-pay | Admitting: Cardiovascular Disease

## 2023-10-08 ENCOUNTER — Ambulatory Visit: Payer: BC Managed Care – PPO | Admitting: Family Medicine

## 2023-10-08 DIAGNOSIS — G4733 Obstructive sleep apnea (adult) (pediatric): Secondary | ICD-10-CM | POA: Diagnosis not present

## 2023-10-08 DIAGNOSIS — I10 Essential (primary) hypertension: Secondary | ICD-10-CM | POA: Diagnosis not present

## 2023-10-11 ENCOUNTER — Ambulatory Visit (INDEPENDENT_AMBULATORY_CARE_PROVIDER_SITE_OTHER): Payer: BC Managed Care – PPO | Admitting: Family Medicine

## 2023-10-11 ENCOUNTER — Encounter: Payer: Self-pay | Admitting: Family Medicine

## 2023-10-11 VITALS — BP 110/82 | HR 82 | Temp 99.0°F | Ht 72.0 in | Wt 274.6 lb

## 2023-10-11 DIAGNOSIS — I25118 Atherosclerotic heart disease of native coronary artery with other forms of angina pectoris: Secondary | ICD-10-CM

## 2023-10-11 DIAGNOSIS — Z794 Long term (current) use of insulin: Secondary | ICD-10-CM

## 2023-10-11 DIAGNOSIS — E1140 Type 2 diabetes mellitus with diabetic neuropathy, unspecified: Secondary | ICD-10-CM

## 2023-10-11 DIAGNOSIS — K5900 Constipation, unspecified: Secondary | ICD-10-CM | POA: Diagnosis not present

## 2023-10-11 NOTE — Progress Notes (Unsigned)
He turned 52 today and I wished him happy birthday.  He is able to tolerate crestor.  He has some aches that predate statin use.    He is off lisinopril.  D/w pt about use/indication.  BP was prev lower on use and not elevated today.  He is still taking Farxiga at baseline and should have renal protection from that.  He had endocrine follow up.  Last A1c 6.4.    Recent constipation d/w pt.  Single event.  Took a suppository and then was able to move his bowels.  Took miralax 3 days and inc fiber.  That helped in the meantime.   Prior to that event, he had noted need for inc in fiber.    Meds, vitals, and allergies reviewed.   ROS: Per HPI unless specifically indicated in ROS section   GEN: nad, alert and oriented HEENT: ncat NECK: supple w/o LA CV: rrr PULM: ctab, no inc wob ABD: soft, +bs EXT: no edema SKIN: Well-perfused

## 2023-10-11 NOTE — Patient Instructions (Addendum)
I would try increasing fiber.  If you still have trouble, you could use miralax daily if needed and also could cut back to 1 metformin a day. Update me as needed.   Shingles shot may be cheaper at the pharmacy.   Take care.  Glad to see you. I would stay off lisinopril for now.

## 2023-10-13 DIAGNOSIS — K5900 Constipation, unspecified: Secondary | ICD-10-CM | POA: Insufficient documentation

## 2023-10-13 NOTE — Assessment & Plan Note (Signed)
He had an indication for lisinopril use but it looks like his blood pressure did not tolerate this and cardiology recommended stopping lisinopril.  I think it makes sense to stay off lisinopril for now.

## 2023-10-13 NOTE — Assessment & Plan Note (Signed)
I would try increasing fiber.  If still having trouble, he could use miralax daily if needed and also could cut back to 1 metformin a day. Update me as needed.

## 2023-10-13 NOTE — Assessment & Plan Note (Signed)
See above regarding potentially changing to a lower dose of metformin if constipation continues.

## 2023-10-26 DIAGNOSIS — G4733 Obstructive sleep apnea (adult) (pediatric): Secondary | ICD-10-CM | POA: Diagnosis not present

## 2023-10-28 ENCOUNTER — Other Ambulatory Visit: Payer: Self-pay | Admitting: Internal Medicine

## 2023-11-01 DIAGNOSIS — H53002 Unspecified amblyopia, left eye: Secondary | ICD-10-CM | POA: Diagnosis not present

## 2023-11-01 DIAGNOSIS — E119 Type 2 diabetes mellitus without complications: Secondary | ICD-10-CM | POA: Diagnosis not present

## 2023-11-01 DIAGNOSIS — H5203 Hypermetropia, bilateral: Secondary | ICD-10-CM | POA: Diagnosis not present

## 2023-11-01 LAB — HM DIABETES EYE EXAM

## 2023-11-26 DIAGNOSIS — G4733 Obstructive sleep apnea (adult) (pediatric): Secondary | ICD-10-CM | POA: Diagnosis not present

## 2023-12-08 ENCOUNTER — Other Ambulatory Visit: Payer: Self-pay | Admitting: Cardiology

## 2023-12-11 ENCOUNTER — Other Ambulatory Visit: Payer: Self-pay

## 2023-12-11 MED ORDER — METOPROLOL TARTRATE 25 MG PO TABS: 25.0000 mg | ORAL_TABLET | Freq: Two times a day (BID) | ORAL | 2 refills | Status: AC

## 2023-12-13 ENCOUNTER — Ambulatory Visit: Payer: BC Managed Care – PPO | Admitting: Cardiovascular Disease

## 2023-12-24 DIAGNOSIS — G4733 Obstructive sleep apnea (adult) (pediatric): Secondary | ICD-10-CM | POA: Diagnosis not present

## 2023-12-29 ENCOUNTER — Other Ambulatory Visit: Payer: Self-pay | Admitting: Internal Medicine

## 2023-12-29 ENCOUNTER — Other Ambulatory Visit: Payer: Self-pay | Admitting: Cardiovascular Disease

## 2023-12-31 DIAGNOSIS — I10 Essential (primary) hypertension: Secondary | ICD-10-CM | POA: Diagnosis not present

## 2023-12-31 DIAGNOSIS — G4733 Obstructive sleep apnea (adult) (pediatric): Secondary | ICD-10-CM | POA: Diagnosis not present

## 2024-01-10 DIAGNOSIS — D225 Melanocytic nevi of trunk: Secondary | ICD-10-CM | POA: Diagnosis not present

## 2024-01-10 DIAGNOSIS — Z1283 Encounter for screening for malignant neoplasm of skin: Secondary | ICD-10-CM | POA: Diagnosis not present

## 2024-01-27 ENCOUNTER — Encounter: Payer: Self-pay | Admitting: Internal Medicine

## 2024-01-27 ENCOUNTER — Ambulatory Visit (INDEPENDENT_AMBULATORY_CARE_PROVIDER_SITE_OTHER): Payer: BC Managed Care – PPO | Admitting: Internal Medicine

## 2024-01-27 VITALS — BP 124/70 | HR 70 | Ht 72.0 in | Wt 272.8 lb

## 2024-01-27 DIAGNOSIS — E66812 Obesity, class 2: Secondary | ICD-10-CM | POA: Diagnosis not present

## 2024-01-27 DIAGNOSIS — E785 Hyperlipidemia, unspecified: Secondary | ICD-10-CM

## 2024-01-27 DIAGNOSIS — E1140 Type 2 diabetes mellitus with diabetic neuropathy, unspecified: Secondary | ICD-10-CM

## 2024-01-27 DIAGNOSIS — Z794 Long term (current) use of insulin: Secondary | ICD-10-CM

## 2024-01-27 LAB — POCT GLYCOSYLATED HEMOGLOBIN (HGB A1C): Hemoglobin A1C: 6.5 % — AB (ref 4.0–5.6)

## 2024-01-27 LAB — MICROALBUMIN / CREATININE URINE RATIO
Creatinine, Urine: 72 mg/dL (ref 20–320)
Microalb Creat Ratio: 6 mg/g{creat} (ref <30)
Microalb, Ur: 0.4 mg/dL

## 2024-01-27 MED ORDER — EMPAGLIFLOZIN 25 MG PO TABS: 25.0000 mg | ORAL_TABLET | Freq: Every day | ORAL | 3 refills | Status: DC

## 2024-01-27 NOTE — Patient Instructions (Addendum)
 Please stop: - Metformin ER   Continue: - Farxiga 10 mg before breakfast - Mounjaro 10 mg weekly  Please return in 4-6 months.

## 2024-01-27 NOTE — Progress Notes (Signed)
 Patient ID: Jerry Eaton, male   DOB: 06-09-1971, 53 y.o.   MRN: [id number]   HPI: Jerry Eaton is a 53 y.o.-year-old male, returning for follow-up for DM2, dx in ~2013, insulin-dependent since 2016-2017,  now off insulin, fairly well controlled, with complications (CAD, DR, PN). Last visit 6 months ago.  Interim history: No increased urination, blurry vision, nausea.  He continues to have muscle cramps and plantar fasciitis.  To find a possible culprit, he has been off Crestor, [place], [place], but the cramps persisted.  Now on co-Q10 which helps some. He has occasional exertional chest pain, and also GERD.  Also has constipation. He has been having many low blood sugars per his latest sensor, but he checked them with his glucometer and they were 40 mg/dL lower than fingersticks.  Reviewed HbA1c levels: Lab Results  Component Value Date   HGBA1C 6.4 (A) 07/29/2023   HGBA1C . 07/29/2023   HGBA1C 7.3 (A) 02/19/2023   HGBA1C 6.5 11/23/2022   HGBA1C 6.4 (A) 08/17/2022   HGBA1C 6.6 (A) 02/09/2022   HGBA1C 6.6 (A) 10/06/2021   HGBA1C 7.5 (A) 06/06/2021   HGBA1C 6.9 (A) 01/20/2021   HGBA1C 6.9 (A) 08/29/2020   HGBA1C 6.7 (A) 04/28/2020   HGBA1C 6.2 (A) 08/21/2019   HGBA1C 6.5 (A) 03/26/2019   HGBA1C 7.2 (A) 11/24/2018   HGBA1C 7.2 (A) 07/14/2018   HGBA1C 7.7 (H) 03/06/2018   HGBA1C 7.8 11/25/2017   HGBA1C 7.0 08/20/2017   HGBA1C 8.8 05/10/2017   HGBA1C 9.2 (H) 02/27/2017   He is on: - Metformin ER 2000 mg with b'fast >> w/ dinner >> 1000 mg 2x a day >> 500 mg daily in am - Farxiga 10 mg in a.m. - was w/o it x 3 weeks, restarted 1 mo ago -  Mounjaro 10 mg weekly -initially GERD  He was previously on Tresiba 64 >> 40 units daily >> stopped ~02/2019 He was previously on Invokana. He was previously on Janumet but developed transaminitis. Previously on Lantus 65 units at bedtime.  He checks sugars >4x a day - with the Libre 3 CGM:  Previously:  Previously:   He has  hypoglycemia awareness in the 70s. Lowest: 50s >> 90s >> 62 >> 80s. Highest: 268 > upper 200s >> 200 >> low 200s.  Glucometer: [name];  One Touch Ultra 2  Pt's meals are: - Breakfast: bisquits - Lunch: sandwich, burgers (may skip) - Dinner: burgers, chicken, steak - Snacks: 0-1  -No CKD, last BUN/creatinine:  Lab Results  Component Value Date   BUN 18 08/12/2023   BUN 17 01/08/2023   CREATININE 0.67 (L) 08/12/2023   CREATININE 0.85 01/08/2023   ACR normal: Lab Results  Component Value Date   MICRALBCREAT 1.5 02/19/2023   MICRALBCREAT 4.1 06/06/2021   MICRALBCREAT 0.6 03/26/2019   MICRALBCREAT 4.6 07/14/2018   MICRALBCREAT 4.1 02/27/2017   MICRALBCREAT 0.6 09/19/2015   MICRALBCREAT 0.7 07/26/2014   On lisinopril 2.5.  -+ HL.  Last set of lipids: Lab Results  Component Value Date   CHOL 73 (L) 08/12/2023   HDL 37 (L) 08/12/2023   LDLCALC 16 08/12/2023   LDLDIRECT 163.5 05/16/2012   TRIG 105 08/12/2023   CHOLHDL 2.0 08/12/2023  Crestor, Lipitor, fluvastatin XL caused joint and muscle aches.  Co-Q10 - restarted.  However, he then started on Crestor 5 mg daily and Zetia.  He had muscle cramps >> stopped Crestor, then restarted and increased to 20 mg.  He continues on  Zetia 10 mg daily and Repatha now.   -  last eye exam was: 11/01/2023: No DR, prev.+DR. He sees Dr. Burgess Estelle.  - + some numbness and tingling in feet.  Last foot exam: 07/29/2023 here in the office.  He previously saw Dr. Al Corpus. He had low back pain, and is now s/p back sx. >> no pain anymore but, some pain/cramping in his legs - plantar fasciitis - podiatrist gave him a steroid inj. Prev. On ALA, B complex.  In 11/2022, he was in the emergency room with palpitations-found to have ventricular bigeminy.  His wife and daughter moved here from [place] in 09/2019.  ROS: + see HPI  I reviewed pt's medications, allergies, PMH, social hx, family hx, and changes were documented in the history of present  illness. Otherwise, unchanged from my initial visit note.  Past Medical History:  Diagnosis Date   Clotting disorder (HCC)    DVT   Diabetes mellitus    DVT (deep venous thrombosis) (HCC)    Family history of polyps in the colon    Fatty liver    GERD (gastroesophageal reflux disease)    History of chicken pox    Hyperlipidemia    no meds now 05-28-16   Obesity    Sleep apnea    not using CPAP at this time 05-28-16   Past Surgical History:  Procedure Laterality Date   COLONOSCOPY     2012, 2017   LEFT HEART CATH AND CORONARY ANGIOGRAPHY N/A 01/14/2023   Procedure: LEFT HEART CATH AND CORONARY ANGIOGRAPHY;  Surgeon: [place], Peter M, MD;  Location: [facility] INVASIVE CV LAB;  Service: Cardiovascular;  Laterality: N/A;   LUMBAR DISC SURGERY     L4/L5   PILONIDAL CYST EXCISION     2001   Social History   Socioeconomic History   Marital status: Married    Spouse name: Not on file   Number of children: 0   Years of education: Not on file   Highest education level: Not on file  Occupational History   Occupation: Editor, commissioning  Tobacco Use   Smoking status: Former    Current packs/day: 1.00    Average packs/day: 1 pack/day for 20.0 years (20.0 ttl pk-yrs)    Types: Cigarettes   Smokeless tobacco: Never   Tobacco comments:    12/03/2022-Patient smokes a pack daily  Substance and Sexual Activity   Alcohol use: Yes    Comment: very rare   Drug use: No   Sexual activity: Yes  Other Topics Concern   Not on file  Social History Narrative   [employer] grad   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, travels for work   Married 2019   1 stepdaughter   Social Drivers of Corporate investment banker Strain: Not on file  Food Insecurity: Not on file  Transportation Needs: Not on file  Physical Activity: Not on file  Stress: Not on file  Social Connections: Not on file  Intimate Partner Violence: Not on file    Current Outpatient Medications on File Prior to Visit  Medication  Sig Dispense Refill   amLODipine (NORVASC) 2.5 MG tablet TAKE ONE TABLET BY MOUTH ONE TIME DAILY 90 tablet 0   aspirin EC 81 MG tablet Take 1 tablet (81 mg total) by mouth daily. Swallow whole. 90 tablet 3   Continuous Glucose Sensor (FREESTYLE LIBRE 3 PLUS SENSOR) MISC Inject 1 Device into the skin continuous. Change every 15 days 6 each 3   Continuous  Glucose Sensor (FREESTYLE LIBRE 3 SENSOR) MISC 1 each by Does not apply route every 14 (fourteen) days. 6 each 3   dapagliflozin propanediol (FARXIGA) 10 MG TABS tablet TAKE ONE TABLET BY MOUTH ONCE A DAY BEFORE BREAKFAST 90 tablet 0   Evolocumab (REPATHA SURECLICK) 140 MG/ML SOAJ Inject 140 mg into the skin every 14 (fourteen) days. 2 mL 12   ezetimibe (ZETIA) 10 MG tablet Take 1 tablet (10 mg total) by mouth daily. 90 tablet 3   famotidine (PEPCID) 20 MG tablet Take 1 tablet (20 mg total) by mouth 2 (two) times daily.     ibuprofen (ADVIL) 200 MG tablet Take 1-3 tablets (200-600 mg total) by mouth every 8 (eight) hours as needed (with food).     loratadine (CLARITIN) 10 MG tablet Take 1 tablet (10 mg total) by mouth daily. 30 tablet 12   metFORMIN (GLUCOPHAGE-XR) 500 MG 24 hr tablet Take 2 tablets (1,000 mg total) by mouth daily with breakfast.     metoprolol tartrate (LOPRESSOR) 25 MG tablet Take 1 tablet (25 mg total) by mouth 2 (two) times daily. 180 tablet 2   nitroGLYCERIN (NITROSTAT) 0.4 MG SL tablet Place 1 tablet (0.4 mg total) under the tongue every 5 (five) minutes as needed for chest pain. Max 3 doses in 15 minutes. 25 tablet 3   rosuvastatin (CRESTOR) 20 MG tablet Take 1 tablet (20 mg total) by mouth at bedtime. 90 tablet 3   tirzepatide (MOUNJARO) 10 MG/0.5ML Pen INJECT 0.5ML(10MG ) INTO THE SKIN ONCE A WEEK 6 mL 3   No current facility-administered medications on file prior to visit.   Allergies  Allergen Reactions   Other Anaphylaxis    Bee stings   Chantix [Varenicline Tartrate]     irritable   Januvia [Sitagliptin Phosphate]      Inc in LFTs. Occurred with janumet, but prev tolerated metformin alone   Lipitor [Atorvastatin Calcium]     myalgia   Family History  Problem Relation Age of Onset   Diabetes Mother    Stroke Mother    Diabetes Father    Prostate cancer Father        dx'd in his early 78s   Hyperlipidemia Brother    Colon cancer Paternal Uncle        dx'd near age 5   Diabetes Maternal Uncle    Esophageal cancer Neg Hx    Stomach cancer Neg Hx    Rectal cancer Neg Hx   Pt has FH of DM in Mother, father, M uncle.   PE: BP 124/70   Pulse 70   Ht 6' (1.829 m)   Wt 272 lb 12.8 oz (123.7 kg)   SpO2 97%   BMI 37.00 kg/m  Wt Readings from Last 10 Encounters:  01/27/24 272 lb 12.8 oz (123.7 kg)  10/11/23 274 lb 9.6 oz (124.6 kg)  10/03/23 269 lb 12.8 oz (122.4 kg)  08/12/23 270 lb (122.5 kg)  07/29/23 270 lb (122.5 kg)  03/11/23 275 lb 12.8 oz (125.1 kg)  02/19/23 282 lb (127.9 kg)  02/07/23 279 lb (126.6 kg)  01/30/23 290 lb (131.5 kg)  01/17/23 288 lb 3.2 oz (130.7 kg)   Constitutional: overweight, in NAD Eyes:  EOMI, no exophthalmos ENT: no neck masses, no cervical lymphadenopathy Cardiovascular: RRR, No MRG Respiratory: CTA B Musculoskeletal: no deformities Skin:no rashes Neurological: no tremor with outstretched hands  ASSESSMENT: 1. DM2, now noninsulin-dependent, fairly well controlled, with complications - CAD - PN - DR  -He  had a freestyle libre CGM in the past but this kept coming off -No personal history of pancreatitis or family history of medullary thyroid cancer  2. HL  3. Obesity class II  PLAN:  1. Patient with longstanding, uncontrolled, type 2 diabetes, on metformin, SGLT2 inhibitor and GLP-1/GIP receptor agonist, with improved control.  At last visit, HbA1c was 6.4%.  Sugars were improved, fluctuating almost entirely above the target range with only rare mild hyperglycemic values.  We did not change his regimen at that time. CGM interpretation: -At  today's visit, we reviewed his CGM downloads: It appears that 76% of values are in target range (goal >70%), while 0% are higher than 180 (goal <25%), and 24% are lower than 70 (goal <4%).  The calculated average blood sugar is 90.  The projected HbA1c for the next 3 months (GMI) is 5.5%. -Reviewing the CGM trends, sugars appear to be excellent, but with many lows at all times of the day except for after breakfast.  Upon questioning, he mostly had low blood sugars with the latest sensor, but when he checked the values with his glucometer, these were not real lows, with a glucometer values being 40 mg/dL about the sensor values.  Indeed, reviewing previous sensors data, since last visit, he just appears to have had 1-2 defective sensors.  The sugars appear to be well-controlled when other sensor tracings were analyzed. - At today's visit we discussed about stopping metformin.  He already decreased the dose since last visit. - He was interested in coming off [place] due to price.  However, I recommended to stay on this due to cardiovascular and renal benefits.  He can also help with weight loss.  I sent a prescription for Jardiance to his pharmacy as this appears to be preferred by his pharmacy.  He just got [place] now with a coupon and I advised him to try to get Jardiance after he finishes his supply. -Regarding Mounjaro, he feels it is resistant to this.  We discussed about trying to adjust diet and lose weight, especially as he would not want to increase his Mounjaro dose due to constipation.  He felt that Ozempic worked better for him from the point of view of weight loss, but he had more severe GERD with this and would not want to switch back.  For now, he would like to continue with the same dose of Mounjaro. - I suggested to:  Patient Instructions  Please stop: - Metformin ER   Continue: - Farxiga 10 mg before breakfast - Mounjaro 10 mg weekly  Please return in 4-6 months.  - we checked his  HbA1c: 6.5% (slightly higher) - advised to check sugars at different times of the day - 4x a day, rotating check times - advised for yearly eye exams >> he is UTD - he previously had some decrease sensation in his feet.  I recommended alpha-lipoic acid and B complex.  He started these but then came off.  He saw podiatry for plantar fasciitis.  He was given a steroid injection and was planning a brace.  He was planning to go back to orthopedics to see if his back pain could the culprit for his leg pain and muscle cramping.  We discussed that, especially in the setting of neuropathy, he may need neurology evaluation.  Currently on co-Q10 with some improvement in pain. - return to clinic in 4-6 months   2. HL - Latest lipid panel showed an LDL 10 times lower than  baseline, slightly low HDL, triglycerides at goal: Lab Results  Component Value Date   CHOL 73 (L) 08/12/2023   HDL 37 (L) 08/12/2023   LDLCALC 16 08/12/2023   LDLDIRECT 163.5 05/16/2012   TRIG 105 08/12/2023   CHOLHDL 2.0 08/12/2023  -He had joint pains from statins, including from fluvastatin XL.  He then started on Crestor 5 mg daily, which he tolerated fairly well, but then had to come off due to muscle cramps.  Muscle cramps continued even after stopping Crestor. - He is currently on Crestor 20 mg daily, Zetia, 10 mg daily, and Repatha.  He continues to have some muscle aches.  He is managed by cardiology.  3.  Obesity class II - Will continue Mounjaro and [place] which should both help with weight loss - He lost 12 pounds before last visit - He gained 2 pounds since then  Carlus Pavlov, MD PhD [facility] Endocr[facility]

## 2024-02-04 ENCOUNTER — Telehealth: Payer: Self-pay | Admitting: Pharmacy Technician

## 2024-02-04 ENCOUNTER — Other Ambulatory Visit (HOSPITAL_COMMUNITY): Payer: Self-pay

## 2024-02-04 NOTE — Telephone Encounter (Signed)
 Pharmacy Patient Advocate Encounter   Received notification from CoverMyMeds that prior authorization for Jardiance 25MG  tablets is required/requested.   Insurance verification completed.   The patient is insured through [employer] .   Per test claim: PA required and submitted KEY/EOC/Request #: [id number] APPROVED from 01/05/2024 to 02/03/2025. Ran test claim, Copay is $59.10. This test claim was processed through [facility]- copay amounts may vary at other pharmacies due to pharmacy/plan contracts, or as the patient moves through the different stages of their insurance plan.   **insurance will only cover a 30 day supply at a time.**

## 2024-02-23 DIAGNOSIS — G4733 Obstructive sleep apnea (adult) (pediatric): Secondary | ICD-10-CM | POA: Diagnosis not present

## 2024-03-03 ENCOUNTER — Other Ambulatory Visit: Payer: Self-pay | Admitting: Cardiovascular Disease

## 2024-03-03 DIAGNOSIS — E119 Type 2 diabetes mellitus without complications: Secondary | ICD-10-CM

## 2024-03-03 DIAGNOSIS — E785 Hyperlipidemia, unspecified: Secondary | ICD-10-CM

## 2024-03-03 DIAGNOSIS — I25118 Atherosclerotic heart disease of native coronary artery with other forms of angina pectoris: Secondary | ICD-10-CM

## 2024-03-17 ENCOUNTER — Ambulatory Visit (INDEPENDENT_AMBULATORY_CARE_PROVIDER_SITE_OTHER): Admitting: Family Medicine

## 2024-03-17 ENCOUNTER — Encounter: Payer: Self-pay | Admitting: Family Medicine

## 2024-03-17 VITALS — BP 128/72 | HR 80 | Temp 98.7°F | Ht 73.0 in | Wt 271.4 lb

## 2024-03-17 DIAGNOSIS — E785 Hyperlipidemia, unspecified: Secondary | ICD-10-CM | POA: Diagnosis not present

## 2024-03-17 DIAGNOSIS — Z7189 Other specified counseling: Secondary | ICD-10-CM

## 2024-03-17 DIAGNOSIS — Z0000 Encounter for general adult medical examination without abnormal findings: Secondary | ICD-10-CM | POA: Diagnosis not present

## 2024-03-17 DIAGNOSIS — Z125 Encounter for screening for malignant neoplasm of prostate: Secondary | ICD-10-CM

## 2024-03-17 DIAGNOSIS — E1140 Type 2 diabetes mellitus with diabetic neuropathy, unspecified: Secondary | ICD-10-CM

## 2024-03-17 DIAGNOSIS — M791 Myalgia, unspecified site: Secondary | ICD-10-CM

## 2024-03-17 DIAGNOSIS — I25118 Atherosclerotic heart disease of native coronary artery with other forms of angina pectoris: Secondary | ICD-10-CM

## 2024-03-17 LAB — CBC WITH DIFFERENTIAL/PLATELET
Basophils Absolute: 0.1 10*3/uL (ref 0.0–0.1)
Basophils Relative: 1.1 % (ref 0.0–3.0)
Eosinophils Absolute: 0.1 10*3/uL (ref 0.0–0.7)
Eosinophils Relative: 1.4 % (ref 0.0–5.0)
HCT: 44.8 % (ref 39.0–52.0)
Hemoglobin: 14.5 g/dL (ref 13.0–17.0)
Lymphocytes Relative: 21 % (ref 12.0–46.0)
Lymphs Abs: 1.5 10*3/uL (ref 0.7–4.0)
MCHC: 32.3 g/dL (ref 30.0–36.0)
MCV: 82.7 fl (ref 78.0–100.0)
Monocytes Absolute: 0.7 10*3/uL (ref 0.1–1.0)
Monocytes Relative: 9.3 % (ref 3.0–12.0)
Neutro Abs: 4.8 10*3/uL (ref 1.4–7.7)
Neutrophils Relative %: 67.2 % (ref 43.0–77.0)
Platelets: 265 10*3/uL (ref 150.0–400.0)
RBC: 5.42 Mil/uL (ref 4.22–5.81)
RDW: 14.3 % (ref 11.5–15.5)
WBC: 7.1 10*3/uL (ref 4.0–10.5)

## 2024-03-17 LAB — COMPREHENSIVE METABOLIC PANEL WITH GFR
ALT: 28 U/L (ref 0–53)
AST: 24 U/L (ref 0–37)
Albumin: 4.5 g/dL (ref 3.5–5.2)
Alkaline Phosphatase: 60 U/L (ref 39–117)
BUN: 16 mg/dL (ref 6–23)
CO2: 26 meq/L (ref 19–32)
Calcium: 9.4 mg/dL (ref 8.4–10.5)
Chloride: 104 meq/L (ref 96–112)
Creatinine, Ser: 0.72 mg/dL (ref 0.40–1.50)
GFR: 106.59 mL/min (ref >60.00)
Glucose, Bld: 110 mg/dL — ABNORMAL HIGH (ref 70–99)
Potassium: 4.5 meq/L (ref 3.5–5.1)
Sodium: 139 meq/L (ref 135–145)
Total Bilirubin: 0.7 mg/dL (ref 0.2–1.2)
Total Protein: 7.1 g/dL (ref 6.0–8.3)

## 2024-03-17 LAB — LIPID PANEL
Cholesterol: 85 mg/dL (ref 0–200)
HDL: 42.5 mg/dL (ref >39.00)
LDL Cholesterol: 29 mg/dL (ref 0–99)
NonHDL: 42.17
Total CHOL/HDL Ratio: 2
Triglycerides: 68 mg/dL (ref 0.0–149.0)
VLDL: 13.6 mg/dL (ref 0.0–40.0)

## 2024-03-17 LAB — PSA: PSA: 0.45 ng/mL (ref 0.10–4.00)

## 2024-03-17 LAB — CK: Total CK: 132 U/L (ref 7–232)

## 2024-03-17 NOTE — Progress Notes (Unsigned)
 CPE- See plan.  Routine anticipatory guidance given to patient.  See health maintenance.  The possibility exists that previously documented standard health maintenance information may have been brought forward from a previous encounter into this note.  If needed, that same information has been updated to reflect the current situation based on today's encounter.    Tetanus 2023 Flu  yearly.   PNA prev done.  Shingles d/w pt covid 2021 Colonoscopy 2021 PSA pending 2025 Living will d/w pt. Wife designated if patient were incapacitated.    Diabetes:  Per endo.  A1c controlled.  MALB neg.  He is still on farxiaga. He was asking about a change to jardiance .  He is going to check with Dr. Aldona Amel about that.  He had eye exam with [facility]ht[facility].    Elevated Cholesterol: Using medications without problems: see below.   Muscle aches:  some, see below.   Diet compliance: d/w pt.   Exercise: d/w pt.   Some aches with/without statin.  He can put up with his status as is.  He has B lateral epicondyle discomfort after lifting.  D/w pt about icing and trying a tennis elbow strap.    CAD.  Hypertension:   He has cards f/u pending.   Using medication without problems or lightheadedness: yes Chest pain with exertion: he had occ jaw pain with exertion but not consistently with walking. He noted sx more with exertion after eating.  This has been going on for about 1 year, intermittently.   Edema:not unless long plane flight.   Short of breath:no  L shoulder pain with int rotation and with reaching backward but not overhead or ext rotation.  That is reproducible.    PMH and SH reviewed  Meds, vitals, and allergies reviewed.   ROS: Per HPI.  Unless specifically indicated otherwise in HPI, the patient denies:  General: fever. Eyes: acute vision changes ENT: sore throat Cardiovascular: chest pain Respiratory: SOB GI: vomiting GU: dysuria Musculoskeletal: acute back pain Derm: acute rash Neuro:  acute motor dysfunction Psych: worsening mood Endocrine: polydipsia Heme: bleeding Allergy: hayfever  GEN: nad, alert and oriented HEENT: mucous membranes moist NECK: supple w/o LA CV: rrr. PULM: ctab, no inc wob ABD: soft, +bs EXT: no edema SKIN: Well-perfused L shoulder pain with int rotation and with reaching backward but not overhead or ext rotation.  That is reproducible.

## 2024-03-17 NOTE — Patient Instructions (Addendum)
 Go to the lab on the way out.   If you have mychart we'll likely use that to update you.    Take care.  Glad to see you.

## 2024-03-18 ENCOUNTER — Ambulatory Visit: Payer: Self-pay | Admitting: Family Medicine

## 2024-03-18 DIAGNOSIS — I10 Essential (primary) hypertension: Secondary | ICD-10-CM | POA: Diagnosis not present

## 2024-03-18 DIAGNOSIS — G4733 Obstructive sleep apnea (adult) (pediatric): Secondary | ICD-10-CM | POA: Diagnosis not present

## 2024-03-18 NOTE — Assessment & Plan Note (Signed)
 A1c controlled.  MALB neg.  He is still on farxiaga. He was asking about a change to jardiance .  He is going to check with Dr. Aldona Amel about that.

## 2024-03-18 NOTE — Assessment & Plan Note (Signed)
 Tetanus 2023 Flu  yearly.   PNA prev done.  Shingles d/w pt covid 2021 Colonoscopy 2021 PSA pending 2025 Living will d/w pt. Wife designated if patient were incapacitated.

## 2024-03-18 NOTE — Assessment & Plan Note (Signed)
 Living will d/w pt.  Wife designated if patient were incapacitated.   ?

## 2024-03-18 NOTE — Assessment & Plan Note (Addendum)
 Chest pain-free at office visit.  He has had occasional jaw pain with exertion but this is not consistent.  He noticed it more after eating.  Is been going on for about a year and he has cardiology follow-up pending.  Given the duration, I am going to route this note to cardiology for input at the upcoming visit.  His shoulder pain appears to be mechanical in a separate issue and we can address this later.

## 2024-03-18 NOTE — Assessment & Plan Note (Signed)
 Some aches with/without statin.  He can put up with his status as is.  He has B lateral epicondyle discomfort after lifting.  D/w pt about icing and trying a tennis elbow strap.  This may not be statin related.  See notes on labs.  Continue work on diet and exercise.

## 2024-03-25 DIAGNOSIS — G4733 Obstructive sleep apnea (adult) (pediatric): Secondary | ICD-10-CM | POA: Diagnosis not present

## 2024-03-26 ENCOUNTER — Other Ambulatory Visit: Payer: Self-pay | Admitting: Cardiovascular Disease

## 2024-03-29 ENCOUNTER — Other Ambulatory Visit: Payer: Self-pay | Admitting: Endocrinology

## 2024-03-29 ENCOUNTER — Other Ambulatory Visit: Payer: Self-pay | Admitting: Cardiovascular Disease

## 2024-03-31 ENCOUNTER — Ambulatory Visit: Attending: Cardiovascular Disease | Admitting: Cardiovascular Disease

## 2024-03-31 ENCOUNTER — Encounter: Payer: Self-pay | Admitting: Cardiovascular Disease

## 2024-03-31 DIAGNOSIS — G4733 Obstructive sleep apnea (adult) (pediatric): Secondary | ICD-10-CM | POA: Diagnosis not present

## 2024-03-31 DIAGNOSIS — Z6837 Body mass index (BMI) 37.0-37.9, adult: Secondary | ICD-10-CM

## 2024-03-31 DIAGNOSIS — I25118 Atherosclerotic heart disease of native coronary artery with other forms of angina pectoris: Secondary | ICD-10-CM | POA: Diagnosis not present

## 2024-03-31 DIAGNOSIS — E785 Hyperlipidemia, unspecified: Secondary | ICD-10-CM | POA: Diagnosis not present

## 2024-03-31 DIAGNOSIS — E66812 Obesity, class 2: Secondary | ICD-10-CM

## 2024-03-31 DIAGNOSIS — Z720 Tobacco use: Secondary | ICD-10-CM

## 2024-03-31 MED ORDER — NITROGLYCERIN 0.4 MG SL SUBL: 0.4000 mg | SUBLINGUAL_TABLET | SUBLINGUAL | 6 refills | Status: AC | PRN

## 2024-03-31 NOTE — Patient Instructions (Signed)
 Medication Instructions:  NO CHANGES   Lab Work: NONE   Testing/Procedures: NONE  Follow-Up: At [facility], you and your health needs are our priority.  As part of our continuing mission to provide you with exceptional heart care, our providers are all part of one team.  This team includes your primary Cardiologist (physician) and Advanced Practice Providers or APPs (Physician Assistants and Nurse Practitioners) who all work together to provide you with the care you need, when you need it.  Your next appointment:   6-8 MONTHS  Provider:   DR. Addie Holstein, MD

## 2024-04-02 ENCOUNTER — Other Ambulatory Visit (HOSPITAL_COMMUNITY): Payer: Self-pay

## 2024-04-02 ENCOUNTER — Telehealth: Payer: Self-pay | Admitting: Pharmacy Technician

## 2024-04-02 NOTE — Telephone Encounter (Signed)
 Pharmacy Patient Advocate Encounter   Received notification from CoverMyMeds that prior authorization for Mounjaro  10MG /0.5ML auto-injectors is required/requested.   Insurance verification completed.   The patient is insured through [employer] .   Per test claim: PA required; PA submitted to above mentioned insurance via CoverMyMeds Key/confirmation #/EOC [id number] Status is pending

## 2024-04-04 ENCOUNTER — Encounter: Payer: Self-pay | Admitting: Cardiovascular Disease

## 2024-04-04 NOTE — Progress Notes (Signed)
 Cardiology Office Note    Date:  04/04/2024   ID:  Jerry Eaton, DOB 1970-10-23, MRN [id number]  PCP:  Donnie Galea, MD  Cardiologist:  Magnus Schuller, MD   2 month F/U cardiology/sleep evaluation initially referred by Richrd Char, MD for evaluation of chest pain  History of Present Illness:  Jerry Eaton is a 53 y.o. male who is followed by Dr. Vallarie Gauze for primary care.  I saw him for my initial evaluation on December 03, 2022 an last saw him on August 12, 2023.   Jerry Eaton has a history of diabetes mellitus for at least 10 years and most recently is on Farxiga  and metformin .  He also is on low-dose lisinopril  not for hypertension but for potential renal preservation.  He had recently traveled to [place] and had to climb over 270 steps.  He would experience some shortness of breath.  Recently, he has experienced some vague chest pressure in his neck and jaw and experienced symptoms on October 14, 2022.  He did not go to the emergency room.  He felt this discomfort did not feel like his previous GERD however his symptoms did improve with burping.  He was evaluated by Dr. Vallarie Gauze on November 23, 2022.  He has had several episodes of this lower sternal burning with his last episode approximately 1 week before his November 23, 2018 for evaluation.  He had stopped taking Mounjaro .  He has a history of hyperlipidemia and had been on rosuvastatin  which he had stopped and is currently taking Zetia  10 mg daily. He tells me he was diagnosed with very mild sleep apnea approximately 15 years ago.  He does admit to snoring.  He believes his sleep is nonrestorative.  He cannot sleep on his back.  Epworth Sleepiness Scale score was calculated in the office today and this endorsed at an, consistent with excessive daytime sleepiness.    During his initial evaluation, I recommended that he undergo a 2D echo Doppler study to evaluate his recent symptomatology and shortness of breath.  With his chest  tightness I scheduled him for coronary CTA to assess both calcium  score as well as potential luminal obstruction.  I also recommended he undergo a sleep study with a high likelihood for obstructive sleep apnea.  Coronary CTA done on December 11, 2022 showed significant increase calcium  score at 716 representing 99th percentile.  There appeared to be a chronically occluded mid circumflex.  There was moderate plaque in the very distal LAD, proximal diagonal 1 and otherwise mild disease.  FFR analysis was 0.77 in the very distal LAD most likely due to vessel tapering and 0.69 and diagonal 1.  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  RCA had mild plaque with normal FFR.  A 2D echo Doppler study on January 01, 2023 showed a EF of 55 to 60% without wall motion abnormality with mild concentric LVH.  Apparently prior to my subsequent evaluation he had been seen by Hildy Lowers, NP after he had developed a recurrent episode of pain radiating to his jaw.  At that time, she reviewed the CTA findings with Dr. [place] and on January 14, 2023, earlier this week had undergone definitive cardiac catheterization.  He was found to have two-vessel obstructive disease with borderline obstructive disease in the first diagonal vessel which was tortuous proximally.  The left circumflex had diffuse severe obstruction but was very small caliber and medical therapy was recommended.  When I saw  him in follow-up of his catheterization on January 17, 2023, I reviewed his angiographic findings and agreed with the recommendation for medical therapy.  During that evaluation, he was on lisinopril  2.5 mg and metoprolol  to tartrate 25 mg twice a day for hypertension and CAD.  I recommended the addition of amlodipine  at 2.5 mg daily and depending upon blood pressure response this could be further titrated to 5 mg.  His BP at that day was 116/80.  I also recommended he increase rosuvastatin  to 20 mg for more aggressive lipid-lowering therapy  and restart Zetia  with target LDL less than 55.  He is diabetic on Farxiga  and metformin .  At his initial visit due to concerns for obstructive sleep apnea I had recommended sleep evaluation.  I saw him on Mar 11, 2023 at which time he was doing well on medical therapy.   He denied any recent anginal symptomatology.  He quit smoking since his cardiac catheterization.  He continues to be on amlodipine  2.5 mg, lisinopril  2.5 mg, metoprolol  tartrate 25 mg twice a day.  He is diabetic on metformin  and Mounjaro .  He has recently been started on Repatha  140 mg every 2 weeks to take in addition to rosuvastatin  20 mg.  He had undergone a home sleep study on January 30, 2023 which showed mild overall obstructive sleep apnea with an AHI of 8.2/h.  However supine sleep was absent and I suspect the overall severity may have been under estimated if supine sleep was present.  He had moderate oxygen desaturation to nadir of 86%.  He snored for 54.3 minutes of 14.9% of his sleep.  During that evaluation we discussed that she should have AutoPap therapy.  I saw him on August 12, 2023. He had not been contacted regarding AutoPap.  He has continued to have mild additional weight loss and has lost approximately 10 pounds since his last visit.  His peak weight 4 years ago was 340 pounds had reduced to 270.  He denies any recurrent chest pain or significant shortness of breath.  At times he notes some mild dizziness if he stands abruptly.  He denies any awareness of palpitations.  He continues to be on amlodipine  2.5 mg, lisinopril  2.5 mg, metoprolol  tartrate 25 mg twice a day for blood pressure control.  He is diabetic on Farxiga , metformin , in addition to Mounjaro .  With his significant hyperlipidemia he is on Zetia  10 mg, rosuvastatin  20 mg, in addition to Repatha .  In February 2013 prior to initiation of Repatha  LDL cholesterol was 151.  I recommended he undergo fasting laboratory and try to expedite initiation of CPAP  therapy.  I last saw him on October 03, 2023.  He received a new ResMed AirSense 11 AutoSet CPAP unit on August 26, 2023 with [facility] as his DME.  Compliance data from November 10 through September 30, 2023 verifies compliance with 87% of usage days and average use of 6 hours 30 minutes.  His CPAP set at a pressure range of 7 to 18 cm and AHI is excellent at 0.5.  He typically goes to bed at 11 PM and wakes up at 5:30 AM.  Laboratory, August 12, 2023 showed creatinine 0.67.  Potassium was minimally elevated at 5.3.  Lipids reveal total cholesterol 73, triglycerides 105, HDL 37 and LDL was outstanding at 16.  Hemoglobin A1c on October 7 was 6.4.  During that evaluation, blood pressure was stable off lisinopril .  He continued to be on low-dose amlodipine  at 2.5 mg, metoprolol   tartrate 25 mg twice a day.  He was on Farxiga , Mounjaro  and metformin  for his diabetes mellitus and potential weight loss.  He continues to be on Zetia , rosuvastatin  and Repatha  for aggressive lipid management.  Since I last saw him, he has continued to feel well.  He continues to see Dr. Richrd Char for primary care and sees Dr. Marianna Shirk for endocrine.  He continues to be on Zetia  10 mg, Repatha  and rosuvastatin  20 mg daily.  Lipid studies from Mar 17, 2024 were excellent with total cholesterol 85, HDL 42, LDL 29 and triglycerides 68.  Renal function was excellent with creatinine at 0.72.  Potassium is 4.5.  He is not having any classic anginal symptoms but at times he notes his jaw aching when walking.  He is on amlodipine  2.5 mg, metoprolol  tartrate 25 mg twice a day.  He is on Farxiga  in addition to Mounjaro  for his diabetes mellitus.  From a sleep perspective, he continues to use CPAP therapy.  A download from May 11 through March 30, 2024 shows good compliance.  However he was out of town for 4 days and did not take his machine with him.  Average use was 6 hours and 10 minutes.  AHI was 0.5 with his pressure range at 7 to 18  cm.  95th percentile pressure is 9.5 with maximal average pressure 10.6.  Past Medical History:  Diagnosis Date   Clotting disorder (HCC)    DVT   Diabetes mellitus    DVT (deep venous thrombosis) (HCC)    Family history of polyps in the colon    Fatty liver    GERD (gastroesophageal reflux disease)    History of chicken pox    Hyperlipidemia    no meds now 05-28-16   Obesity    Sleep apnea    not using CPAP at this time 05-28-16    Past Surgical History:  Procedure Laterality Date   COLONOSCOPY     2012, 2017   LEFT HEART CATH AND CORONARY ANGIOGRAPHY N/A 01/14/2023   Procedure: LEFT HEART CATH AND CORONARY ANGIOGRAPHY;  Surgeon: Swaziland, Peter M, MD;  Location: [facility] INVASIVE CV LAB;  Service: Cardiovascular;  Laterality: N/A;   LUMBAR DISC SURGERY     L4/L5   PILONIDAL CYST EXCISION     2001    Current Medications: Outpatient Medications Prior to Visit  Medication Sig Dispense Refill   amLODipine  (NORVASC ) 2.5 MG tablet TAKE ONE TABLET BY MOUTH ONCE A DAY 90 tablet 1   aspirin  EC 81 MG tablet Take 1 tablet (81 mg total) by mouth daily. Swallow whole. 90 tablet 3   Continuous Glucose Sensor (FREESTYLE LIBRE 3 PLUS SENSOR) MISC Inject 1 Device into the skin continuous. Change every 15 days 6 each 3   Continuous Glucose Sensor (FREESTYLE LIBRE 3 SENSOR) MISC 1 each by Does not apply route every 14 (fourteen) days. 6 each 3   dapagliflozin  propanediol (FARXIGA ) 10 MG TABS tablet TAKE ONE TABLET BY MOUTH ONCE A DAY BEFORE BREAKFAST 90 tablet 3   Evolocumab  (REPATHA  SURECLICK) 140 MG/ML SOAJ INJECT 140MG  INTO THE SKIN EVERY 14 DAYS 6 mL 1   ezetimibe  (ZETIA ) 10 MG tablet TAKE ONE TABLET BY MOUTH ONE TIME DAILY 90 tablet 1   famotidine  (PEPCID ) 20 MG tablet Take 1 tablet (20 mg total) by mouth 2 (two) times daily.     ibuprofen  (ADVIL ) 200 MG tablet Take 1-3 tablets (200-600 mg total) by mouth every 8 (eight) hours  as needed (with food).     loratadine  (CLARITIN ) 10 MG tablet Take 1  tablet (10 mg total) by mouth daily. 30 tablet 12   metoprolol  tartrate (LOPRESSOR ) 25 MG tablet Take 1 tablet (25 mg total) by mouth 2 (two) times daily. 180 tablet 2   rosuvastatin  (CRESTOR ) 20 MG tablet Take 1 tablet (20 mg total) by mouth at bedtime. 90 tablet 3   tirzepatide  (MOUNJARO ) 10 MG/0.5ML Pen INJECT 0.5ML(10MG ) INTO THE SKIN ONCE A WEEK 6 mL 3   nitroGLYCERIN  (NITROSTAT ) 0.4 MG SL tablet Place 1 tablet (0.4 mg total) under the tongue every 5 (five) minutes as needed for chest pain. Max 3 doses in 15 minutes. 25 tablet 3   dapagliflozin  propanediol (FARXIGA ) 5 MG TABS tablet Take 5 mg by mouth daily. (Patient not taking: Reported on 03/31/2024)     No facility-administered medications prior to visit.     Allergies:   Other, Chantix [varenicline tartrate], Januvia [sitagliptin phosphate], and Lipitor [atorvastatin calcium ]   Social History   Socioeconomic History   Marital status: Married    Spouse name: Not on file   Number of children: 0   Years of education: Not on file   Highest education level: Not on file  Occupational History   Occupation: Editor, commissioning  Tobacco Use   Smoking status: Former    Current packs/day: 1.00    Average packs/day: 1 pack/day for 20.0 years (20.0 ttl pk-yrs)    Types: Cigarettes   Smokeless tobacco: Never   Tobacco comments:    12/03/2022-Patient smokes a pack daily  Substance and Sexual Activity   Alcohol use: Yes    Comment: very rare   Drug use: No   Sexual activity: Yes  Other Topics Concern   Not on file  Social History Narrative   [employer] grad   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, travels for work   Married 2019   1 stepdaughter   Social Drivers of Health   Financial Resource Strain: Patient Declined (03/16/2024)   Overall Financial Resource Strain (CARDIA)    Difficulty of Paying Living Expenses: Patient declined  Food Insecurity: Patient Declined (03/16/2024)   Hunger Vital Sign    Worried About Running Out  of Food in the Last Year: Patient declined    Ran Out of Food in the Last Year: Patient declined  Transportation Needs: Patient Declined (03/16/2024)   PRAPARE - Administrator, Civil Service (Medical): Patient declined    Lack of Transportation (Non-Medical): Patient declined  Physical Activity: Unknown (03/16/2024)   Exercise Vital Sign    Days of Exercise per Week: Patient declined    Minutes of Exercise per Session: Not on file  Stress: Patient Declined (03/16/2024)   [employer] of Occupational Health - Occupational Stress Questionnaire    Feeling of Stress : Patient declined  Social Connections: Unknown (03/16/2024)   Social Connection and Isolation Panel    Frequency of Communication with Friends and Family: Patient declined    Frequency of Social Gatherings with Friends and Family: Patient declined    Attends Religious Services: Patient declined    Database administrator or Organizations: Patient declined    Attends Banker Meetings: Not on file    Marital Status: Married    He was born in [place].  He attended [employer].  He has a degree from [employer] and majored in zoology.  He is married for 4-1/2 years.  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  He has a 30-year history of tobacco use, 1 pack/day an fortunately quit at the time of his catheterization he does drink occasional beer and liquor.  He does not routinely exercise.  Family History:  The patient's family history includes Colon cancer in his paternal uncle; Diabetes in his father, maternal uncle, and mother; Hyperlipidemia in his brother; Prostate cancer in his father; Stroke in his mother.  His mother is 46 and has a history of stroke, atrial fibrillation and diabetes.  Father died at age 30 and had heart disease, cancer and diabetes.  He has a brother age 53 and a sister age 47.  ROS General: Negative; No fevers, chills, or  night sweats;  HEENT: Negative; No changes in vision or hearing, sinus congestion, difficulty swallowing Pulmonary: Negative; No cough, wheezing, shortness of breath, hemoptysis Cardiovascular: See HPI GI: Negative; No nausea, vomiting, diarrhea, or abdominal pain GU: Negative; No dysuria, hematuria, or difficulty voiding Musculoskeletal: Negative; no myalgias, joint pain, or weakness Hematologic/Oncology: Negative; no easy bruising, bleeding Endocrine: Diabetes mellitus Neuro: Negative; no changes in balance, headaches Skin: Negative; No rashes or skin lesions Psychiatric: Negative; No behavioral problems, depression Sleep:  OSA with previous positive for snoring, daytime sleepiness, nonrestorative sleep, and difficulty sleeping on his back.No bruxism, restless legs, hypnogognic hallucinations, no cataplexy   Epworth Sleepiness Scale: Situation   Chance of Dozing/Sleeping (0 = never , 1 = slight chance , 2 = moderate chance , 3 = high chance )   sitting and reading 2 > 1   watching TV 2    1   sitting inactive in a public place 1    1   being a passenger in a motor vehicle for an hour or more 3    2   lying down in the afternoon 1    2   sitting and talking to someone 0    0   sitting quietly after lunch (no alcohol) 1    1   while stopped for a few minutes in traffic as the driver 0    0   Total Score  10  >> 8    Other comprehensive 14 point system review is negative.   PHYSICAL EXAM:   VS:  BP 118/72   Pulse 71   Ht 6' (1.829 m)   Wt 268 lb 3.2 oz (121.7 kg)   SpO2 96%   BMI 36.37 kg/m     Repeat blood pressure by me was low at 114/70.  Wt Readings from Last 3 Encounters:  03/31/24 268 lb 3.2 oz (121.7 kg)  03/17/24 271 lb 6.4 oz (123.1 kg)  01/27/24 272 lb 12.8 oz (123.7 kg)    General: Alert, oriented, no distress.  Skin: normal turgor, no rashes, warm and dry HEENT: Normocephalic, atraumatic. Pupils equal round and reactive to light; sclera anicteric;  extraocular muscles intact;  Nose without nasal septal hypertrophy Mouth/Parynx benign; Mallinpatti scale 3 Neck: No JVD, no carotid bruits; normal carotid upstroke Lungs: clear to ausculatation and percussion; no wheezing or rales Chest wall: without tenderness to palpitation Heart: PMI not displaced, RRR, s1 s2 normal, 1/6 systolic murmur, no diastolic murmur, no rubs, gallops, thrills, or heaves Abdomen: soft, nontender; no hepatosplenomehaly, BS+; abdominal aorta nontender and not dilated by palpation. Back: no CVA tenderness Pulses 2+ Musculoskeletal: full range of motion, normal strength, no joint deformities Extremities: no clubbing cyanosis or edema, Homan's sign negative  Neurologic: grossly nonfocal; Cranial nerves grossly wnl Psychologic: Normal mood and affect     Studies/Labs Reviewed:   EKG Interpretation Date/Time:  Tuesday March 31 2024 11:48:07 EDT Ventricular Rate:  71 PR Interval:  192 QRS Duration:  96 QT Interval:  386 QTC Calculation: 419 R Axis:   -23  Text Interpretation: Normal sinus rhythm Inferior infarct (cited on or before 12-Aug-2023) When compared with ECG of 03-Oct-2023 08:20, Minimal criteria for Anterior infarct are no longer Present Confirmed by Magnus Schuller ([id number]) on 04/04/2024 7:01:18 PM    October 03, 2023 ECG (independently read by me): Normal sinus rhythm at 74, inferior infarct  August 12, 2023 ECG (independently read by me): Normal sinus rhythm at 77, Q-wave in lead III and aVF  Mar 11, 2023 ECG (independently read by me): NSR at 75, Q III, aVF  January 17, 2023 ECG (independently read by me): NSR at 64, Q III, aVF, no ectopy  December 03, 2022 ECG (independently read by me): Sinus rhythm at 90, PAC, Left axis deviation, Inferior Q wave III, aVF; QTc 455 msec  Recent Labs:    Latest Ref Rng & Units 03/17/2024    9:30 AM 08/12/2023    8:55 AM 01/08/2023   10:26 AM  BMP  Glucose 70 - 99 mg/dL 676  195  093   BUN 6 - 23 mg/dL 16  18   17    Creatinine 0.40 - 1.50 mg/dL 2.67  1.24  5.80   BUN/Creat Ratio 9 - 20  27  20    Sodium 135 - 145 mEq/L 139  140  142   Potassium 3.5 - 5.1 mEq/L 4.5  5.3  5.2   Chloride 96 - 112 mEq/L 104  105  104   CO2 19 - 32 mEq/L 26  24  22    Calcium  8.4 - 10.5 mg/dL 9.4  9.7  9.7         Latest Ref Rng & Units 03/17/2024    9:30 AM 08/12/2023    8:55 AM 11/23/2022    9:58 AM  Hepatic Function  Total Protein 6.0 - 8.3 g/dL 7.1  7.0  7.1   Albumin 3.5 - 5.2 g/dL 4.5  4.6  4.4   AST 0 - 37 U/L 24  16  16    ALT 0 - 53 U/L 28  18  21    Alk Phosphatase 39 - 117 U/L 60  72  59   Total Bilirubin 0.2 - 1.2 mg/dL 0.7  0.3  0.7        Latest Ref Rng & Units 03/17/2024    9:30 AM 08/12/2023    8:55 AM 01/08/2023   10:26 AM  CBC  WBC 4.0 - 10.5 K/uL 7.1  6.7  6.3   Hemoglobin 13.0 - 17.0 g/dL 99.8  33.8  25.0   Hematocrit 39.0 - 52.0 % 44.8  45.6  45.6   Platelets 150.0 - 400.0 K/uL 265.0  307  293    Lab Results  Component Value Date   MCV 82.7 03/17/2024   MCV 87 08/12/2023   MCV 85 01/08/2023   Lab Results  Component Value Date   TSH 0.88 11/23/2022   Lab Results  Component Value Date   HGBA1C 6.5 (A) 01/27/2024     BNP No results found for: BNP  ProBNP No results found for: PROBNP   Lipid Panel     Component Value Date/Time   CHOL 85 03/17/2024 0930   CHOL 73 (L)  08/12/2023 0855   TRIG 68.0 03/17/2024 0930   HDL 42.50 03/17/2024 0930   HDL 37 (L) 08/12/2023 0855   CHOLHDL 2 03/17/2024 0930   VLDL 13.6 03/17/2024 0930   LDLCALC 29 03/17/2024 0930   LDLCALC 16 08/12/2023 0855   LDLCALC 90 06/15/2022 1504   LDLDIRECT 163.5 05/16/2012 0812   LABVLDL 20 08/12/2023 0855     RADIOLOGY: No results found.   Additional studies/ records that were reviewed today include:  I reviewed the records of Dr. Richrd Char.  CARDIAC CATH: 01/14/2023   1st Diag lesion is 70% stenosed.   Prox LAD to Dist LAD lesion is 20% stenosed.   Ramus lesion is 25% stenosed with  25% stenosed side branch in Lat Ramus.   Prox Cx to Dist Cx lesion is 95% stenosed.   Prox RCA to Mid RCA lesion is 30% stenosed.   The left ventricular systolic function is normal.   LV end diastolic pressure is normal.   The left ventricular ejection fraction is 55-65% by visual estimate.   2 vessel obstructive CAD. There is borderline obstructive disease in the first diagonal which is tortuous proximally. The LCx is a small caliber vessel with diffuse severe obstruction Normal LV function Normal LV EDP   Plan: recommend aggressive risk factor modification and antianginal therapy. The LCx is poorly suited for PCI given small diameter and diffuse disease.      ASSESSMENT:    1. Coronary artery disease of native artery of native heart with stable angina pectoris (HCC)   2. OSA (obstructive sleep apnea)   3. Hyperlipidemia with target LDL less than 55   4. Class 2 severe obesity due to excess calories with serious comorbidity and body mass index (BMI) of 37.0 to 37.9 in adult (HCC)   5. Tobacco use: 30 year history, quit March 2024     PLAN:  Jerry Eaton is a 53 year old gentleman who has a 10-year history of diabetes mellitus, 30-year history of prior tobacco use, history of GERD, and recently has experienced episodes of lower chest discomfort radiating to his back and also experiencing some lower jaw discomfort.   He has been on Farxiga  10 mg and metformin  1000 mg twice a day for hyperlipidemia.  Remotely, he had been on rosuvastatin  5 mg but stopped therapy.  Laboratory in June 15, 2022 showed total cholesterol 158, HDL 35, triglycerides were significantly elevated at 237 and LDL cholesterol was 90.  I suspect he most likely had increased LDL particle number with probable small LDL particles if NMR LipoProfile analysis was performed.  He had traveled to [place] and did hike and climb a significant number of steps to his temple.  He had recently developed chest pain.  An  echo Doppler study revealed normal systolic function with EF 55 to 60% with mild LVH.  There was very mild dilation of aortic root at 37 mm.  Coronary CTA revealed an elevated calcium  score at 716 representing 99th percentile.  At cardiac catheterization he was found to have two-vessel obstructive disease with severe stenosis in a small caliber mid distal circumflex vessel.  There is also generalized 70% stenosis in a tortuous first diagonal vessel.  At his subsequent office visit I added amlodipine  to his current regimen.  Fortunately, he quit tobacco use following his catheterization.  In May 2024 he was chest pain-free on his regimen of low-dose amlodipine  2.5 mg, lisinopril  2.5 mg and metoprolol  25 mg twice a day.  He was  started on Repatha  injections to take in addition to rosuvastatin  20 mg.Target LDL is less than 55.  He is now on Mounjaro  in addition to metformin  for diabetes mellitus.  When evaluated by me in October 2024, blood pressure was low and I recommended discontinuance of lisinopril  but to continue low-dose amlodipine  2.5 mg and metoprolol  to tartrate 25 mg twice a day for his CAD.  Subsequent laboratory stable with creatinine 0.67.  Potassium was minimally increased at 5.3.  Lipid studies were excellent with total cholesterol 73, triglycerides 105, HDL was low at 37 but LDL was 16.  LP(a) is normal at 26.2.  Presently, his blood pressure is stable.  He denies any chest tightness or shortness of breath.  However, he has noticed a vague jaw ache at times when walking.  I have recommended he have a prescription for as needed sublingual nitroglycerin .  He has been documented to have obstructive sleep apnea and received a new ResMed AirSense 11 AutoSet device on November 4 for his obstructive sleep apnea.  He is meeting compliance standards.  On his most recent download from May 11 through March 30, 2024 AHI was excellent at 0.5 and his 95th percentile pressure was 9.5 with maximal average pressure  10.0.  His CPAP is set at a pressure range of 7 to 18 cm.  There were several days that month where he went out of town and did not take his machine with him on the airplane.  I have recommended that he take his CPAP with him whenever he travels since he will undoubtedly sleep better and have more energy on his vacations.  He continues to be on lipid management with Zetia , rosuvastatin  20 mg and Repatha .  Most recent LDL cholesterol was excellent at 29 with total cholesterol 85 and triglycerides 68.  Renal function is normal.  He is losing some weight on Mounjaro  and also takes Farxiga  and metformin  for his diabetes mellitus.  He is aware of my imminent retirement.  I will transition him to the care of Dr. Randene Bustard with plan follow-up Cardiologic evaluation in 6 to 8 months or sooner as needed.  From a sleep perspective, he will see Dr. Micael Adas as needed.     Medication Adjustments/Labs and Tests Ordered: Current medicines are reviewed at length with the patient today.  Concerns regarding medicines are outlined above.  Medication changes, Labs and Tests ordered today are listed in the Patient Instructions below. Patient Instructions  Medication Instructions:  NO CHANGES   Lab Work: NONE   Testing/Procedures: NONE  Follow-Up: At [facility], you and your health needs are our priority.  As part of our continuing mission to provide you with exceptional heart care, our providers are all part of one team.  This team includes your primary Cardiologist (physician) and Advanced Practice Providers or APPs (Physician Assistants and Nurse Practitioners) who all work together to provide you with the care you need, when you need it.  Your next appointment:   6-8 MONTHS  Provider:   DR. Addie Holstein, MD    Signed, Magnus Schuller, MD, [facility], ABSM Diplomate, [employer]  04/04/2024 7:08 PM    [facility] [address], [address], [place],  [place]  [address] Phone: [phone]

## 2024-04-08 ENCOUNTER — Other Ambulatory Visit (HOSPITAL_COMMUNITY): Payer: Self-pay

## 2024-04-08 NOTE — Telephone Encounter (Signed)
 Pharmacy Patient Advocate Encounter  Received notification from [facility] that Prior Authorization for Mounjaro  10MG /0.5ML auto-injectors has been APPROVED from 03/03/2024 to 04/06/2025. Unable to obtain price due to refill too soon rejection, last fill date 04/06/2024 next available fill date 04/28/2024   PA #/Case ID/Reference #: [id number]

## 2024-04-24 DIAGNOSIS — G4733 Obstructive sleep apnea (adult) (pediatric): Secondary | ICD-10-CM | POA: Diagnosis not present

## 2024-04-27 ENCOUNTER — Other Ambulatory Visit: Payer: Self-pay | Admitting: Cardiovascular Disease

## 2024-05-25 DIAGNOSIS — G4733 Obstructive sleep apnea (adult) (pediatric): Secondary | ICD-10-CM | POA: Diagnosis not present

## 2024-05-27 DIAGNOSIS — I10 Essential (primary) hypertension: Secondary | ICD-10-CM | POA: Diagnosis not present

## 2024-05-27 DIAGNOSIS — G4733 Obstructive sleep apnea (adult) (pediatric): Secondary | ICD-10-CM | POA: Diagnosis not present

## 2024-07-17 DIAGNOSIS — D485 Neoplasm of uncertain behavior of skin: Secondary | ICD-10-CM | POA: Diagnosis not present

## 2024-07-17 DIAGNOSIS — Z1283 Encounter for screening for malignant neoplasm of skin: Secondary | ICD-10-CM | POA: Diagnosis not present

## 2024-07-17 DIAGNOSIS — D225 Melanocytic nevi of trunk: Secondary | ICD-10-CM | POA: Diagnosis not present

## 2024-07-17 DIAGNOSIS — D2371 Other benign neoplasm of skin of right lower limb, including hip: Secondary | ICD-10-CM | POA: Diagnosis not present

## 2024-07-17 DIAGNOSIS — D2271 Melanocytic nevi of right lower limb, including hip: Secondary | ICD-10-CM | POA: Diagnosis not present

## 2024-07-28 ENCOUNTER — Emergency Department (HOSPITAL_BASED_OUTPATIENT_CLINIC_OR_DEPARTMENT_OTHER)

## 2024-07-28 ENCOUNTER — Emergency Department (HOSPITAL_BASED_OUTPATIENT_CLINIC_OR_DEPARTMENT_OTHER)
Admission: EM | Admit: 2024-07-28 | Discharge: 2024-07-28 | Disposition: A | Discharge status: Home / Self Care | Source: Ambulatory Visit | Attending: Emergency Medicine | Admitting: Emergency Medicine

## 2024-07-28 ENCOUNTER — Encounter (HOSPITAL_BASED_OUTPATIENT_CLINIC_OR_DEPARTMENT_OTHER): Payer: Self-pay

## 2024-07-28 ENCOUNTER — Ambulatory Visit: Payer: Self-pay

## 2024-07-28 ENCOUNTER — Other Ambulatory Visit: Payer: Self-pay

## 2024-07-28 DIAGNOSIS — R112 Nausea with vomiting, unspecified: Secondary | ICD-10-CM | POA: Insufficient documentation

## 2024-07-28 DIAGNOSIS — E119 Type 2 diabetes mellitus without complications: Secondary | ICD-10-CM | POA: Diagnosis not present

## 2024-07-28 DIAGNOSIS — R1084 Generalized abdominal pain: Secondary | ICD-10-CM | POA: Insufficient documentation

## 2024-07-28 DIAGNOSIS — R197 Diarrhea, unspecified: Secondary | ICD-10-CM | POA: Diagnosis not present

## 2024-07-28 DIAGNOSIS — R109 Unspecified abdominal pain: Secondary | ICD-10-CM | POA: Diagnosis not present

## 2024-07-28 LAB — COMPREHENSIVE METABOLIC PANEL WITH GFR
ALT: 23 U/L (ref 0–44)
AST: 19 U/L (ref 15–41)
Albumin: 4.8 g/dL (ref 3.5–5.0)
Alkaline Phosphatase: 81 U/L (ref 38–126)
Anion gap: 15 (ref 5–15)
BUN: 23 mg/dL — ABNORMAL HIGH (ref 6–20)
CO2: 20 mmol/L — ABNORMAL LOW (ref 22–32)
Calcium: 9.9 mg/dL (ref 8.9–10.3)
Chloride: 102 mmol/L (ref 98–111)
Creatinine, Ser: 0.87 mg/dL (ref 0.61–1.24)
GFR, Estimated: >60 mL/min (ref >60)
Glucose, Bld: 176 mg/dL — ABNORMAL HIGH (ref 70–99)
Potassium: 4.5 mmol/L (ref 3.5–5.1)
Sodium: 137 mmol/L (ref 135–145)
Total Bilirubin: 0.8 mg/dL (ref 0.0–1.2)
Total Protein: 8.2 g/dL — ABNORMAL HIGH (ref 6.5–8.1)

## 2024-07-28 LAB — CBC
HCT: 48.3 % (ref 39.0–52.0)
Hemoglobin: 15.9 g/dL (ref 13.0–17.0)
MCH: 27.7 pg (ref 26.0–34.0)
MCHC: 32.9 g/dL (ref 30.0–36.0)
MCV: 84.1 fL (ref 80.0–100.0)
Platelets: 301 K/uL (ref 150–400)
RBC: 5.74 MIL/uL (ref 4.22–5.81)
RDW: 14.6 % (ref 11.5–15.5)
WBC: 6.8 K/uL (ref 4.0–10.5)
nRBC: 0 % (ref 0.0–0.2)

## 2024-07-28 LAB — URINALYSIS, ROUTINE W REFLEX MICROSCOPIC
Bilirubin Urine: NEGATIVE
Glucose, UA: NEGATIVE mg/dL
Hgb urine dipstick: NEGATIVE
Ketones, ur: NEGATIVE mg/dL
Nitrite: NEGATIVE
Protein, ur: NEGATIVE mg/dL
Specific Gravity, Urine: 1.025 (ref 1.005–1.030)
pH: 7 (ref 5.0–8.0)

## 2024-07-28 LAB — URINALYSIS, MICROSCOPIC (REFLEX)

## 2024-07-28 LAB — LIPASE, BLOOD: Lipase: 20 U/L (ref 11–51)

## 2024-07-28 LAB — CBG MONITORING, ED: Glucose-Capillary: 188 mg/dL — ABNORMAL HIGH (ref 70–99)

## 2024-07-28 MED ORDER — ONDANSETRON HCL 4 MG/2ML IJ SOLN
4.0000 mg | Freq: Once | INTRAMUSCULAR | Status: AC
  Administered 2024-07-28: 4 mg via INTRAVENOUS
  Filled 2024-07-28: qty 2

## 2024-07-28 MED ORDER — SODIUM CHLORIDE 0.9 % IV BOLUS
500.0000 mL | Freq: Once | INTRAVENOUS | Status: AC
  Administered 2024-07-28: 500 mL via INTRAVENOUS

## 2024-07-28 MED ORDER — ONDANSETRON 4 MG PO TBDP: 4.0000 mg | ORAL_TABLET | Freq: Three times a day (TID) | ORAL | 0 refills | Status: DC | PRN

## 2024-07-28 MED ORDER — PANTOPRAZOLE SODIUM 40 MG IV SOLR
40.0000 mg | Freq: Once | INTRAVENOUS | Status: AC
Start: 2024-07-28 — End: 2024-07-28
  Administered 2024-07-28: 40 mg via INTRAVENOUS
  Filled 2024-07-28: qty 10

## 2024-07-28 MED ORDER — IOHEXOL 300 MG/ML  SOLN
100.0000 mL | Freq: Once | INTRAMUSCULAR | Status: AC | PRN
  Administered 2024-07-28: 100 mL via INTRAVENOUS

## 2024-07-28 NOTE — ED Notes (Signed)
Patient transported to CT and back without event.

## 2024-07-28 NOTE — Telephone Encounter (Signed)
Noted.  Will await ER report.  ?

## 2024-07-28 NOTE — Telephone Encounter (Signed)
 FYI Only or Action Required?: FYI only for provider.  Patient was last seen in primary care on 03/17/2024 by Cleatus Arlyss RAMAN, MD.  Called Nurse Triage reporting Abdominal Pain.  Symptoms began several days ago.  Interventions attempted: Rest, hydration, or home remedies.  Symptoms are: gradually worsening.  Triage Disposition: Go to ED Now (Notify PCP)  Patient/caregiver understands and will follow disposition?: Yes    Copied from CRM [id number]. Topic: Clinical - Red Word Triage >> Jul 28, 2024  8:14 AM Jerry Eaton wrote: [facility] that prompted transfer to Nurse Triage: abdominal pain Sunday night, bad stomach pains, diarea, vommiting Reason for Disposition  [1] SEVERE pain (e.g., excruciating) AND [2] present > 1 hour  Answer Assessment - Initial Assessment Questions Vomiting started last night, patient is still on Mounjaro  (last injection was last week but hasn't had these symptoms before on Mounjaro ). Diarrhea was lighter color.   1. LOCATION: Where does it hurt?      Patient states it's in the front of his stomach and he can feel it through to his back 2. RADIATION: Does the pain shoot anywhere else? (e.g., chest, back)     Back  3. ONSET: When did the pain begin? (Minutes, hours or days ago)      Sunday night - started with gas/burping 4. SUDDEN: Gradual or sudden onset?     Pretty sudden 5. PATTERN Does the pain come and go, or is it constant?     Comes and goes, but states it's pretty constant 6. SEVERITY: How bad is the pain?  (e.g., Scale 1-10; mild, moderate, or severe)     Worsened when standing up  7. RECURRENT SYMPTOM: Have you ever had this type of stomach pain before? If Yes, ask: When was the last time? and What happened that time?      No 8. CAUSE: What do you think is causing the stomach pain? (e.g., gallstones, recent abdominal surgery)     Patient states Sunday he was burping a lot and it had a weird taste, was uncertain if it was because  he drank old [place] tea.  9. RELIEVING/AGGRAVATING FACTORS: What makes it better or worse? (e.g., antacids, bending or twisting motion, bowel movement)     Worse when standing up 10. OTHER SYMPTOMS: Do you have any other symptoms? (e.g., back pain, diarrhea, fever, urination pain, vomiting)       Diarrhea, vomiting (2 episodes of vomiting), denies symptoms of dehydration  Protocols used: Abdominal Pain - Male-A-AH

## 2024-07-28 NOTE — ED Triage Notes (Signed)
 C/o abdominal pain, diarrhea, vomiting since Sunday. Takes mounjaro , pcp concerned for pancreatitis. States he feels bloated.

## 2024-07-28 NOTE — ED Provider Notes (Signed)
 Emergency Department Provider Note   I have reviewed the triage vital signs and the nursing notes.   HISTORY  Chief Complaint Abdominal Pain   HPI Jerry Eaton is a 53 y.o. male with past reviewed below presents emergency department with abdominal distention, gas, vomiting, diarrhea.  He has intermittent bandlike pain through the lower abdomen as well.  He does take Mounjaro  but has been on this medication for some time and on a stable dose.  No blood in the emesis or stool.  No fevers.  Some burning pain into the chest last night but not as much today. No SOB.    Past Medical History:  Diagnosis Date   Clotting disorder    DVT   Diabetes mellitus    DVT (deep venous thrombosis) (HCC)    Family history of polyps in the colon    Fatty liver    GERD (gastroesophageal reflux disease)    History of chicken pox    Hyperlipidemia    no meds now 05-28-16   Obesity    Sleep apnea    not using CPAP at this time 05-28-16    Review of Systems  Constitutional: No fever/chills Cardiovascular: Denies chest pain. Respiratory: Denies shortness of breath. Gastrointestinal: Positive abdominal pain. Positive nausea, vomiting, and diarrhea.  Skin: Negative for rash. Neurological: Negative for headaches.  ____________________________________________   PHYSICAL EXAM:  VITAL SIGNS: ED Triage Vitals  Encounter Vitals Group     BP 07/28/24 0922 (!) 117/101     Pulse Rate 07/28/24 0922 (!) 107     Resp 07/28/24 0922 18     Temp 07/28/24 0922 98.6 F (37 C)     Temp Source 07/28/24 0922 Oral     SpO2 07/28/24 0922 96 %     Weight 07/28/24 0923 270 lb (122.5 kg)     Height 07/28/24 0923 6' (1.829 m)    Constitutional: Alert and oriented. Well appearing and in no acute distress. Eyes: Conjunctivae are normal. Head: Atraumatic. Nose: No congestion/rhinnorhea. Mouth/Throat: Mucous membranes are moist.  Neck: No stridor.  Cardiovascular: Normal rate, regular rhythm. Good  peripheral circulation. Grossly normal heart sounds.   Respiratory: Normal respiratory effort.  No retractions. Lungs CTAB. Gastrointestinal: Soft with mild tenderness in the lower abdomen. Mild distention.  Musculoskeletal: No gross deformities of extremities. Neurologic:  Normal speech and language.  Skin:  Skin is warm, dry and intact. No rash noted.  ____________________________________________   LABS (all labs ordered are listed, but only abnormal results are displayed)  Labs Reviewed  COMPREHENSIVE METABOLIC PANEL WITH GFR - Abnormal; Notable for the following components:      Result Value   CO2 20 (*)    Glucose, Bld 176 (*)    BUN 23 (*)    Total Protein 8.2 (*)    All other components within normal limits  URINALYSIS, ROUTINE W REFLEX MICROSCOPIC - Abnormal; Notable for the following components:   APPearance HAZY (*)    Leukocytes,Ua TRACE (*)    All other components within normal limits  URINALYSIS, MICROSCOPIC (REFLEX) - Abnormal; Notable for the following components:   Bacteria, UA RARE (*)    All other components within normal limits  CBG MONITORING, ED - Abnormal; Notable for the following components:   Glucose-Capillary 188 (*)    All other components within normal limits  LIPASE, BLOOD  CBC   ____________________________________________  RADIOLOGY  CT ABDOMEN PELVIS W CONTRAST Result Date: 07/28/2024 CLINICAL DATA:  Abdominal pain, acute,  nonlocalized EXAM: CT ABDOMEN AND PELVIS WITH CONTRAST TECHNIQUE: Multidetector CT imaging of the abdomen and pelvis was performed using the standard protocol following bolus administration of intravenous contrast. RADIATION DOSE REDUCTION: This exam was performed according to the departmental dose-optimization program which includes automated exposure control, adjustment of the mA and/or kV according to patient size and/or use of iterative reconstruction technique. CONTRAST:  OMNIPAQUE  IOHEXOL  300 MG/ML  SOLN COMPARISON:   CT abdomen/pelvis dated 04/17/2016. FINDINGS: Lower chest: No acute abnormality. Hepatobiliary: No focal liver abnormality is seen. No gallstones, gallbladder wall thickening, or biliary dilatation. Pancreas: Unremarkable. No pancreatic ductal dilatation or surrounding inflammatory changes. Spleen: Normal in size without focal abnormality. Adrenals/Urinary Tract: Adrenal glands are unremarkable. Kidneys are normal, without renal calculi, focal lesion, or hydronephrosis. Bladder is unremarkable. Stomach/Bowel: Stomach is within normal limits. Dilated air and fluid-filled loops of mid to distal small bowel in the central and right abdomen measuring up to 3.2 cm in diameter with mild wall thickening, which could reflect enteritis or ileus. Partial small bowel obstruction cannot be entirely excluded. No discrete transition point identified. There is tapering to normal caliber distal small bowel. Appendix appears normal. Colon is unremarkable. Vascular/Lymphatic: Abdominal aorta is normal in caliber with trace scattered atherosclerotic calcification. No enlarged abdominal or pelvic lymph nodes. Reproductive: Prostate is unremarkable. Other: No abdominopelvic ascites. No intraperitoneal free air. No abdominal wall hernia. Musculoskeletal: No acute osseous abnormality. No suspicious osseous lesion. Redemonstrated chronic bilateral L5 pars defects and degenerative disc changes at L4-L5 with disc height loss, disc desiccation, and chronic disc extrusion contributing to moderate spinal stenosis at this level. IMPRESSION: 1. Dilated air and fluid-filled loops of mid to distal small bowel in the central and right abdomen measuring up to 3.2 cm in diameter with mild wall thickening, which could reflect enteritis or ileus. Partial small bowel obstruction cannot be entirely excluded. 2. Additional unchanged nonacute findings as described above. Electronically Signed   By: Harrietta Sherry M.D.   On: 07/28/2024 12:39     ____________________________________________   PROCEDURES  Procedure(s) performed:   Procedures  None  ____________________________________________   INITIAL IMPRESSION / ASSESSMENT AND PLAN / ED COURSE  Pertinent labs & imaging results that were available during my care of the patient were reviewed by me and considered in my medical decision making (see chart for details).   This patient is Presenting for Evaluation of abdominal pain, which does require a range of treatment options, and is a complaint that involves a high risk of morbidity and mortality.  The Differential Diagnoses includes but is not exclusive to acute cholecystitis, intrathoracic causes for epigastric abdominal pain, gastritis, duodenitis, pancreatitis, small bowel or large bowel obstruction, abdominal aortic aneurysm, hernia, gastritis, etc.   Critical Interventions-    Medications  sodium chloride  0.9 % bolus 500 mL (500 mLs Intravenous New Bag/Given 07/28/24 1010)  pantoprazole (PROTONIX) injection 40 mg (40 mg Intravenous Given 07/28/24 1011)  ondansetron  (ZOFRAN ) injection 4 mg (4 mg Intravenous Given 07/28/24 1010)  iohexol  (OMNIPAQUE ) 300 MG/ML solution 100 mL (100 mLs Intravenous Contrast Given 07/28/24 1112)    Reassessment after intervention: symptoms improved.    Clinical Laboratory Tests Ordered, included UA without infection.  CBC without leukocytosis or anemia.  CMP without AKI.  Radiologic Tests Ordered, included CT abdomen/pelvis. I independently interpreted the images and agree with radiology interpretation.   Cardiac Monitor Tracing which shows NSR.    Social Determinants of Health Risk patient is a non-smoker.   Medical Decision  Making: Summary:  Patient presents emergency department with abdominal pain and bloating along with vomiting.  Plan for screening blood work and CT abdomen pelvis given exam.  Reevaluation with update and discussion with patient.  He is feeling much better  after IV fluids, Zofran , Protonix.  He is tolerating p.o. fluids in the ED and feeling much better.  My suspicion for partial bowel obstruction is very low.  Favor more enteritis versus possible developing ileus but given his overall well appearance I plan for discharge with continued symptom management and liquid diet with plan to advance as tolerated.  Patient's presentation is most consistent with acute presentation with potential threat to life or bodily function.   Disposition: discharge  ____________________________________________  FINAL CLINICAL IMPRESSION(S) / ED DIAGNOSES  Final diagnoses:  Generalized abdominal pain  Nausea vomiting and diarrhea     NEW OUTPATIENT MEDICATIONS STARTED DURING THIS VISIT:  New Prescriptions   ONDANSETRON  (ZOFRAN -ODT) 4 MG DISINTEGRATING TABLET    Take 1 tablet (4 mg total) by mouth every 8 (eight) hours as needed.    Note:  This document was prepared using Dragon voice recognition software and may include unintentional dictation errors.  Fonda Law, MD, [facility]    Donyea Gafford, Fonda MATSU, MD 07/28/24 [phone]

## 2024-07-28 NOTE — ED Notes (Signed)
 Discharge instructions reviewed with patient. Patient verbalizes understanding, no further questions at this time. Medications/prescriptions and follow up information provided. No acute distress noted at time of departure.

## 2024-07-28 NOTE — Discharge Instructions (Signed)

## 2024-07-31 ENCOUNTER — Telehealth: Payer: Self-pay | Admitting: Family Medicine

## 2024-07-31 NOTE — Telephone Encounter (Signed)
 Copied from CRM [id number]. Topic: General - Other >> Jul 31, 2024  1:52 PM Rosina BIRCH wrote: Reason for CRM: patient called wanting to know about getting a shingles shot. Patient want to know if he can get it at the office or at a pharmacy. Patient also stated he went to the emergency room on Tuesday for his stomach and they were concerned about pancreatis but they did not find anything positive  [phone]

## 2024-08-02 NOTE — Telephone Encounter (Signed)
 How is he feeling in the meantime?  Is he having any abdominal symptoms or belly pain?  Please let me know about that first.  Assuming he is feeling well with no other symptoms then it would be okay to get the shingles vaccine.  I do not know if it would be cheaper for the patient at the pharmacy.  The main issue is to make sure his abdominal symptoms have resolved in the meantime.

## 2024-08-03 ENCOUNTER — Ambulatory Visit: Admitting: Internal Medicine

## 2024-08-03 ENCOUNTER — Encounter: Payer: Self-pay | Admitting: Internal Medicine

## 2024-08-03 VITALS — BP 120/62 | HR 76 | Ht 72.0 in | Wt 281.8 lb

## 2024-08-03 DIAGNOSIS — E785 Hyperlipidemia, unspecified: Secondary | ICD-10-CM | POA: Diagnosis not present

## 2024-08-03 DIAGNOSIS — E66812 Obesity, class 2: Secondary | ICD-10-CM

## 2024-08-03 DIAGNOSIS — Z794 Long term (current) use of insulin: Secondary | ICD-10-CM

## 2024-08-03 DIAGNOSIS — E1140 Type 2 diabetes mellitus with diabetic neuropathy, unspecified: Secondary | ICD-10-CM

## 2024-08-03 LAB — POCT GLYCOSYLATED HEMOGLOBIN (HGB A1C): Hemoglobin A1C: 6.6 % — AB (ref 4.0–5.6)

## 2024-08-03 MED ORDER — MOUNJARO 10 MG/0.5ML ~~LOC~~ SOAJ: 10.0000 mg | SUBCUTANEOUS | 3 refills | Status: AC

## 2024-08-03 NOTE — Telephone Encounter (Signed)
 Called patient reviewed all information and repeated back to me. Will call if any questions.  He is not having any abdominal symptoms at all. He will get from [facility].

## 2024-08-03 NOTE — Progress Notes (Signed)
 Patient ID: Jerry Eaton, male   DOB: 08-04-1971, 53 y.o.   MRN: [id number]   HPI: Jerry Eaton is a 53 y.o.-year-old male, returning for follow-up for DM2, dx in ~2013, insulin -dependent since 2016-2017,  now off insulin , fairly well controlled, with complications (CAD, DR, PN). Last visit 6 months ago.  Interim history: No increased urination, blurry vision, nausea. Also, constipation  - improved. He was in the emergency room 07/28/2024 with nausea/vomiting/abdominal pain.  Abdominal CT showed enteritis versus ileus but could not rule out partial SBO.  His lipase was normal.  He recovered this the next day, without intervention.  He does not feel that this was related to Mounjaro .  Reviewed HbA1c levels: Lab Results  Component Value Date   HGBA1C 6.5 (A) 01/27/2024   HGBA1C 6.4 (A) 07/29/2023   HGBA1C . 07/29/2023   HGBA1C 7.3 (A) 02/19/2023   HGBA1C 6.5 11/23/2022   HGBA1C 6.4 (A) 08/17/2022   HGBA1C 6.6 (A) 02/09/2022   HGBA1C 6.6 (A) 10/06/2021   HGBA1C 7.5 (A) 06/06/2021   HGBA1C 6.9 (A) 01/20/2021   HGBA1C 6.9 (A) 08/29/2020   HGBA1C 6.7 (A) 04/28/2020   HGBA1C 6.2 (A) 08/21/2019   HGBA1C 6.5 (A) 03/26/2019   HGBA1C 7.2 (A) 11/24/2018   HGBA1C 7.2 (A) 07/14/2018   HGBA1C 7.7 (H) 03/06/2018   HGBA1C 7.8 11/25/2017   HGBA1C 7.0 08/20/2017   HGBA1C 8.8 05/10/2017   He is on: -  >> stopped 01/2024 - Farxiga  10 mg in a.m. -  Mounjaro  10 mg weekly -initially GERD  He was previously on Tresiba  64 >> 40 units daily >> stopped ~02/2019 He was previously on Invokana . He was previously on Janumet but developed transaminitis. Previously on Lantus  65 units at bedtime.  He checks sugars >4x a day - with the Libre 3 CGM:  Previously:  Previously:  He has hypoglycemia awareness in the 70s. Lowest: 50s >> ... 80s. Highest: 268 > ... low 200s >> 200s.  Glucometer: [name];  One Touch Ultra 2  Pt's meals are: - Breakfast: bisquits - Lunch: sandwich, burgers  (may skip) - Dinner: burgers, chicken, steak - Snacks: 0-1  -No CKD, last BUN/creatinine:  Lab Results  Component Value Date   BUN 23 (H) 07/28/2024   BUN 16 03/17/2024   CREATININE 0.87 07/28/2024   CREATININE 0.72 03/17/2024   ACR normal: Lab Results  Component Value Date   MICRALBCREAT 6 01/27/2024   On lisinopril  2.5.  -+ HL.  Last set of lipids: Lab Results  Component Value Date   CHOL 85 03/17/2024   HDL 42.50 03/17/2024   LDLCALC 29 03/17/2024   LDLDIRECT 163.5 05/16/2012   TRIG 68.0 03/17/2024   CHOLHDL 2 03/17/2024  Crestor , Lipitor, fluvastatin  XL caused joint and muscle aches.  Co-Q10 - restarted.  However, he then started on Crestor  5 mg daily and Zetia .  He had muscle cramps >> stopped Crestor , then restarted and increased to 20 mg.  He continues on Zetia  10 mg daily and Repatha  now.   -  last eye exam was: 11/01/2023: No DR, prev.+DR. He sees Dr. Patrcia.  - + some numbness and tingling in feet.  Last foot exam: 07/29/2023 here in the office.  He previously saw Dr. Verta. He had low back pain, and is now s/p back sx. >> no pain anymore but, some pain/cramping in his legs - plantar fasciitis - podiatrist gave him a steroid inj. Prev. On ALA, B complex.  In 11/2022,  he was in the emergency room with palpitations-found to have ventricular bigeminy.  His wife and daughter moved here from [place] in 09/2019.  ROS: + see HPI  I reviewed pt's medications, allergies, PMH, social hx, family hx, and changes were documented in the history of present illness. Otherwise, unchanged from my initial visit note.  Past Medical History:  Diagnosis Date   Clotting disorder    DVT   Diabetes mellitus    DVT (deep venous thrombosis) (HCC)    Family history of polyps in the colon    Fatty liver    GERD (gastroesophageal reflux disease)    History of chicken pox    Hyperlipidemia    no meds now 05-28-16   Obesity    Sleep apnea    not using CPAP at this time 05-28-16   Past  Surgical History:  Procedure Laterality Date   COLONOSCOPY     2012, 2017   LEFT HEART CATH AND CORONARY ANGIOGRAPHY N/A 01/14/2023   Procedure: LEFT HEART CATH AND CORONARY ANGIOGRAPHY;  Surgeon: [place], Peter M, MD;  Location: [facility] INVASIVE CV LAB;  Service: Cardiovascular;  Laterality: N/A;   LUMBAR DISC SURGERY     L4/L5   PILONIDAL CYST EXCISION     2001   Social History   Socioeconomic History   Marital status: Married    Spouse name: Not on file   Number of children: 0   Years of education: Not on file   Highest education level: Not on file  Occupational History   Occupation: Editor, commissioning  Tobacco Use   Smoking status: Former    Current packs/day: 1.00    Average packs/day: 1 pack/day for 20.0 years (20.0 ttl pk-yrs)    Types: Cigarettes   Smokeless tobacco: Never   Tobacco comments:    12/03/2022-Patient smokes a pack daily  Substance and Sexual Activity   Alcohol use: Yes    Comment: very rare   Drug use: No   Sexual activity: Yes  Other Topics Concern   Not on file  Social History Narrative   [employer] grad   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, travels for work   Married 2019   1 stepdaughter   Social Drivers of Health   Financial Resource Strain: Patient Declined (03/16/2024)   Overall Financial Resource Strain (CARDIA)    Difficulty of Paying Living Expenses: Patient declined  Food Insecurity: Patient Declined (03/16/2024)   Hunger Vital Sign    Worried About Running Out of Food in the Last Year: Patient declined    Ran Out of Food in the Last Year: Patient declined  Transportation Needs: Patient Declined (03/16/2024)   PRAPARE - Administrator, Civil Service (Medical): Patient declined    Lack of Transportation (Non-Medical): Patient declined  Physical Activity: Unknown (03/16/2024)   Exercise Vital Sign    Days of Exercise per Week: Patient declined    Minutes of Exercise per Session: Not on file  Stress: Patient Declined  (03/16/2024)   [employer] of Occupational Health - Occupational Stress Questionnaire    Feeling of Stress : Patient declined  Social Connections: Unknown (03/16/2024)   Social Connection and Isolation Panel    Frequency of Communication with Friends and Family: Patient declined    Frequency of Social Gatherings with Friends and Family: Patient declined    Attends Religious Services: Patient declined    Database administrator or Organizations: Patient declined    Attends Banker Meetings: Not  on file    Marital Status: Married  Catering manager Violence: Not on file    Current Outpatient Medications on File Prior to Visit  Medication Sig Dispense Refill   amLODipine  (NORVASC ) 2.5 MG tablet TAKE ONE TABLET BY MOUTH ONCE A DAY 90 tablet 1   aspirin  EC 81 MG tablet Take 1 tablet (81 mg total) by mouth daily. Swallow whole. 90 tablet 3   Continuous Glucose Sensor (FREESTYLE LIBRE 3 PLUS SENSOR) MISC Inject 1 Device into the skin continuous. Change every 15 days 6 each 3   Continuous Glucose Sensor (FREESTYLE LIBRE 3 SENSOR) MISC 1 each by Does not apply route every 14 (fourteen) days. 6 each 3   dapagliflozin  propanediol (FARXIGA ) 10 MG TABS tablet TAKE ONE TABLET BY MOUTH ONCE A DAY BEFORE BREAKFAST 90 tablet 3   Evolocumab  (REPATHA  SURECLICK) 140 MG/ML SOAJ INJECT 140MG  INTO THE SKIN EVERY 14 DAYS 6 mL 1   ezetimibe  (ZETIA ) 10 MG tablet TAKE ONE TABLET BY MOUTH ONE TIME DAILY 90 tablet 1   famotidine  (PEPCID ) 20 MG tablet Take 1 tablet (20 mg total) by mouth 2 (two) times daily.     ibuprofen  (ADVIL ) 200 MG tablet Take 1-3 tablets (200-600 mg total) by mouth every 8 (eight) hours as needed (with food).     loratadine  (CLARITIN ) 10 MG tablet Take 1 tablet (10 mg total) by mouth daily. 30 tablet 12   metoprolol  tartrate (LOPRESSOR ) 25 MG tablet Take 1 tablet (25 mg total) by mouth 2 (two) times daily. 180 tablet 2   nitroGLYCERIN  (NITROSTAT ) 0.4 MG SL tablet Place 1 tablet  (0.4 mg total) under the tongue every 5 (five) minutes as needed for chest pain. Max 3 doses in 15 minutes. 25 tablet 6   ondansetron  (ZOFRAN -ODT) 4 MG disintegrating tablet Take 1 tablet (4 mg total) by mouth every 8 (eight) hours as needed. 20 tablet 0   rosuvastatin  (CRESTOR ) 20 MG tablet TAKE ONE TABLET BY MOUTH DAILY AT BEDTIME 90 tablet 3   tirzepatide  (MOUNJARO ) 10 MG/0.5ML Pen INJECT 0.5ML(10MG ) INTO THE SKIN ONCE A WEEK 6 mL 3   No current facility-administered medications on file prior to visit.   Allergies  Allergen Reactions   Other Anaphylaxis    Bee stings   Chantix [Varenicline Tartrate]     irritable   Januvia [Sitagliptin Phosphate]     Inc in LFTs. Occurred with janumet, but prev tolerated metformin  alone   Lipitor [Atorvastatin Calcium ]     myalgia   Family History  Problem Relation Age of Onset   Diabetes Mother    Stroke Mother    Diabetes Father    Prostate cancer Father        dx'd in his early 42s   Hyperlipidemia Brother    Colon cancer Paternal Uncle        dx'd near age 34   Diabetes Maternal Uncle    Esophageal cancer Neg Hx    Stomach cancer Neg Hx    Rectal cancer Neg Hx   Pt has FH of DM in Mother, father, M uncle.   PE: BP 120/62   Pulse 76   Ht 6' (1.829 m)   Wt 281 lb 12.8 oz (127.8 kg)   SpO2 98%   BMI 38.22 kg/m  Wt Readings from Last 10 Encounters:  08/03/24 281 lb 12.8 oz (127.8 kg)  07/28/24 270 lb (122.5 kg)  03/31/24 268 lb 3.2 oz (121.7 kg)  03/17/24 271 lb 6.4 oz (123.1  kg)  01/27/24 272 lb 12.8 oz (123.7 kg)  10/11/23 274 lb 9.6 oz (124.6 kg)  10/03/23 269 lb 12.8 oz (122.4 kg)  08/12/23 270 lb (122.5 kg)  07/29/23 270 lb (122.5 kg)  03/11/23 275 lb 12.8 oz (125.1 kg)   Constitutional: overweight, in NAD Eyes:  EOMI, no exophthalmos ENT: no neck masses, no cervical lymphadenopathy Cardiovascular: RRR, No MRG Respiratory: CTA B Musculoskeletal: no deformities Skin:no rashes Neurological: no tremor with  outstretched hands Diabetic Foot Exam - Simple   Simple Foot Form Diabetic Foot exam was performed with the following findings: Yes 08/03/2024  8:14 AM  Visual Inspection No deformities, no ulcerations, no other skin breakdown bilaterally: Yes Sensation Testing Intact to touch and monofilament testing bilaterally: Yes Pulse Check Posterior Tibialis and Dorsalis pulse intact bilaterally: Yes Comments R dorsum of the foot eschar (1 cm diameter) - after resection of a nevus by dermatology    ASSESSMENT: 1. DM2, now noninsulin-dependent, fairly well controlled, with complications - CAD - PN - DR  -He had a freestyle libre CGM in the past but this kept coming off -No personal history of pancreatitis or family history of medullary thyroid  cancer  2. HL  3. Obesity class II  PLAN:  1. Patient with longstanding, previously uncontrolled type 2 diabetes, with improvement in control before last visit.  At that time, HbA1c was 6.5%, which was slightly higher than before, however, upon review of his CGM, the predicted A1c would have been 5.5%.  Sugars were excellent but with many lows at all times per the latest CGM.  He did mention that when checking with his glucometer, blood glucose values were not that low.  He had decreased the dose of metformin  before last visit and we stopped this completely.  He was interested in coming off Farxiga  due to price but we discussed about the many cardiovascular and renal benefits along with weight loss.  He was getting Jardiance  with a coupon.  We continued this.  He felt that he was resistant to Mounjaro  effect but we could not increase the dose due to constipation.  He felt that Ozempic  worked better for him from the point of view of weight loss but he had more severe GERD with this.  He did not want to switch back.  We continued the same dose of Mounjaro  at that time. CGM interpretation: -At today's visit, we reviewed his CGM downloads: It appears that 85% of  values are in target range (goal >70%), while 15% are higher than 180 (goal <25%), and 0% are lower than 70 (goal <4%).  The calculated average blood sugar is 147.  The projected HbA1c for the next 3 months (GMI) is 6.8%. -Reviewing the CGM trends, sugars appear to be fluctuating mainly within the target range, but she does have hyperglycemic excursions throughout the day at night, especially in the last month.  For now, we discussed about trying to adjust diet but continue to do same regimen.  I have refilled his Mounjaro .  We did discuss about possibly having a 1 day water only fast a week I advised him to not take Farxiga  that day if he decides to do  do this. - I suggested to:  Patient Instructions  Please continue: - Farxiga  10 mg before breakfast - Mounjaro  10 mg weekly  Please return in 4-6 months.  - we checked his HbA1c: 6.6% (higher) - advised to check sugars at different times of the day - 4x a day, rotating check  times - advised for yearly eye exams >> he is UTD - he previously had some decrease sensation in his feet.  I recommended alpha-lipoic acid and B complex.  He started these but then came off.  He saw podiatry for plantar fasciitis.  He was given a steroid injection and was planning a brace.  He was planning to go back to orthopedics to see if his back pain could the culprit for his leg pain and muscle cramping.  We discussed that, especially in the setting of neuropathy, he may need neurology evaluation.  However, afterwards, he started on co-Q10 with some improvement in pain. - return to clinic in 4-6 months   2. HL - Latest lipid panel reviewed from 02/2024: All fractions at goal: Lab Results  Component Value Date   CHOL 85 03/17/2024   HDL 42.50 03/17/2024   LDLCALC 29 03/17/2024   LDLDIRECT 163.5 05/16/2012   TRIG 68.0 03/17/2024   CHOLHDL 2 03/17/2024  -He had joint pains from statins, including from fluvastatin  XL.  He then started on Crestor  5 mg daily, which he  tolerated fairly well, but then had to come off due to muscle cramps.  Muscle cramps continued even after stopping Crestor .  He is currently on Crestor  20 mg daily, Zetia  10 mg daily and Repatha .  He continues to have some muscle aches.  He is managed by cardiology.  3.  Obesity class II - will continue the SGLT2 inhibitor and GLP-1/GIP receptor agonist, which should also help with weight loss - He gained 2 pounds before last visit, previously lost 12 - Since last visit, he gained 9 pounds.  Lela Fendt, MD PhD [facility]

## 2024-08-03 NOTE — Patient Instructions (Signed)
 Please continue: - Farxiga  10 mg before breakfast - Mounjaro  10 mg weekly  Please return in 4-6 months.

## 2024-09-16 ENCOUNTER — Other Ambulatory Visit: Payer: Self-pay | Admitting: General Practice

## 2024-09-21 ENCOUNTER — Other Ambulatory Visit: Payer: Self-pay | Admitting: Pharmacist Clinician (PhC)/ Clinical Pharmacy Specialist

## 2024-09-21 DIAGNOSIS — I25118 Atherosclerotic heart disease of native coronary artery with other forms of angina pectoris: Secondary | ICD-10-CM

## 2024-09-21 DIAGNOSIS — E785 Hyperlipidemia, unspecified: Secondary | ICD-10-CM

## 2024-09-21 DIAGNOSIS — E119 Type 2 diabetes mellitus without complications: Secondary | ICD-10-CM

## 2024-09-21 MED ORDER — REPATHA SURECLICK 140 MG/ML ~~LOC~~ SOAJ: SUBCUTANEOUS | 0 refills | Status: DC

## 2024-09-22 MED ORDER — AMLODIPINE BESYLATE 2.5 MG PO TABS: 2.5000 mg | ORAL_TABLET | Freq: Every day | ORAL | 0 refills | Status: DC

## 2024-09-22 MED ORDER — EZETIMIBE 10 MG PO TABS: 10.0000 mg | ORAL_TABLET | Freq: Every day | ORAL | 0 refills | Status: DC

## 2024-09-26 ENCOUNTER — Other Ambulatory Visit: Payer: Self-pay | Admitting: Internal Medicine

## 2024-10-18 ENCOUNTER — Other Ambulatory Visit: Payer: Self-pay | Admitting: Physician Assistant

## 2024-10-28 ENCOUNTER — Other Ambulatory Visit: Payer: Self-pay | Admitting: General Practice

## 2024-10-28 DIAGNOSIS — E785 Hyperlipidemia, unspecified: Secondary | ICD-10-CM

## 2024-10-28 DIAGNOSIS — I25118 Atherosclerotic heart disease of native coronary artery with other forms of angina pectoris: Secondary | ICD-10-CM

## 2024-10-28 DIAGNOSIS — E119 Type 2 diabetes mellitus without complications: Secondary | ICD-10-CM

## 2024-11-16 ENCOUNTER — Ambulatory Visit: Admitting: Cardiology

## 2024-11-17 ENCOUNTER — Other Ambulatory Visit: Payer: Self-pay

## 2024-11-17 MED ORDER — DAPAGLIFLOZIN PROPANEDIOL 10 MG PO TABS: 10.0000 mg | ORAL_TABLET | Freq: Every day | ORAL | 3 refills | Status: AC

## 2024-11-25 ENCOUNTER — Other Ambulatory Visit: Payer: Self-pay | Admitting: Physician Assistant

## 2024-11-25 DIAGNOSIS — E119 Type 2 diabetes mellitus without complications: Secondary | ICD-10-CM

## 2024-11-25 DIAGNOSIS — E785 Hyperlipidemia, unspecified: Secondary | ICD-10-CM

## 2024-11-25 DIAGNOSIS — I25118 Atherosclerotic heart disease of native coronary artery with other forms of angina pectoris: Secondary | ICD-10-CM

## 2024-11-26 NOTE — Telephone Encounter (Signed)
 Pt has pending appt on 4/15 w Dr Anner

## 2025-02-01 ENCOUNTER — Ambulatory Visit: Admitting: Internal Medicine

## 2025-02-03 ENCOUNTER — Ambulatory Visit: Admitting: Cardiology
